# Patient Record
Sex: Male | Born: 1950 | Hispanic: No | Smoking: Former smoker
Health system: Southern US, Community
[De-identification: ages and names within clinical notes are randomized; demographics above are authoritative.]

## PROBLEM LIST (undated history)

## (undated) DIAGNOSIS — I10 Essential (primary) hypertension: Secondary | ICD-10-CM

## (undated) DIAGNOSIS — R569 Unspecified convulsions: Secondary | ICD-10-CM

## (undated) DIAGNOSIS — K219 Gastro-esophageal reflux disease without esophagitis: Secondary | ICD-10-CM

## (undated) DIAGNOSIS — K635 Polyp of colon: Secondary | ICD-10-CM

## (undated) DIAGNOSIS — K5730 Diverticulosis of large intestine without perforation or abscess without bleeding: Secondary | ICD-10-CM

## (undated) HISTORY — DX: Diverticulosis of large intestine without perforation or abscess without bleeding: K57.30

## (undated) HISTORY — DX: Polyp of colon: K63.5

---

## 2007-08-07 ENCOUNTER — Emergency Department (HOSPITAL_COMMUNITY): Admission: EM | Admit: 2007-08-07 | Discharge: 2007-08-08 | Payer: Self-pay | Admitting: Emergency Medicine

## 2009-04-29 ENCOUNTER — Emergency Department (HOSPITAL_COMMUNITY): Admission: EM | Admit: 2009-04-29 | Discharge: 2009-04-30 | Payer: Self-pay | Admitting: Emergency Medicine

## 2009-05-15 ENCOUNTER — Emergency Department (HOSPITAL_COMMUNITY): Admission: EM | Admit: 2009-05-15 | Discharge: 2009-05-15 | Payer: Self-pay | Admitting: Emergency Medicine

## 2010-09-04 ENCOUNTER — Emergency Department (HOSPITAL_COMMUNITY): Payer: Self-pay

## 2010-09-04 ENCOUNTER — Emergency Department (HOSPITAL_COMMUNITY)
Admission: EM | Admit: 2010-09-04 | Discharge: 2010-09-04 | Disposition: A | Discharge status: Home / Self Care | Payer: Self-pay | Attending: Emergency Medicine | Admitting: Emergency Medicine

## 2010-09-04 DIAGNOSIS — R569 Unspecified convulsions: Secondary | ICD-10-CM | POA: Insufficient documentation

## 2010-09-04 DIAGNOSIS — F29 Unspecified psychosis not due to a substance or known physiological condition: Secondary | ICD-10-CM | POA: Insufficient documentation

## 2010-09-04 DIAGNOSIS — I498 Other specified cardiac arrhythmias: Secondary | ICD-10-CM | POA: Insufficient documentation

## 2010-09-04 LAB — COMPREHENSIVE METABOLIC PANEL
AST: 31 U/L (ref 0–37)
Albumin: 3.8 g/dL (ref 3.5–5.2)
Creatinine, Ser: 0.71 mg/dL (ref 0.50–1.35)
GFR calc non Af Amer: >60 mL/min (ref >60)
Glucose, Bld: 120 mg/dL — ABNORMAL HIGH (ref 70–99)
Potassium: 3.9 mEq/L (ref 3.5–5.1)
Sodium: 140 mEq/L (ref 135–145)
Total Bilirubin: 0.3 mg/dL (ref 0.3–1.2)
Total Protein: 7.5 g/dL (ref 6.0–8.3)

## 2010-09-04 LAB — DIFFERENTIAL
Basophils Absolute: 0 10*3/uL (ref 0.0–0.1)
Basophils Relative: 0 % (ref 0–1)
Lymphocytes Relative: 26 % (ref 12–46)
Monocytes Relative: 7 % (ref 3–12)
Neutro Abs: 5.4 10*3/uL (ref 1.7–7.7)
Neutrophils Relative %: 59 % (ref 43–77)

## 2010-09-04 LAB — CBC
Hemoglobin: 12.6 g/dL — ABNORMAL LOW (ref 13.0–17.0)
MCH: 31.4 pg (ref 26.0–34.0)
MCHC: 34.6 g/dL (ref 30.0–36.0)
MCV: 90.8 fL (ref 78.0–100.0)
Platelets: 320 10*3/uL (ref 150–400)
RBC: 4.01 MIL/uL — ABNORMAL LOW (ref 4.22–5.81)

## 2010-09-04 LAB — GLUCOSE, CAPILLARY

## 2010-12-12 LAB — DIFFERENTIAL
Basophils Absolute: 0
Basophils Relative: 0
Eosinophils Absolute: 0.3
Lymphocytes Relative: 44
Neutro Abs: 2.9
Neutrophils Relative %: 45

## 2010-12-12 LAB — RAPID URINE DRUG SCREEN, HOSP PERFORMED
Amphetamines: NOT DETECTED
Benzodiazepines: NOT DETECTED
Cocaine: NOT DETECTED
Opiates: NOT DETECTED
Tetrahydrocannabinol: NOT DETECTED

## 2010-12-12 LAB — CBC
HCT: 39.2
MCHC: 34
MCV: 92.9
RBC: 4.22
WBC: 6.6

## 2010-12-12 LAB — POCT I-STAT, CHEM 8
BUN: 9
Creatinine, Ser: 1
Hemoglobin: 14.3
Potassium: 3.9
Sodium: 145

## 2011-05-02 ENCOUNTER — Encounter (HOSPITAL_COMMUNITY): Payer: Self-pay | Admitting: Emergency Medicine

## 2011-05-02 ENCOUNTER — Emergency Department (HOSPITAL_COMMUNITY): Payer: Self-pay

## 2011-05-02 ENCOUNTER — Other Ambulatory Visit: Payer: Self-pay

## 2011-05-02 ENCOUNTER — Emergency Department (HOSPITAL_COMMUNITY)
Admission: EM | Admit: 2011-05-02 | Discharge: 2011-05-03 | Disposition: A | Discharge status: Home / Self Care | Payer: Self-pay | Attending: Emergency Medicine | Admitting: Emergency Medicine

## 2011-05-02 DIAGNOSIS — R42 Dizziness and giddiness: Secondary | ICD-10-CM | POA: Insufficient documentation

## 2011-05-02 DIAGNOSIS — K625 Hemorrhage of anus and rectum: Secondary | ICD-10-CM | POA: Insufficient documentation

## 2011-05-02 DIAGNOSIS — R1011 Right upper quadrant pain: Secondary | ICD-10-CM | POA: Insufficient documentation

## 2011-05-02 DIAGNOSIS — D649 Anemia, unspecified: Secondary | ICD-10-CM | POA: Insufficient documentation

## 2011-05-02 LAB — DIFFERENTIAL
Basophils Absolute: 0 10*3/uL (ref 0.0–0.1)
Eosinophils Relative: 3 % (ref 0–5)
Lymphocytes Relative: 29 % (ref 12–46)
Lymphs Abs: 2.1 10*3/uL (ref 0.7–4.0)
Monocytes Relative: 4 % (ref 3–12)
Neutro Abs: 4.5 10*3/uL (ref 1.7–7.7)

## 2011-05-02 LAB — CBC
MCHC: 34.5 g/dL (ref 30.0–36.0)
MCV: 89.8 fL (ref 78.0–100.0)
Platelets: 247 10*3/uL (ref 150–400)
RBC: 1.87 MIL/uL — ABNORMAL LOW (ref 4.22–5.81)
WBC: 7.2 10*3/uL (ref 4.0–10.5)

## 2011-05-02 LAB — COMPREHENSIVE METABOLIC PANEL
ALT: 8 U/L (ref 0–53)
AST: 11 U/L (ref 0–37)
Albumin: 3.2 g/dL — ABNORMAL LOW (ref 3.5–5.2)
Alkaline Phosphatase: 56 U/L (ref 39–117)
BUN: 18 mg/dL (ref 6–23)
Chloride: 105 mEq/L (ref 96–112)
Potassium: 3.6 mEq/L (ref 3.5–5.1)
Total Bilirubin: 0.2 mg/dL — ABNORMAL LOW (ref 0.3–1.2)

## 2011-05-02 LAB — PROTIME-INR: INR: 1.05 (ref 0.00–1.49)

## 2011-05-02 MED ORDER — SODIUM CHLORIDE 0.9 % IV SOLN
Freq: Once | INTRAVENOUS | Status: AC
  Administered 2011-05-02: 20 mL/h via INTRAVENOUS

## 2011-05-02 MED ORDER — IOHEXOL 300 MG/ML  SOLN
100.0000 mL | Freq: Once | INTRAMUSCULAR | Status: AC | PRN
  Administered 2011-05-02: 100 mL via INTRAVENOUS

## 2011-05-02 NOTE — ED Notes (Signed)
IV team paged.  

## 2011-05-02 NOTE — ED Notes (Signed)
Pt here for rectal bleeding and abd pain starting yesterday; pt poor historian; pt sts some dizziness and appears pale

## 2011-05-02 NOTE — ED Provider Notes (Signed)
History     CSN: [id number]  Arrival date & time 05/02/11  1759   First MD Initiated Contact with Patient 05/02/11 1936      Chief Complaint  Patient presents with  . Rectal Bleeding  . Dizziness    (Consider location/radiation/quality/duration/timing/severity/associated sxs/prior treatment) HPI Comments: Patient states he developed left lower abdominal pain and bloody bowel movements for the past 2, days.  He's been feeling dizzy.  I talked with him extensively about transfusion.  He adamantly states, "I don't want any foreign blood in me"  Patient is a 61 y.o. male presenting with hematochezia. The history is provided by the patient.  Rectal Bleeding  The current episode started 2 days ago. The problem occurs frequently. The problem has been unchanged. The pain is moderate. The stool is described as bloody. Associated symptoms include abdominal pain. Pertinent negatives include no fever, no diarrhea, no nausea, no rectal pain, no vomiting, no chest pain and no headaches.    History reviewed. No pertinent past medical history.  History reviewed. No pertinent past surgical history.  History reviewed. No pertinent family history.  History  Substance Use Topics  . Smoking status: Not on file  . Smokeless tobacco: Not on file  . Alcohol Use: Not on file      Review of Systems  Constitutional: Negative for fever.  Respiratory: Negative for shortness of breath.   Cardiovascular: Negative for chest pain.  Gastrointestinal: Positive for abdominal pain, blood in stool and hematochezia. Negative for nausea, vomiting, diarrhea, abdominal distention and rectal pain.  Genitourinary: Negative for dysuria.  Skin: Positive for pallor.  Neurological: Positive for dizziness. Negative for headaches.    Allergies  Review of patient's allergies indicates no known allergies.  Home Medications  No current outpatient prescriptions on file.  BP 143/75  Pulse 94  Temp(Src) 98 F (36.7  C) (Oral)  Resp 16  SpO2 100%  Physical Exam  Constitutional: He is oriented to person, place, and time. He appears well-developed and well-nourished.  HENT:  Head: Normocephalic.  Eyes: Pupils are equal, round, and reactive to light.       Pale conjuntiva  Neck: Normal range of motion.  Cardiovascular: Normal rate.   Pulmonary/Chest: Effort normal.  Abdominal: Soft. Bowel sounds are normal. He exhibits no distension and no pulsatile liver. There is no hepatosplenomegaly. There is tenderness in the left lower quadrant. There is no rigidity, no rebound and no CVA tenderness.  Musculoskeletal: Normal range of motion.  Neurological: He is alert and oriented to person, place, and time.  Skin: Skin is warm. There is pallor.       Pale palms    ED Course  Procedures (including critical care time)  Labs Reviewed  CBC - Abnormal; Notable for the following:    RBC 1.87 (*)    Hemoglobin 5.8 (*)    HCT 16.8 (*)    All other components within normal limits  COMPREHENSIVE METABOLIC PANEL - Abnormal; Notable for the following:    Glucose, Bld 180 (*)    Total Protein 5.8 (*)    Albumin 3.2 (*)    Total Bilirubin 0.2 (*)    All other components within normal limits  DIFFERENTIAL  PROTIME-INR  TYPE AND SCREEN  OCCULT BLOOD, POC DEVICE  ABO/RH   Ct Abdomen Pelvis W Contrast  05/02/2011  *RADIOLOGY REPORT*  Clinical Data: Rectal bleeding.  Abdominal pain.  Anemia.  CT ABDOMEN AND PELVIS WITH CONTRAST  Technique:  Multidetector CT imaging of  the abdomen and pelvis was performed following the standard protocol during bolus administration of intravenous contrast.  Contrast: OMNIPAQUE IOHEXOL 300 MG/ML IV SOLN  Comparison: None.  Findings: The lung bases are clear without focal nodule, mass, or airspace disease.  Heart size is normal.  No significant pleural or pericardial effusion is present.  The liver and spleen are within normal limits.  The stomach, duodenum, and pancreas are normal.   The common bile duct and gallbladder are within normal limits. The adrenal glands are normal bilaterally.  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 Bilateral simple cysts are present in the kidneys.  The ureters are within normal limits throughout their course to the urinary bladder.  The wall of the urinary bladder appears diffusely thickened.  No discrete mass lesion is evident.  The rectum is unremarkable.  No significant adenopathy is present. The sigmoid colon is within normal limits.  The remainder of the colon is within normal limits as well.  The appendix is visualized and normal.  The small bowel is unremarkable.  No significant adenopathy or free fluid is present.  Minimal atherosclerotic calcifications are noted.  The bone windows are within normal limits.  IMPRESSION:  1.  No significant rectal mass or adenopathy is present. 2.  Diffuse thickening of the bladder wall.  This could be seen with in the setting of cystitis or urinary tract infection. 3.  Bilateral renal cysts. 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  Original Report Authenticated By: Jamesetta Orleans. MATTERN, M.D.   Discussed at length.  Patient's need for blood transfusion.  He adamantly refuses again, stating he does not want any foreign or blood in his body.  Explained that he could die if he leaves and doesn't get treatment.  He refuses to be admitted stating that he will come back if he feels bad  1. Rectal bleeding   2. Anemia        MDM   Rectal bleeding       Arman Filter, NP 05/02/11 2341  Arman Filter, NP 05/02/11 2343  Arman Filter, NP 05/02/11 [phone]

## 2011-05-02 NOTE — ED Notes (Signed)
RN spoke to pt about receiving a blood infusion because of critically low hemoglobin and hematocrit. Pt still refusing blood and additional treatment, states he wants to leave AMA. RN warned patient of the possible medical consequences including death, pt states he understands and still wants to leave.

## 2011-05-02 NOTE — Discharge Instructions (Signed)
Anemia, Nonspecific Your exam and blood tests show you are anemic. This means your blood (hemoglobin) level is low. Normal hemoglobin values are 12 to 15 g/dL for females and 14 to 17 g/dL for males. Make a note of your hemoglobin level today. The hematocrit percent is also used to measure anemia. A normal hematocrit is 38% to 46% in females and 42% to 49% in males. Make a note of your hematocrit level today. CAUSES  Anemia can be due to many different causes.  Excessive bleeding from periods (in women).   Intestinal bleeding.   Poor nutrition.   Kidney, thyroid, liver, and bone marrow diseases.  SYMPTOMS  Anemia can come on suddenly (acute). It can also come on slowly. Symptoms can include:  Minor weakness.   Dizziness.   Palpitations.   Shortness of breath.  Symptoms may be absent until half your hemoglobin is missing if it comes on slowly. Anemia due to acute blood loss from an injury or internal bleeding may require blood transfusion if the loss is severe. Hospital care is needed if you are anemic and there is significant continual blood loss. TREATMENT   Stool tests for blood (Hemoccult) and additional lab tests are often needed. This determines the best treatment.   Further checking on your condition and your response to treatment is very important. It often takes many weeks to correct anemia.  Depending on the cause, treatment can include:  Supplements of iron.   Vitamins B12 and folic acid.   Hormone medicines.If your anemia is due to bleeding, finding the cause of the blood loss is very important. This will help avoid further problems.  SEEK IMMEDIATE MEDICAL CARE IF:   You develop fainting, extreme weakness, shortness of breath, or chest pain.   You develop heavy vaginal bleeding.   You develop bloody or black, tarry stools or vomit up blood.   You develop a high fever, rash, repeated vomiting, or dehydration.  Document Released: 04/11/2004 Document Revised:  11/14/2010 Document Reviewed: 01/17/2009 [facility] Patient Information 2012 [place], [place].Bloody Stools Bloody stools means there is blood in your poop (stool). It is a sign that there is a problem somewhere in the digestive system. It is important for your doctor to find the cause of your bleeding, so the problem can be treated.  HOME CARE  Only take medicine as told by your doctor.   Eat foods with fiber (prunes, bran cereals).   Drink enough fluids to keep your pee (urine) clear or pale yellow.   Sit in warm water (sitz bath) for 10 to 15 minutes as told by your doctor.   Know how to take your medicines (enemas, suppositories) if advised by your doctor.   Watch for signs that you are getting better or getting worse.  GET HELP RIGHT AWAY IF:   You are not getting better.   You start to get better but then get worse again.   You have new problems.   You have severe bleeding from the place where poop comes out (rectum) that does not stop.   You throw up (vomit) blood.   You feel weak or pass out (faint).   You have a fever.  MAKE SURE YOU:   Understand these instructions.   Will watch your condition.   Will get help right away if you are not doing well or get worse.  Document Released: 02/20/2009 Document Revised: 11/14/2010 Document Reviewed: 07/20/2010 [facility] Patient Information 2012 [place], [place].  please return at any time if  he becomes dizzy, weak and fainting spells, change her mind about receiving a blood transfusion

## 2011-05-03 ENCOUNTER — Other Ambulatory Visit: Payer: Self-pay

## 2011-05-03 ENCOUNTER — Encounter (HOSPITAL_COMMUNITY): Payer: Self-pay

## 2011-05-03 ENCOUNTER — Inpatient Hospital Stay (HOSPITAL_COMMUNITY)
Admission: EM | Admit: 2011-05-03 | Discharge: 2011-05-07 | DRG: 811 | Disposition: A | Discharge status: Home / Self Care | Payer: Self-pay | Attending: Internal Medicine | Admitting: Internal Medicine

## 2011-05-03 ENCOUNTER — Emergency Department (HOSPITAL_COMMUNITY): Payer: Self-pay

## 2011-05-03 DIAGNOSIS — R079 Chest pain, unspecified: Secondary | ICD-10-CM | POA: Diagnosis present

## 2011-05-03 DIAGNOSIS — R0789 Other chest pain: Secondary | ICD-10-CM | POA: Diagnosis present

## 2011-05-03 DIAGNOSIS — K5731 Diverticulosis of large intestine without perforation or abscess with bleeding: Secondary | ICD-10-CM | POA: Diagnosis present

## 2011-05-03 DIAGNOSIS — R569 Unspecified convulsions: Secondary | ICD-10-CM | POA: Diagnosis present

## 2011-05-03 DIAGNOSIS — K644 Residual hemorrhoidal skin tags: Secondary | ICD-10-CM | POA: Diagnosis present

## 2011-05-03 DIAGNOSIS — K922 Gastrointestinal hemorrhage, unspecified: Secondary | ICD-10-CM | POA: Diagnosis present

## 2011-05-03 DIAGNOSIS — D72829 Elevated white blood cell count, unspecified: Secondary | ICD-10-CM | POA: Diagnosis present

## 2011-05-03 DIAGNOSIS — D126 Benign neoplasm of colon, unspecified: Secondary | ICD-10-CM | POA: Diagnosis present

## 2011-05-03 DIAGNOSIS — D62 Acute posthemorrhagic anemia: Principal | ICD-10-CM | POA: Diagnosis present

## 2011-05-03 DIAGNOSIS — R000 Tachycardia, unspecified: Secondary | ICD-10-CM | POA: Diagnosis present

## 2011-05-03 DIAGNOSIS — I10 Essential (primary) hypertension: Secondary | ICD-10-CM | POA: Diagnosis present

## 2011-05-03 DIAGNOSIS — K648 Other hemorrhoids: Secondary | ICD-10-CM | POA: Diagnosis present

## 2011-05-03 HISTORY — DX: Unspecified convulsions: R56.9

## 2011-05-03 LAB — TYPE AND SCREEN
ABO/RH(D): A POS
Antibody Screen: NEGATIVE
Unit division: 0

## 2011-05-03 LAB — CBC
HCT: 16.3 % — ABNORMAL LOW (ref 39.0–52.0)
Hemoglobin: 5.7 g/dL — CL (ref 13.0–17.0)
MCH: 31.7 pg (ref 26.0–34.0)
MCHC: 35.3 g/dL (ref 30.0–36.0)
MCV: 90.6 fL (ref 78.0–100.0)
Platelets: 298 10*3/uL (ref 150–400)
RBC: 1.8 MIL/uL — ABNORMAL LOW (ref 4.22–5.81)
RDW: 13.4 % (ref 11.5–15.5)
WBC: 16.4 10*3/uL — ABNORMAL HIGH (ref 4.0–10.5)

## 2011-05-03 LAB — BASIC METABOLIC PANEL
BUN: 15 mg/dL (ref 6–23)
Creatinine, Ser: 0.9 mg/dL (ref 0.50–1.35)
GFR calc non Af Amer: 89 mL/min — ABNORMAL LOW (ref >90)
Glucose, Bld: 146 mg/dL — ABNORMAL HIGH (ref 70–99)
Potassium: 3.5 mEq/L (ref 3.5–5.1)

## 2011-05-03 LAB — COMPREHENSIVE METABOLIC PANEL
ALT: 10 U/L (ref 0–53)
Albumin: 3.4 g/dL — ABNORMAL LOW (ref 3.5–5.2)
Alkaline Phosphatase: 58 U/L (ref 39–117)
Chloride: 98 mEq/L (ref 96–112)
Glucose, Bld: 137 mg/dL — ABNORMAL HIGH (ref 70–99)
Potassium: 3.4 mEq/L — ABNORMAL LOW (ref 3.5–5.1)
Sodium: 134 mEq/L — ABNORMAL LOW (ref 135–145)
Total Bilirubin: 0.1 mg/dL — ABNORMAL LOW (ref 0.3–1.2)
Total Protein: 6.2 g/dL (ref 6.0–8.3)

## 2011-05-03 LAB — DIFFERENTIAL
Basophils Absolute: 0 10*3/uL (ref 0.0–0.1)
Basophils Relative: 0 % (ref 0–1)
Eosinophils Absolute: 0 10*3/uL (ref 0.0–0.7)
Eosinophils Relative: 0 % (ref 0–5)
Lymphs Abs: 1.8 10*3/uL (ref 0.7–4.0)
Monocytes Absolute: 0.8 10*3/uL (ref 0.1–1.0)
Monocytes Absolute: 0.9 10*3/uL (ref 0.1–1.0)
Monocytes Relative: 5 % (ref 3–12)
Neutro Abs: 14.2 10*3/uL — ABNORMAL HIGH (ref 1.7–7.7)
Neutrophils Relative %: 82 % — ABNORMAL HIGH (ref 43–77)
Neutrophils Relative %: 87 % — ABNORMAL HIGH (ref 43–77)

## 2011-05-03 LAB — PROTIME-INR
INR: 1.06 (ref 0.00–1.49)
Prothrombin Time: 14 seconds (ref 11.6–15.2)

## 2011-05-03 LAB — MRSA PCR SCREENING: MRSA by PCR: NEGATIVE

## 2011-05-03 LAB — APTT: aPTT: 26 seconds (ref 24–37)

## 2011-05-03 LAB — CARDIAC PANEL(CRET KIN+CKTOT+MB+TROPI): Troponin I: <0.3 ng/mL (ref <0.30)

## 2011-05-03 MED ORDER — PANTOPRAZOLE SODIUM 40 MG IV SOLR
40.0000 mg | Freq: Two times a day (BID) | INTRAVENOUS | Status: DC
  Administered 2011-05-03 – 2011-05-06 (×6): 40 mg via INTRAVENOUS
  Filled 2011-05-03 (×9): qty 40

## 2011-05-03 MED ORDER — LORAZEPAM 2 MG/ML IJ SOLN: 1.0000 mg | Freq: Four times a day (QID) | INTRAMUSCULAR | Status: DC | PRN

## 2011-05-03 MED ORDER — SODIUM CHLORIDE 0.9 % IV SOLN
INTRAVENOUS | Status: DC
  Administered 2011-05-03 (×2): via INTRAVENOUS
  Administered 2011-05-05: 1000 mL via INTRAVENOUS
  Administered 2011-05-06: 50 mL/h via INTRAVENOUS
  Administered 2011-05-06 (×2): 100 mL/h via INTRAVENOUS
  Administered 2011-05-07: 1000 mL via INTRAVENOUS

## 2011-05-03 MED ORDER — ACETAMINOPHEN 325 MG PO TABS
650.0000 mg | ORAL_TABLET | Freq: Four times a day (QID) | ORAL | Status: DC | PRN
  Administered 2011-05-03: 650 mg via ORAL
  Filled 2011-05-03: qty 2

## 2011-05-03 MED ORDER — SODIUM CHLORIDE 0.9 % IJ SOLN
3.0000 mL | Freq: Two times a day (BID) | INTRAMUSCULAR | Status: DC
  Administered 2011-05-03: 3 mL via INTRAVENOUS

## 2011-05-03 MED ORDER — CIPROFLOXACIN IN D5W 400 MG/200ML IV SOLN
400.0000 mg | Freq: Two times a day (BID) | INTRAVENOUS | Status: DC
  Administered 2011-05-03 – 2011-05-05 (×4): 400 mg via INTRAVENOUS
  Filled 2011-05-03 (×6): qty 200

## 2011-05-03 NOTE — ED Notes (Signed)
Unable to start IV. Pt can not follow commands at this time and jerks arm.

## 2011-05-03 NOTE — ED Notes (Signed)
Called [phone] to give report. Nurse unavailable. Awaiting return call.

## 2011-05-03 NOTE — ED Provider Notes (Signed)
Medical screening examination/treatment/procedure(s) were conducted as a shared visit with non-physician practitioner(s) and myself.  I personally evaluated the patient during the encounter  Pt seen and evaluted, pt presenting with abdominal pain and rectal bleeding- states this has been occurring x 3 days.  Pt found to be significantly anemic- long discussion with patient about need for blood transfusion and potentially life threatening nature of a GI bleed- pt initially refused CT scan, but then agreed- but on reassessment continued to refuse admission and PRBCs- he was signed out AMA and was encouraged to return to the ED if he changes his mind about treatment  Ethelda Chick, MD 05/03/11 [phone]

## 2011-05-03 NOTE — ED Notes (Signed)
Patient presents with initial request needing a transfusion. Per old chart it seemed that patient needed a blood transfusion but had left ama from the hospital. Nursing had called him multiple times in triage but was unable to locate him in the department. Pt was called for in triage and out in the general waiting area. No response. The patient had seated himself in the area for patients that had already been triaged. Nursing had called out for his name in this area but he did not respond to that call either. Nursing was triaging another patient and came out of the triage room to see that he was having what appeared to be a seizure. He was unresponsive and it seemed that he may have bitten his tongue. Pt had fallen onto the floor. He was picked up and moved onto a stretcher and then taken back to the trauma room. Nursing unable to fully triage him because he was unable to be located and then had a seizure and was unable to communicate with staff.

## 2011-05-03 NOTE — Progress Notes (Signed)
Called to ER 8 for iv start;  Staff has attempted x 3, per RN;  Very poor peripheral access!  Attempted x 1, also without success; suggest central access for this pt; RN and MD both aware;

## 2011-05-03 NOTE — Progress Notes (Signed)
Pt currently has no IV access. IV nurse notified and said that two IV nurses attempted to gain access in ED with no success. NP Lynch notified. Plan to place pt on PICC list. Will continue to monitor pt.

## 2011-05-03 NOTE — ED Notes (Signed)
MD attempted x 2 to place IV. Unsuccessful.

## 2011-05-03 NOTE — ED Notes (Signed)
Pt attempting to get up out of bed. Pt had disconnected BP cuff and heart monitor leads. Counselled pt on getting up unassisted. Explained importance of asking for staff assistance for help getting out of bed and also about not removing bp cuff and heart monitor.

## 2011-05-03 NOTE — ED Notes (Signed)
INT attempted x 2. Unsuccessful. Pt uncooperative with care at this time. IV team called.

## 2011-05-03 NOTE — ED Notes (Signed)
IV team at bedside. Unable to obtain IV accesss. Md aware.

## 2011-05-03 NOTE — ED Provider Notes (Signed)
History     CSN: [id number]  Arrival date & time 05/03/11  1516   First MD Initiated Contact with Patient 05/03/11 1609      Chief Complaint  Patient presents with  . Altered Mental Status    (Consider location/radiation/quality/duration/timing/severity/associated sxs/prior treatment) HPI Comments: 61yo male here with anemia and bloody stools. Was seen yesterday, found to have Hb of 5.8 and left AMA. Returned today requesting transfusion. Noted to have grand mal seizure in triage. Did have seizure in June 2012 at which time head CT was neg.   Patient is a 61 y.o. male presenting with seizures.  Seizures  This is a recurrent problem. The current episode started less than 1 hour ago. The problem has been gradually improving. There was 1 seizure. Duration: unknown, pt found in triage to be having seizure. Characteristics include rhythmic jerking and loss of consciousness. The episode was witnessed. The seizures did not continue in the ED. The seizure(s) had no focality. There has been no fever. Associated symptoms comments: Has had abdominal pain with GI bleeding for past two days according to records. Left AMA yesterday with Hb of 5.8. .    History reviewed. No pertinent past medical history.  History reviewed. No pertinent past surgical history.  History reviewed. No pertinent family history.  History  Substance Use Topics  . Smoking status: Not on file  . Smokeless tobacco: Not on file  . Alcohol Use: Not on file      Review of Systems  Unable to perform ROS: Mental status change  Neurological: Positive for seizures and loss of consciousness.    Allergies  Review of patient's allergies indicates no known allergies.  Home Medications  No current outpatient prescriptions on file.  BP 137/57  Pulse 111  Resp 28  SpO2 97%  Physical Exam  Nursing note and vitals reviewed. Constitutional: He appears well-developed and well-nourished. No distress.       Lying in bed  with eyes closed   HENT:  Head: Normocephalic and atraumatic.  Right Ear: External ear normal.  Left Ear: External ear normal.  Mouth/Throat: Oropharynx is clear and moist.       Small amount of dried blood at right edge of lip  Eyes: Pupils are equal, round, and reactive to light.  Neck: Neck supple.  Cardiovascular: Regular rhythm and intact distal pulses.  Exam reveals no gallop and no friction rub.   Murmur (II/VI SEM heard best at right sternal border) heard.      tachycardic   Pulmonary/Chest: Effort normal and breath sounds normal. No respiratory distress. He has no wheezes. He has no rales.  Abdominal: Soft. There is no tenderness. There is no rebound and no guarding.  Musculoskeletal: Normal range of motion. He exhibits no edema and no tenderness.  Lymphadenopathy:    He has no cervical adenopathy.  Neurological: He is alert. GCS eye subscore is 3. GCS verbal subscore is 1. GCS motor subscore is 4.       Opens eyes to voice. Does not follow commands.   Skin: Skin is warm and dry. No rash noted. No erythema.  Psychiatric: He has a normal mood and affect. His behavior is normal.    ED Course  Procedures (including critical care time) US guided IV: MD called to bedside due to inability to establish IV access. 20g IV placed in right basilic vein via US guidance. IV line drew back and flushed well. Patient tolerated procedure well.   Results for orders  placed during the hospital encounter of 05/03/11  CBC      Component Value Range   WBC 14.8 (*) 4.0 - 10.5 (K/uL)   RBC 1.80 (*) 4.22 - 5.81 (MIL/uL)   Hemoglobin 5.7 (*) 13.0 - 17.0 (g/dL)   HCT 16.1 (*) 09.6 - 52.0 (%)   MCV 90.6  78.0 - 100.0 (fL)   MCH 31.7  26.0 - 34.0 (pg)   MCHC 35.0  30.0 - 36.0 (g/dL)   RDW 04.5  40.9 - 81.1 (%)   Platelets 279  150 - 400 (K/uL)  DIFFERENTIAL      Component Value Range   Neutrophils Relative 82 (*) 43 - 77 (%)   Neutro Abs 12.2 (*) 1.7 - 7.7 (K/uL)   Lymphocytes Relative 12  12  - 46 (%)   Lymphs Abs 1.8  0.7 - 4.0 (K/uL)   Monocytes Relative 5  3 - 12 (%)   Monocytes Absolute 0.8  0.1 - 1.0 (K/uL)   Eosinophils Relative 0  0 - 5 (%)   Eosinophils Absolute 0.0  0.0 - 0.7 (K/uL)   Basophils Relative 0  0 - 1 (%)   Basophils Absolute 0.0  0.0 - 0.1 (K/uL)  BASIC METABOLIC PANEL      Component Value Range   Sodium 137  135 - 145 (mEq/L)   Potassium 3.5  3.5 - 5.1 (mEq/L)   Chloride 100  96 - 112 (mEq/L)   CO2 19  19 - 32 (mEq/L)   Glucose, Bld 146 (*) 70 - 99 (mg/dL)   BUN 15  6 - 23 (mg/dL)   Creatinine, Ser 9.14  0.50 - 1.35 (mg/dL)   Calcium 8.6  8.4 - 78.2 (mg/dL)   GFR calc non Af Amer 89 (*) >90 (mL/min)   GFR calc Af Amer >90  >90 (mL/min)  PREPARE RBC (CROSSMATCH)      Component Value Range   Order Confirmation ORDER PROCESSED BY BLOOD BANK    GLUCOSE, CAPILLARY      Component Value Range   Glucose-Capillary 139 (*) 70 - 99 (mg/dL)  TYPE AND SCREEN      Component Value Range   ABO/RH(D) A POS     Antibody Screen NEG     Sample Expiration 05/06/2011     Unit Number [id number]     Blood Component Type RED CELLS,LR     Unit division 00     Status of Unit ISSUED     Transfusion Status OK TO TRANSFUSE     Crossmatch Result Compatible  Ct Abdomen Pelvis W Contrast  05/02/2011  *RADIOLOGY REPORT*  Clinical Data: Rectal bleeding.  Abdominal pain.  Anemia.  CT ABDOMEN AND PELVIS WITH CONTRAST  Technique:  Multidetector CT imaging of the abdomen and pelvis was performed following the standard protocol during bolus administration of intravenous contrast.  Contrast: OMNIPAQUE IOHEXOL 300 MG/ML IV  SOLN  Comparison: None.  Findings: The lung bases are clear without focal nodule, mass, or airspace disease.  Heart size is normal.  No significant pleural or pericardial effusion is present.  The liver and spleen are within normal limits.  The stomach, duodenum, and pancreas are normal.  The common bile duct and gallbladder are within normal limits. The adrenal glands are normal bilaterally.  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 Bilateral simple cysts are present in the kidneys.  The ureters are within normal limits throughout their course to the urinary bladder.  The wall of the urinary bladder appears diffusely thickened.  No discrete mass lesion is evident.  The rectum is unremarkable.  No significant adenopathy is present. The sigmoid colon is within normal limits.  The remainder of the colon is within normal limits as well.  The appendix is visualized and normal.  The small bowel is unremarkable.  No significant adenopathy or free fluid is present.  Minimal atherosclerotic calcifications are noted.  The bone windows are within normal limits.  IMPRESSION:  1.  No significant rectal mass or adenopathy is present. 2.  Diffuse thickening of the bladder wall.  This could be seen with in the setting of cystitis or urinary tract infection. 3.  Bilateral renal cysts. 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  Original Report Authenticated By: Jamesetta Orleans. MATTERN, M.D.    Date: 05/04/2011  Rate: 108  Rhythm: normal sinus rhythm  QRS Axis: normal  Intervals: normal  ST/T Wave abnormalities: normal  Conduction Disutrbances:none  Narrative Interpretation:       1. Chest pain   2. Anemia due to acute blood loss    MDM  18:35 PM 61 year old male, it was seen here yesterday with GI bleed and anemia with a hemoglobin of 5.8 initial presenting needing transfusion. Patient did have a grand mal seizure while in the waiting room. He is currently post ictal period he had a CT scan done  yesterday which shows a possible cystitis that was otherwise unremarkable. At this time he refused blood transfusion and left AMA. He apparently presented with abdominal pain for 2 days' duration with bloody stools and weakness. Is tachycardic to 120 the blood pressure is stable. He opened his eyes to voice but does not follow any commands. We'll recheck CBC BMP and arrange for blood transfusion.  US guided IV placed by MD. Pt still anemic at 5.7. He has remained HDS and has recovered from his seizure. No AAOx3 and interactive. Noted to have seizure in June 2012 at which time he had CT head which was negative. Neuro exam is now non focal. Transfusion ordered and medicine consulted. GI also contacted who will follow patient. Pt admitted for further management.        Sheran Luz, MD 05/04/11 0981  Sheran Luz, MD 05/04/11 [phone]

## 2011-05-03 NOTE — ED Notes (Signed)
Pt brought back to room 17 after witness grand mal seizure in waiting room. Pt is postictal at this time. Pt placed on monitor. Airway intact.

## 2011-05-03 NOTE — H&P (Addendum)
PCP:  No primary provider on file.   DOA:  05/03/2011  3:51 PM  Chief Complaint:  Anemia, chest pain  HPI: Patient is a pleasant 61 year old male with no significant past medical history, was just recently in emergency room (05/02/2011) and was found with a hemoglobin of 5.8 however before transfusion was started patient's left hospital AGAINST MEDICAL ADVICE. He now comes back with complaints of chest pain, left-sided and right-sided, 8/10 in intensity, at rest, not radiating. Patient reports that this pain started on the morning of admission to emergency room. Patient also reports that chest pain is aggravated by breathing and there are no alleviating factors. Patient reports no shortness of breath, no fevers or chills at home. As per patient he had bright red blood per rectum on Monday (04/29/2011) and Wednesday (05/01/11) but hasn't had any bleeding episode since then. He also reports that he felt dizzy at the time of bleed but at present he has no such complaints. There are no complaints of abdominal pain, nausea, vomiting. As per patient he has never had a colonoscopy in the past While patient was in triage he has had one episode of tonic-clonic seizure, unknown duration as per ED physician. Patient had no tongue biting or urinary incontinence. He was post ictal at that time ED physician saw him but at present patient is alert, awake and oriented.  Assessment and plan  Principal Problem:   *Acute blood loss anemia - Likely secondary to lower GI bleed - Hemoglobin on admission 5.7 - Patient will receive 2 units of PRBC in emergency room - Followup posttransfusion CBC - GI was consulted by ED physician - Provide IV fluids, normal saline at 100 cc an hour  Active Problems:   Acute lower GI bleeding - Unclear etiology, patient had a CAT scan abdomen and pelvis on May 02 2011 but has not revealed any significant findings that would point to the source of GI bleed - Start Protonix 40  mg IV twice a day - Followup on GI recommendations, possible colonoscopy - Provide IV fluids, normal saline at 100 cc an hour   Seizure - As per patient this is the very first episode of seizure activity - Obtain EEG and urine drug screen - We will hold off on antiepileptic medications for now however if EEG shows any significant epileptic activity then consider urology consult - Start Ativan IV when necessary for seizures - Seizure precaution   Tachycardia - Likely secondary to anemia - Admit to telemetry and   Leukocytosis - Patient is afebrile with white blood cells of 14.8 - At this time the source of leukocytosis is unknown - Followup urinalysis and blood cultures - Hold off on antibiotics as no obvious source of infection at present   Chest pain - Atypical, perhaps musculoskeletal - Rule out ACS or even PE - obtain d dimer - Cycle cardiac enzymes, lipid panel, TSH, hemoglobin A1c - Obtain 2-D echo  Disposition - Admit to telemetry  Education - Patient is aware of plan of care and treatment - A shunt has agreed to receive 2 units of PRBCs  Addendum * Noted that white blood cells are trending up so we will start ciprofloxacin for possible urinary tract infection and we will obtain chest x-ray to rule out the possibility of pneumonia    Allergies: No Known Allergies  Prior to Admission medications   Not on File    History reviewed. No pertinent past medical history.  History reviewed. No pertinent past  surgical history.  Social History:  does not have a smoking history on file. He does not have any smokeless tobacco history on file. His alcohol and drug histories not on file.  History reviewed. No pertinent family history.  Review of Systems:  Constitutional: Denies fever, chills, diaphoresis, appetite change and fatigue.  HEENT: Denies photophobia, eye pain, redness, hearing loss, ear pain, congestion, sore throat, rhinorrhea, sneezing, mouth sores,  trouble swallowing, neck pain, neck stiffness and tinnitus.   Respiratory: Denies SOB, DOE, cough, (+)chest tightness,  and no wheezing.   Cardiovascular: (+) chest pain, (-) palpitations and no leg swelling.  Gastrointestinal: Denies nausea, vomiting, abdominal pain, diarrhea, constipation, (+) blood in stool.  Genitourinary: Denies dysuria, urgency, frequency, hematuria, flank pain and difficulty urinating.  Musculoskeletal: Denies myalgias, back pain, joint swelling, arthralgias and gait problem.  Skin: Denies pallor, rash and wound.  Neurological: Denies dizziness, seizures, syncope, weakness, light-headedness, numbness and headaches.  Hematological: Denies adenopathy. Easy bruising, personal or family bleeding history  Psychiatric/Behavioral: Denies suicidal ideation, mood changes, confusion, nervousness, sleep disturbance and agitation   Physical Exam:  Filed Vitals:   05/03/11 1601 05/03/11 1800 05/03/11 1815 05/03/11 1915  BP: 137/57  161/79 140/77  Pulse:   93 105  Temp:    98 F (36.7 C)  TempSrc:    Oral  Resp:  16  18  SpO2:   100%     Constitutional: Vital signs reviewed.  Patient is a well-developed and well-nourished in no acute distress and cooperative with exam. Alert and oriented x3.  Head: Normocephalic and atraumatic Ear: TM normal bilaterally Mouth: no erythema or exudates, MMM Eyes: PERRL, EOMI, conjunctivae normal, No scleral icterus.  Neck: Supple, Trachea midline normal ROM, No JVD, mass, thyromegaly, or carotid bruit present.  Cardiovascular: RRR, S1 normal, S2 normal, no MRG, pulses symmetric and intact bilaterally Pulmonary/Chest: CTAB, no wheezes, rales, or rhonchi Abdominal: Soft. Non-tender, non-distended, bowel sounds are normal, no masses, organomegaly, or guarding present.  GU: no CVA tenderness Musculoskeletal: No joint deformities, erythema, or stiffness, ROM full and no nontender Ext: no edema and no cyanosis, pulses palpable bilaterally (DP  and PT) Hematology: no cervical, inginal, or axillary adenopathy.  Neurological: A&O x3, Strenght is normal and symmetric bilaterally, cranial nerve II-XII are grossly intact, no focal motor deficit, sensory intact to light touch bilaterally.  Skin: Warm, dry and intact. No rash, cyanosis, or clubbing.  Psychiatric: Normal mood and affect. speech and behavior is normal. Judgment and thought content normal. Cognition and memory are normal.   Labs on Admission:  Results for orders placed during the hospital encounter of 05/03/11 (from the past 48 hour(s))  PREPARE RBC (CROSSMATCH)     Status: Normal   Collection Time   05/03/11  4:00 PM      Component Value Range Comment   Order Confirmation ORDER PROCESSED BY BLOOD BANK     CBC     Status: Abnormal   Collection Time   05/03/11  4:23 PM      Component Value Range Comment   WBC 14.8 (*) 4.0 - 10.5 (K/uL)    RBC 1.80 (*) 4.22 - 5.81 (MIL/uL)    Hemoglobin 5.7 (*) 13.0 - 17.0 (g/dL)    HCT 16.1 (*) 09.6 - 52.0 (%)    MCV 90.6  78.0 - 100.0 (fL)    MCH 31.7  26.0 - 34.0 (pg)    MCHC 35.0  30.0 - 36.0 (g/dL)    RDW 04.5  40.9 -  15.5 (%)    Platelets 279  150 - 400 (K/uL)   DIFFERENTIAL     Status: Abnormal   Collection Time   05/03/11  4:23 PM      Component Value Range Comment   Neutrophils Relative 82 (*) 43 - 77 (%)    Neutro Abs 12.2 (*) 1.7 - 7.7 (K/uL)    Lymphocytes Relative 12  12 - 46 (%)    Lymphs Abs 1.8  0.7 - 4.0 (K/uL)    Monocytes Relative 5  3 - 12 (%)    Monocytes Absolute 0.8  0.1 - 1.0 (K/uL)    Eosinophils Relative 0  0 - 5 (%)    Eosinophils Absolute 0.0  0.0 - 0.7 (K/uL)    Basophils Relative 0  0 - 1 (%)    Basophils Absolute 0.0  0.0 - 0.1 (K/uL)   BASIC METABOLIC PANEL     Status: Abnormal   Collection Time   05/03/11  4:23 PM      Component Value Range Comment   Sodium 137  135 - 145 (mEq/L)    Potassium 3.5  3.5 - 5.1 (mEq/L)    Chloride 100  96 - 112 (mEq/L)    CO2 19  19 - 32 (mEq/L)    Glucose, Bld  146 (*) 70 - 99 (mg/dL)    BUN 15  6 - 23 (mg/dL)    Creatinine, Ser 8.29  0.50 - 1.35 (mg/dL)    Calcium 8.6  8.4 - 10.5 (mg/dL)    GFR calc non Af Amer 89 (*) >90 (mL/min)    GFR calc Af Amer >90  >90 (mL/min)   GLUCOSE, CAPILLARY     Status: Abnormal   Collection Time   05/03/11  4:29 PM      Component Value Range Comment   Glucose-Capillary 139 (*) 70 - 99 (mg/dL)   TYPE AND SCREEN     Status: Normal (Preliminary result)   Collection Time   05/03/11  5:16 PM      Component Value Range Comment   ABO/RH(D) A POS      Antibody Screen NEG      Sample Expiration 05/06/2011      Unit Number [id number]      Blood Component Type RED CELLS,LR      Unit division 00      Status of Unit ISSUED      Transfusion Status OK TO TRANSFUSE      Crossmatch Result Compatible       Radiological Exams on Admission: No results found.    Time Spent on Admission: Greater than 30 minutes  Ambriana Selway 05/03/2011, 7:24 PM

## 2011-05-04 ENCOUNTER — Encounter (HOSPITAL_COMMUNITY): Payer: Self-pay | Admitting: *Deleted

## 2011-05-04 DIAGNOSIS — D62 Acute posthemorrhagic anemia: Principal | ICD-10-CM

## 2011-05-04 DIAGNOSIS — K922 Gastrointestinal hemorrhage, unspecified: Secondary | ICD-10-CM

## 2011-05-04 LAB — CBC
HCT: 15.4 % — ABNORMAL LOW (ref 39.0–52.0)
HCT: 20.9 % — ABNORMAL LOW (ref 39.0–52.0)
MCH: 30.2 pg (ref 26.0–34.0)
MCHC: 34.9 g/dL (ref 30.0–36.0)
MCHC: 34.3 g/dL (ref 30.0–36.0)
Platelets: 288 10*3/uL (ref 150–400)
Platelets: 270 10*3/uL (ref 150–400)
Platelets: 292 10*3/uL (ref 150–400)
RBC: 2.45 MIL/uL — ABNORMAL LOW (ref 4.22–5.81)
RDW: 13.4 % (ref 11.5–15.5)
RDW: 14.3 % (ref 11.5–15.5)
WBC: 11.1 10*3/uL — ABNORMAL HIGH (ref 4.0–10.5)

## 2011-05-04 LAB — CARDIAC PANEL(CRET KIN+CKTOT+MB+TROPI)
CK, MB: 4 ng/mL (ref 0.3–4.0)
CK, MB: 3.7 ng/mL (ref 0.3–4.0)
Relative Index: 1 (ref 0.0–2.5)
Troponin I: <0.3 ng/mL (ref <0.30)
Troponin I: <0.3 ng/mL (ref <0.30)

## 2011-05-04 LAB — LIPID PANEL
Cholesterol: 150 mg/dL (ref 0–200)
LDL Cholesterol: 83 mg/dL (ref 0–99)
VLDL: 24 mg/dL (ref 0–40)

## 2011-05-04 LAB — URINALYSIS, ROUTINE W REFLEX MICROSCOPIC
Leukocytes, UA: NEGATIVE
Nitrite: NEGATIVE
Specific Gravity, Urine: 1.007 (ref 1.005–1.030)
Urobilinogen, UA: 0.2 mg/dL (ref 0.0–1.0)

## 2011-05-04 LAB — HEMOGLOBIN A1C: Mean Plasma Glucose: 134 mg/dL — ABNORMAL HIGH (ref <117)

## 2011-05-04 LAB — BASIC METABOLIC PANEL
CO2: 26 mEq/L (ref 19–32)
Calcium: 8.5 mg/dL (ref 8.4–10.5)
Creatinine, Ser: 0.83 mg/dL (ref 0.50–1.35)

## 2011-05-04 MED ORDER — SODIUM CHLORIDE 0.9 % IJ SOLN
INTRAMUSCULAR | Status: AC
  Administered 2011-05-04: 13:00:00
  Filled 2011-05-04: qty 10

## 2011-05-04 MED ORDER — LORAZEPAM 2 MG/ML IJ SOLN: 1.0000 mg | Freq: Four times a day (QID) | INTRAMUSCULAR | Status: DC | PRN

## 2011-05-04 MED ORDER — POTASSIUM CHLORIDE 10 MEQ/100ML IV SOLN
10.0000 meq | INTRAVENOUS | Status: AC
  Administered 2011-05-04 (×3): 10 meq via INTRAVENOUS
  Filled 2011-05-04: qty 100

## 2011-05-04 MED ORDER — SODIUM CHLORIDE 0.9 % IJ SOLN
INTRAMUSCULAR | Status: AC
  Administered 2011-05-04: 14:00:00
  Filled 2011-05-04: qty 20

## 2011-05-04 NOTE — Consult Note (Signed)
Referring Provider: [facility] Physician:  No primary provider on file. Primary Gastroenterologist: none  Reason for Consultation:   Gi bleed  HPI: Thomas Kline is a 61 y.o. male admitted yesterday, Unassigned with complaints of chest pain in the setting of recent GI bleeding. Patient had come to the emergency room on 214 complaining of dizziness and chest pain after passing bright red blood at home on 04/29/2011 into 13 2013. He is a poor historian but says that he passed a lot of bright red blood per rectum on the 11th did not bleed on the 12th and then had several episodes of bleeding again on the 13th. He did not apparently have any associated abdominal pain or cramping no nausea or vomiting his only complaint is gas. He denies any aspirin or NSAID use. He has history of EtOH use but says he has not been drinking at all since Christmas. He has never had any previous history of GI bleeding or any GI workup.  Patient is originally from [place] has been in the states for about 5 years. Apparently does have prior history of a seizure disorder but has not been on any regular medications.  CT scan of the abdomen and pelvis on 12 05/02/2011  was negative.  Hemoglobin on 2/14 was 5.8, MCV is normal at 89, BUN also normal. This morning after 2 units of blood his hemoglobin is still 5.4. He denies any further chest pain or shortness of breath and denies any abdominal pain today. He has not any evidence of bleeding overnight.   History reviewed. No pertinent past medical history.  History reviewed. No pertinent past surgical history.  Prior to Admission medications   Not on File    Current Facility-Administered Medications  Medication Dose Route Frequency Provider Last Rate Last Dose  . 0.9 %  sodium chloride infusion   Intravenous Continuous Manson Passey, MD 100 mL/hr at 05/03/11 2132    . acetaminophen (TYLENOL) tablet 650 mg  650 mg Oral Q6H PRN Mary A. Lynch, NP   650 mg at  05/03/11 2100  . ciprofloxacin (CIPRO) IVPB 400 mg  400 mg Intravenous Q12H Manson Passey, MD   400 mg at 05/03/11 2132  . LORazepam (ATIVAN) injection 1 mg  1 mg Intravenous Q6H PRN Manson Passey, MD      . pantoprazole (PROTONIX) injection 40 mg  40 mg Intravenous Q12H Manson Passey, MD   40 mg at 05/03/11 2133  . sodium chloride 0.9 % injection 3 mL  3 mL Intravenous Q12H Manson Passey, MD   3 mL at 05/03/11 2139    Allergies as of 05/03/2011  . (No Known Allergies)    History reviewed. No pertinent family history.  History   Social History  . Marital Status: Single    Spouse Name: N/A    Number of Children: N/A  . Years of Education: N/A   Occupational History  . Not on file.   Social History Main Topics  . Smoking status: Never Smoker   . Smokeless tobacco: Not on file  . Alcohol Use: Not on file  . Drug Use: Not on file  . Sexually Active: Not on file   Other Topics Concern  . Not on file   Social History Narrative  . No narrative on file    Review of Systems: Pertinent positive and negative review of systems were noted in the above HPI section.  All other review of systems was otherwise negative.Marland Kitchen  Physical Exam: Vital  signs in last 24 hours: Temp:  [97.3 F (36.3 C)-98.3 F (36.8 C)] 97.6 F (36.4 C) (02/16 0815) Pulse Rate:  [79-111] 87  (02/16 0715) Resp:  [12-28] 21  (02/16 0715) BP: (126-173)/(52-79) 131/63 mmHg (02/16 0815) SpO2:  [97 %-100 %] 100 % (02/16 0715) Weight:  [194 lb 3.6 oz (88.1 kg)-196 lb 6.9 oz (89.1 kg)] 194 lb 3.6 oz (88.1 kg) (02/16 0400) Last BM Date: 05/02/11 General:   Alert,  Well-developed, well-nourished, pleasant and cooperative in NAD Head:  Normocephalic and atraumatic. Eyes:  Sclera clear, no icterus.   Conjunctiva pale. Ears:  Normal auditory acuity. Nose:  No deformity, discharge,  or lesions. Mouth:  No deformity or lesions, dentition poor   Neck:  Supple; no masses or thyromegaly. Lungs:  Clear throughout to  auscultation.   No wheezes, crackles, or rhonchi. Heart:  Regular rate and rhythm; no murmurs, clicks, rubs,  or gallops. Abdomen:  Soft,nontender, BS active,nonpalp mass or hsm.   Rectal:  Deferred  Msk:  Symmetrical without gross deformities. . Pulses:  Normal pulses noted. Extremities:  Without clubbing or edema. Neurologic:  Alert and  oriented x4;  grossly normal neurologically. Skin:  Intact without significant lesions or rashes.. Psych:  Alert and cooperative. Normal mood and affect.  Intake/Output from previous day: 02/15 0701 - 02/16 0700 In: 253 [I.V.:253] Out: 1025 [Urine:1025] Intake/Output this shift:    Lab Results:  Basename 05/04/11 0300 05/03/11 1907 05/03/11 1623  WBC 11.1* 16.4* 14.8*  HGB 5.4* 6.0* 5.7*  HCT 15.4* 17.0* 16.3*  PLT 288 298 279   BMET  Basename 05/04/11 0239 05/03/11 1907 05/03/11 1623  NA 138 134* 137  K 3.1* 3.4* 3.5  CL 104 98 100  CO2 26 20 19   GLUCOSE 103* 137* 146*  BUN 9 14 15   CREATININE 0.83 0.91 0.90  CALCIUM 8.5 8.8 8.6   LFT  Basename 05/03/11 1907  PROT 6.2  ALBUMIN 3.4*  AST 13  ALT 10  ALKPHOS 58  BILITOT 0.1*  BILIDIR --  IBILI --   PT/INR  Basename 05/03/11 1907 05/02/11 1851  LABPROT 14.0 13.9  INR 1.06 1.05   Hepatitis Panel    Studies/Results: X-ray Chest Pa And Lateral   05/03/2011  *RADIOLOGY REPORT*  Clinical Data: Altered consciousness and chest pain.  CHEST - 2 VIEW  Comparison: 09/04/2010  Findings: Lateral view degraded by patient arm position.  Midline trachea.  Borderline cardiomegaly.     Mediastinal contours otherwise within normal limits.  No pleural effusion or pneumothorax.  Clear lungs.  IMPRESSION: Borderline cardiomegaly, without acute disease.  Original Report Authenticated By: Consuello Bossier, M.D.   Ct Abdomen Pelvis W Contrast  05/02/2011  *RADIOLOGY REPORT*  Clinical Data: Rectal bleeding.  Abdominal pain.  Anemia.  CT ABDOMEN AND PELVIS WITH CONTRAST  Technique:  Multidetector  CT imaging of the abdomen and pelvis was performed following the standard protocol during bolus administration of intravenous contrast.  Contrast: OMNIPAQUE IOHEXOL 300 MG/ML IV SOLN  Comparison: None.  Findings: The lung bases are clear without focal nodule, mass, or airspace disease.  Heart size is normal.  No significant pleural or pericardial effusion is present.  The liver and spleen are within normal limits.  The stomach, duodenum, and pancreas are normal.  The common bile duct and gallbladder are within normal limits. The adrenal glands are normal bilaterally.  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 Bilateral simple cysts are present in  the kidneys.  The ureters are within normal limits throughout their course to the urinary bladder.  The wall of the urinary bladder appears diffusely thickened.  No discrete mass lesion is evident.  The rectum is unremarkable.  No significant adenopathy is present. The sigmoid colon is within normal limits.  The remainder of the colon is within normal limits as well.  The appendix is visualized and normal.  The small bowel is unremarkable.  No significant adenopathy or free fluid is present.  Minimal atherosclerotic calcifications are noted.  The bone windows are within normal limits.  IMPRESSION:  1.  No significant rectal mass or adenopathy is present. 2.  Diffuse thickening of the bladder wall.  This could be seen with in the setting of cystitis or urinary tract infection. 3.  Bilateral renal cysts. 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  Original Report Authenticated By: Jamesetta Orleans. MATTERN, M.D.    IMPRESSION:  #32 61 year old male with acute major GI bleed presumed lower with initial bleed on 04/29/2011 and recurrent bleed on 05/01/2011 no active bleeding since. Will need to rule out diverticular bleeding versus other occult colonic or distal small bowel lesion. #2 severe normocytic anemia secondary to acute blood loss #3 history of  seizure disorder with seizure on 05/03/2011 #4 chest pain suspect demand ischemia. Currently pain-free  PLAN: #1 transfuse patient to keep hemoglobin at least in the 7 range #2 full liquid diet today #3 would anticipate bowel prep tomorrow and colonoscopy on Monday, 05/06/2011 per Dr. Audley Hose   Amy [facility]  05/04/2011, 9:02 AM

## 2011-05-04 NOTE — Progress Notes (Signed)
PT Cancellation Note  Treatment cancelled today due to medical issues with patient which prohibited therapy. Pt's Hgb is currently 6.3 and he is receiving blood. RN requested to wait to complete evaluation. Will attempt evaluation tomorrow pending medical stability.  Thanks, 05/04/2011 Milana Kidney DPT PAGER: [phone] OFFICE: [phone]    Milana Kidney 05/04/2011, 10:05 AM

## 2011-05-04 NOTE — Progress Notes (Signed)
Subjective: Abdominal cramping. Otherwise no complaints.  Objective: Vital signs in last 24 hours: Temp:  [97.3 F (36.3 C)-98.3 F (36.8 C)] 98 F (36.7 C) (02/16 1200) Pulse Rate:  [73-111] 81  (02/16 1200) Resp:  [12-28] 13  (02/16 1200) BP: (126-173)/(52-95) 134/61 mmHg (02/16 1200) SpO2:  [97 %-100 %] 99 % (02/16 1200) Weight:  [88.1 kg (194 lb 3.6 oz)-89.1 kg (196 lb 6.9 oz)] 88.1 kg (194 lb 3.6 oz) (02/16 0400) Weight change:  Last BM Date: 05/02/11  Intake/Output from previous day: 02/15 0701 - 02/16 0700 In: 253 [I.V.:253] Out: 1025 [Urine:1025] Total I/O In: 600 [I.V.:250; Blood:350] Out: 475 [Urine:475]   Physical Exam: General: Alert, awake, oriented x3, in no acute distress. HEENT: No bruits, no goiter. Heart: Regular rate and rhythm, without murmurs, rubs, gallops. Lungs: Clear to auscultation bilaterally. Abdomen: Soft, nontender, nondistended, positive bowel sounds. Extremities: No clubbing cyanosis or edema with positive pedal pulses. Neuro: Grossly intact, nonfocal.   Lab Results: Basic Metabolic Panel:  Basename 05/04/11 0239 05/03/11 1907  NA 138 134*  K 3.1* 3.4*  CL 104 98  CO2 26 20  GLUCOSE 103* 137*  BUN 9 14  CREATININE 0.83 0.91  CALCIUM 8.5 8.8  MG -- 2.4  PHOS -- 3.7   Liver Function Tests:  Basename 05/03/11 1907 05/02/11 1851  AST 13 11  ALT 10 8  ALKPHOS 58 56  BILITOT 0.1* 0.2*  PROT 6.2 5.8*  ALBUMIN 3.4* 3.2*   No results found for this basename: LIPASE:2,AMYLASE:2 in the last 72 hours No results found for this basename: AMMONIA:2 in the last 72 hours CBC:  Basename 05/04/11 0300 05/03/11 1907 05/03/11 1623  WBC 11.1* 16.4* --  NEUTROABS -- 14.2* 12.2*  HGB 5.4* 6.0* --  HCT 15.4* 17.0* --  MCV 90.1 89.5 --  PLT 288 298 --   Cardiac Enzymes:  Basename 05/04/11 0238 05/03/11 1907  CKTOTAL 391* 232  CKMB 4.0 3.6  CKMBINDEX -- --  TROPONINI <0.30 <0.30    CBG:  Basename 05/03/11 1629  GLUCAP 139*    Hemoglobin A1C:  Basename 05/03/11 1907  HGBA1C 6.3*   Fasting Lipid Panel:  Basename 05/03/11 1907  CHOL 150  HDL 43  LDLCALC 83  TRIG 118  CHOLHDL 3.5  LDLDIRECT --   Thyroid Function Tests:  Basename 05/03/11 1907  TSH 1.180  T4TOTAL --  FREET4 --  T3FREE --  THYROIDAB --   Coagulation:  Basename 05/03/11 1907 05/02/11 1851  LABPROT 14.0 13.9  INR 1.06 1.05   Urine Drug Screen: Drugs of Abuse     Component Value Date/Time   LABOPIA NONE DETECTED 08/07/2007 2145   COCAINSCRNUR NONE DETECTED 08/07/2007 2145   LABBENZ NONE DETECTED 08/07/2007 2145   AMPHETMU NONE DETECTED 08/07/2007 2145   THCU NONE DETECTED 08/07/2007 2145   LABBARB  Value: NONE DETECTED        DRUG SCREEN FOR MEDICAL PURPOSES ONLY.  IF CONFIRMATION IS NEEDED FOR ANY PURPOSE, NOTIFY LAB WITHIN 5 DAYS. 08/07/2007 2145     Recent Results (from the past 240 hour(s))  MRSA PCR SCREENING     Status: Normal   Collection Time   05/03/11  8:36 PM      Component Value Range Status Comment   MRSA by PCR NEGATIVE  NEGATIVE  Final     Studies/Results: X-ray Chest Pa And Lateral   05/03/2011  *RADIOLOGY REPORT*  Clinical Data: Altered consciousness and chest pain.  CHEST - 2 VIEW  Comparison: 09/04/2010  Findings: Lateral view degraded by patient arm position.  Midline trachea.  Borderline cardiomegaly.     Mediastinal contours otherwise within normal limits.  No pleural effusion or pneumothorax.  Clear lungs.  IMPRESSION: Borderline cardiomegaly, without acute disease.  Original Report Authenticated By: Consuello Bossier, M.D.   Ct Abdomen Pelvis W Contrast  05/02/2011  *RADIOLOGY REPORT*  Clinical Data: Rectal bleeding.  Abdominal pain.  Anemia.  CT ABDOMEN AND PELVIS WITH CONTRAST  Technique:  Multidetector CT imaging of the abdomen and pelvis was performed following the standard protocol during bolus administration of intravenous contrast.  Contrast: OMNIPAQUE IOHEXOL 300 MG/ML IV SOLN  Comparison:  None.  Findings: The lung bases are clear without focal nodule, mass, or airspace disease.  Heart size is normal.  No significant pleural or pericardial effusion is present.  The liver and spleen are within normal limits.  The stomach, duodenum, and pancreas are normal.  The common bile duct and gallbladder are within normal limits. The adrenal glands are normal bilaterally.  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 Bilateral simple cysts are present in the kidneys.  The ureters are within normal limits throughout their course to the urinary bladder.  The wall of the urinary bladder appears diffusely thickened.  No discrete mass lesion is evident.  The rectum is unremarkable.  No significant adenopathy is present. The sigmoid colon is within normal limits.  The remainder of the colon is within normal limits as well.  The appendix is visualized and normal.  The small bowel is unremarkable.  No significant adenopathy or free fluid is present.  Minimal atherosclerotic calcifications are noted.  The bone windows are within normal limits.  IMPRESSION:  1.  No significant rectal mass or adenopathy is present. 2.  Diffuse thickening of the bladder wall.  This could be seen with in the setting of cystitis or urinary tract infection. 3.  Bilateral renal cysts. 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  Original Report Authenticated By: Jamesetta Orleans. MATTERN, M.D.    Medications: Scheduled Meds:   . ciprofloxacin  400 mg Intravenous Q12H  . pantoprazole (PROTONIX) IV  40 mg Intravenous Q12H  . sodium chloride  3 mL Intravenous Q12H   Continuous Infusions:   . sodium chloride 100 mL/hr at 05/03/11 2132   PRN Meds:.acetaminophen, LORazepam  Assessment/Plan:  Principal Problem:  *Acute blood loss anemia Active Problems:  Tachycardia  Acute lower GI bleeding  Leukocytosis  Seizure  Chest pain  Acute lower GI bleeding  - Unclear etiology, GI plans to scope when stable. - Continue  Protonix 40 mg IV twice a day  - Provide IV fluids, normal saline at 100 cc an hour   Seizure  - As per patient this is the very first episode of seizure activity - Patient describes fall but no TC activity -at least that he can remember but ED notes say witnessed seizure-he needs MRI brain (prior to d/c) will get him through acute GIB for now-    for new onset seizure in adult, I suspect from his low Hb, demand ischemia in brain but would still be unusual. -Also will check HIV ab -Seizure precaution   Tachycardia  - Likely secondary to anemia  - Continue step-down status  Leukocytosis  - Patient is afebrile with white blood cells of 14.8  - At this time the source of leukocytosis is unknown  - UA, Bc neg - Hold off  on antibiotics as no obvious source of infection at present   Chest pain  -CE negative, probably epigastric pain/may be related to UGIB or PUD  Disposition  Maintain step-down status until Hb stable.    LOS: 1 day   Thomas Kline [facility] 05/04/2011, 12:19 PM

## 2011-05-04 NOTE — Consult Note (Signed)
I have taken a history, examined the patient and reviewed the chart. I agree with the extender's note, impression and recommendations. Covering for Dr. Hung.  Cerrone Debold T Melford Tullier MD FACG 

## 2011-05-05 LAB — HIV ANTIBODY (ROUTINE TESTING W REFLEX): HIV: NONREACTIVE

## 2011-05-05 LAB — CBC
HCT: 28.9 % — ABNORMAL LOW (ref 39.0–52.0)
Hemoglobin: 9.8 g/dL — ABNORMAL LOW (ref 13.0–17.0)
MCH: 30.4 pg (ref 26.0–34.0)
Platelets: 284 10*3/uL (ref 150–400)
Platelets: 299 10*3/uL (ref 150–400)
RBC: 2.39 MIL/uL — ABNORMAL LOW (ref 4.22–5.81)
RBC: 2.47 MIL/uL — ABNORMAL LOW (ref 4.22–5.81)
RDW: 14.5 % (ref 11.5–15.5)
RDW: 15.5 % (ref 11.5–15.5)
WBC: 6 10*3/uL (ref 4.0–10.5)
WBC: 7.1 10*3/uL (ref 4.0–10.5)
WBC: 8.4 10*3/uL (ref 4.0–10.5)

## 2011-05-05 LAB — BASIC METABOLIC PANEL
Calcium: 8.5 mg/dL (ref 8.4–10.5)
GFR calc Af Amer: >90 mL/min (ref >90)
GFR calc non Af Amer: >90 mL/min (ref >90)
Sodium: 142 mEq/L (ref 135–145)

## 2011-05-05 MED ORDER — PEG-KCL-NACL-NASULF-NA ASC-C 100 G PO SOLR
1.0000 | Freq: Once | ORAL | Status: AC
  Administered 2011-05-05: 100 g via ORAL
  Filled 2011-05-05: qty 1

## 2011-05-05 MED ORDER — HYDRALAZINE HCL 20 MG/ML IJ SOLN
10.0000 mg | INTRAMUSCULAR | Status: DC | PRN
  Filled 2011-05-05: qty 0.5

## 2011-05-05 MED ORDER — SODIUM CHLORIDE 0.9 % IJ SOLN
INTRAMUSCULAR | Status: AC
  Filled 2011-05-05: qty 10

## 2011-05-05 NOTE — Progress Notes (Signed)
Physical Therapy Evaluation Patient Details Name: Thomas Kline MRN: [id number] DOB: 07-30-1950 Today's Date: 05/05/2011  Problem List:  Patient Active Problem List  Diagnoses  . Tachycardia  . Acute blood loss anemia  . Acute lower GI bleeding  . Leukocytosis  . Seizure  . Chest pain    Past Medical History: History reviewed. No pertinent past medical history. Past Surgical History: History reviewed. No pertinent past surgical history.  PT Assessment/Plan/Recommendation PT Assessment Clinical Impression Statement: Pt presents with a medical diagnosis of acute blood loss anemia. Pt is at his baseline functional level and does not need any skilled PT. Please reorder if necessary. PT Recommendation/Assessment: Patent does not need any further PT services No Skilled PT: All education completed;Patient at baseline level of functioning PT Recommendation Follow Up Recommendations: No PT follow up Equipment Recommended: None recommended by PT PT Goals     PT Evaluation Precautions/Restrictions    Prior Functioning  Home Living Lives With: Alone Type of Home: Apartment Home Layout: Other (Comment) (2nd floor) Home Access: Elevator Bathroom Shower/Tub: Tub/shower unit;Curtain Bathroom Toilet: Standard Bathroom Accessibility: Yes How Accessible: Accessible via walker Home Adaptive Equipment: None Prior Function Level of Independence: Independent with basic ADLs;Independent with homemaking with ambulation;Independent with gait;Independent with transfers Able to Take Stairs?: Yes Driving: No Vocation: Part time employment Vocation Requirements: custodian work, 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 Arousal/Alertness: Awake/alert Overall Cognitive Status: Appears within functional limits for tasks assessed Orientation Level: Oriented X4 Sensation/Coordination Sensation Light Touch: Appears Intact Extremity Assessment RLE Assessment RLE Assessment: Within Functional  Limits LLE Assessment LLE Assessment: Within Functional Limits Mobility (including Balance) Bed Mobility Bed Mobility: Yes Supine to Sit: 6: Modified independent (Device/Increase time) Sit to Supine: 6: Modified independent (Device/Increase time) Scooting to [facility]: 6: Modified independent (Device/Increase time) Transfers Transfers: Yes Sit to Stand: 6: Modified independent (Device/Increase time) Stand to Sit: 6: Modified independent (Device/Increase time) Ambulation/Gait Ambulation/Gait: Yes Ambulation/Gait Assistance: 5: Supervision Ambulation/Gait Assistance Details (indicate cue type and reason): Supervision for safety secondary to impulsivity Ambulation Distance (Feet): 150 Feet Assistive device: None Gait Pattern: Within Functional Limits Gait velocity: Normal gait speed  Balance Balance Assessed: Yes Static Standing Balance Static Standing - Balance Support: No upper extremity supported Static Standing - Level of Assistance: 6: Modified independent (Device/Increase time) Static Standing - Comment/# of Minutes: 3 during functional activity at toilet Exercise    End of Session PT - End of Session Equipment Utilized During Treatment: Gait belt Activity Tolerance: Patient tolerated treatment well Patient left: in bed;with call bell in reach Nurse Communication: Mobility status for transfers;Mobility status for ambulation General Behavior During Session: [facility] for tasks performed Cognition: [facility] for tasks performed  Milana Kidney 05/05/2011, 2:08 PM  05/05/2011 Milana Kidney DPT PAGER: [phone] OFFICE: [phone]

## 2011-05-05 NOTE — Progress Notes (Signed)
Patient ID: Thomas Kline, male   DOB: 01-01-1951, 61 y.o.   MRN: [id number]  [facility] Progress Note  Subjective: No bowel movement, chest pain or seizures.   Objective:  Vital signs in last 24 hours: Temp:  [97.4 F (36.3 C)-98.2 F (36.8 C)] 97.4 F (36.3 C) (02/17 0748) Pulse Rate:  [70-103] 70  (02/17 0748) Resp:  [13-18] 18  (02/17 0400) BP: (131-163)/(60-95) 146/69 mmHg (02/17 0748) SpO2:  [98 %-100 %] 100 % (02/17 0748) Weight:  [192 lb 3.9 oz (87.2 kg)] 192 lb 3.9 oz (87.2 kg) (02/17 0500) Last BM Date: 05/02/11 General:   Alert,  Well-developed,  white male in NAD Heart:  Regular rate and rhythm; no murmurs Abdomen:  Soft, nontender and nondistended. Normal bowel sounds, without guarding, and without rebound.   Extremities:  Without edema. Neurologic:  Alert and  oriented x4;  grossly normal neurologically. Psych:  Alert and cooperative. Normal mood and affect.  Intake/Output from previous day: 02/16 0701 - 02/17 0700 In: 2300 [I.V.:1750; Blood:350; IV Piggyback:200] Out: 2850 [Urine:2850] Intake/Output this shift:   Lab Results:  Basename 05/05/11 0622 05/04/11 2350 05/04/11 1915  WBC 6.0 7.1 7.4  HGB 7.5* 7.3* 7.4*  HCT 21.9* 21.0* 21.6*  PLT 299 284 292   BMET  Basename 05/05/11 0622 05/04/11 0239 05/03/11 1907  NA 142 138 134*  K 3.4* 3.1* 3.4*  CL 107 104 98  CO2 27 26 20   GLUCOSE 105* 103* 137*  BUN 5* 9 14  CREATININE 0.82 0.83 0.91  CALCIUM 8.5 8.5 8.8   LFT  Basename 05/03/11 1907  PROT 6.2  ALBUMIN 3.4*  AST 13  ALT 10  ALKPHOS 58  BILITOT 0.1*  BILIDIR --  IBILI --   PT/INR  Basename 05/03/11 1907 05/02/11 1851  LABPROT 14.0 13.9  INR 1.06 1.05   Studies/Results: X-ray Chest Pa And Lateral   05/03/2011  *RADIOLOGY REPORT*  Clinical Data: Altered consciousness and chest pain.  CHEST - 2 VIEW  Comparison: 09/04/2010  Findings: Lateral view degraded by patient arm position.  Midline trachea.  Borderline  cardiomegaly.     Mediastinal contours otherwise within normal limits.  No pleural effusion or pneumothorax.  Clear lungs.  IMPRESSION: Borderline cardiomegaly, without acute disease.  Original Report Authenticated By: Consuello Bossier, M.D.   Assessment / Plan:  1. GI Bleeding. Presumed lower GI source. Proceed with colonoscopy tomorrow with Dr. Elnoria Howard. 2. Anemia-post hemorrhagic. Hb stable at 7.5 3. Seizure disorder 4. Chest pain  Dr. Elnoria Howard to assume care tomorrow.  Principal Problem:  *Acute blood loss anemia Active Problems:  Tachycardia  Acute lower GI bleeding  Leukocytosis  Seizure  Chest pain    LOS: 2 days   Judie Petit T. Russella Dar MD [facility] 05/05/2011, 9:33 AM

## 2011-05-05 NOTE — Progress Notes (Signed)
Subjective: 60 y/o male who presented with maroon/red blood per rectum on 2/14 but decided to leave AMA. He returned on the folllowing day for left sided chest pain and was noted to have a tonic clonic seizure in the ER. He has no complaints today. He states he hasn't had a BM for the past 48 hrs. When discussing his seizure, he tells me he had one this past summer and was brought to the ER and discharged from the ER in about 4-5 hrs. He tells me it occurred due to working outdoors in the heat.   Objective: Vital signs in last 24 hours: Temp:  [97.4 F (36.3 C)-98.1 F (36.7 C)] 97.8 F (36.6 C) (02/17 1555) Pulse Rate:  [70-97] 87  (02/17 1455) Resp:  [13-20] 17  (02/17 1455) BP: (139-172)/(69-93) 157/78 mmHg (02/17 1555) SpO2:  [98 %-100 %] 99 % (02/17 1200) Weight:  [87.2 kg (192 lb 3.9 oz)] 87.2 kg (192 lb 3.9 oz) (02/17 0500) Weight change: -1.9 kg (-4 lb 3 oz) Last BM Date: 05/02/11  Intake/Output from previous day: 02/16 0701 - 02/17 0700 In: 2300 [I.V.:1750; Blood:350; IV Piggyback:200] Out: 2850 [Urine:2850] Total I/O In: 400 [Blood:400] Out: 650 [Urine:650]   Physical Exam: General: Alert, awake, oriented x3, in no acute distress. HEENT: No bruits, no goiter. Heart: Regular rate and rhythm, without murmurs, rubs, gallops. Lungs: Clear to auscultation bilaterally. Abdomen: Soft, nontender, nondistended, positive bowel sounds. Extremities: No clubbing cyanosis or edema with positive pedal pulses. Neuro: Grossly intact, nonfocal.   Lab Results: Basic Metabolic Panel:  Basename 05/05/11 0622 05/04/11 0239 05/03/11 1907  NA 142 138 --  K 3.4* 3.1* --  CL 107 104 --  CO2 27 26 --  GLUCOSE 105* 103* --  BUN 5* 9 --  CREATININE 0.82 0.83 --  CALCIUM 8.5 8.5 --  MG -- -- 2.4  PHOS -- -- 3.7   Liver Function Tests:  Basename 05/03/11 1907 05/02/11 1851  AST 13 11  ALT 10 8  ALKPHOS 58 56  BILITOT 0.1* 0.2*  PROT 6.2 5.8*  ALBUMIN 3.4* 3.2*   No results  found for this basename: LIPASE:2,AMYLASE:2 in the last 72 hours No results found for this basename: AMMONIA:2 in the last 72 hours CBC:  Basename 05/05/11 0622 05/04/11 2350 05/03/11 1907 05/03/11 1623  WBC 6.0 7.1 -- --  NEUTROABS -- -- 14.2* 12.2*  HGB 7.5* 7.3* -- --  HCT 21.9* 21.0* -- --  MCV 88.7 87.9 -- --  PLT 299 284 -- --   Cardiac Enzymes:  Basename 05/04/11 1530 05/04/11 0238 05/03/11 1907  CKTOTAL 514* 391* 232  CKMB 3.7 4.0 3.6  CKMBINDEX -- -- --  TROPONINI <0.30 <0.30 <0.30    CBG:  Basename 05/03/11 1629  GLUCAP 139*   Hemoglobin A1C:  Basename 05/03/11 1907  HGBA1C 6.3*   Fasting Lipid Panel:  Basename 05/03/11 1907  CHOL 150  HDL 43  LDLCALC 83  TRIG 118  CHOLHDL 3.5  LDLDIRECT --   Thyroid Function Tests:  Basename 05/03/11 1907  TSH 1.180  T4TOTAL --  FREET4 --  T3FREE --  THYROIDAB --   Coagulation:  Basename 05/03/11 1907 05/02/11 1851  LABPROT 14.0 13.9  INR 1.06 1.05   Urine Drug Screen: Drugs of Abuse     Component Value Date/Time   LABOPIA NONE DETECTED 08/07/2007 2145   COCAINSCRNUR NONE DETECTED 08/07/2007 2145   LABBENZ NONE DETECTED 08/07/2007 2145   AMPHETMU NONE DETECTED 08/07/2007 2145     THCU NONE DETECTED 08/07/2007 2145   LABBARB  Value: NONE DETECTED        DRUG SCREEN FOR MEDICAL PURPOSES ONLY.  IF CONFIRMATION IS NEEDED FOR ANY PURPOSE, NOTIFY LAB WITHIN 5 DAYS. 08/07/2007 2145     Recent Results (from the past 240 hour(s))  MRSA PCR SCREENING     Status: Normal   Collection Time   05/03/11  8:36 PM      Component Value Range Status Comment   MRSA by PCR NEGATIVE  NEGATIVE  Final     Studies/Results: X-ray Chest Pa And Lateral   05/03/2011  *RADIOLOGY REPORT*  Clinical Data: Altered consciousness and chest pain.  CHEST - 2 VIEW  Comparison: 09/04/2010  Findings: Lateral view degraded by patient arm position.  Midline trachea.  Borderline cardiomegaly.     Mediastinal contours otherwise within normal limits.   No pleural effusion or pneumothorax.  Clear lungs.  IMPRESSION: Borderline cardiomegaly, without acute disease.  Original Report Authenticated By: KYLE D. TALBOT, M.D.    Medications: Scheduled Meds:    . ciprofloxacin  400 mg Intravenous Q12H  . pantoprazole (PROTONIX) IV  40 mg Intravenous Q12H  . peg 3350 powder  1 kit Oral Once  . potassium chloride  10 mEq Intravenous Q1 Hr x 3  . sodium chloride       Continuous Infusions:    . sodium chloride 100 mL/hr at 05/03/11 2132   PRN Meds:.acetaminophen, LORazepam  Assessment/Plan: Acute lower GI bleeding  - Unclear etiology, GI plans to scope tomorrow - Continue Protonix 40 mg IV twice a day  - Provide IV fluids, normal saline at 100 cc an hour   Anemia due to acute blood loss s/p transfusion of 2 units of blood on 2/15 - he will receive 2 units of blood today and we will cont to follow Hgb.   Seizure  -he needs MRI brain (when stable from GI point) and an EEG prior to d/c -it may have resulted from his low Hb and demand ischemia in brain but would still be unusual. -Also will check HIV ab -Seizure precaution   Tachycardia  - Likely secondary to anemia  - Continue step-down status  Leukocytosis  - Patient is afebrile with white blood cells of 14.8  - At this time the source of leukocytosis is unknown  - UA, Bc neg - Hold off on antibiotics as no obvious source of infection at present   Chest pain  -CE negative, cardiac vs GI related- epigastric pain/may be related to UGIB or PUD  HTN - may be related to aggressive fluids and blood transfusions. Will cont to follow. PRN Hydralazine for now.   Disposition  Maintain step-down status until Hb stable.    LOS: 2 days   Helvi Royals [facility] 05/05/2011, 4:31 PM    

## 2011-05-06 ENCOUNTER — Encounter (HOSPITAL_COMMUNITY)
Admission: EM | Disposition: A | Discharge status: Home / Self Care | Payer: Self-pay | Source: Home / Self Care | Attending: Internal Medicine

## 2011-05-06 ENCOUNTER — Other Ambulatory Visit: Payer: Self-pay | Admitting: Gastroenterology

## 2011-05-06 ENCOUNTER — Encounter (HOSPITAL_COMMUNITY): Payer: Self-pay

## 2011-05-06 ENCOUNTER — Inpatient Hospital Stay (HOSPITAL_COMMUNITY): Payer: Self-pay

## 2011-05-06 DIAGNOSIS — K5730 Diverticulosis of large intestine without perforation or abscess without bleeding: Secondary | ICD-10-CM

## 2011-05-06 HISTORY — DX: Diverticulosis of large intestine without perforation or abscess without bleeding: K57.30

## 2011-05-06 HISTORY — PX: COLONOSCOPY: SHX5424

## 2011-05-06 LAB — CBC
HCT: 30.4 % — ABNORMAL LOW (ref 39.0–52.0)
HCT: 30.9 % — ABNORMAL LOW (ref 39.0–52.0)
Hemoglobin: 10.1 g/dL — ABNORMAL LOW (ref 13.0–17.0)
Hemoglobin: 10.4 g/dL — ABNORMAL LOW (ref 13.0–17.0)
MCHC: 33.2 g/dL (ref 30.0–36.0)
MCV: 88.3 fL (ref 78.0–100.0)
MCV: 88.4 fL (ref 78.0–100.0)
Platelets: 318 10*3/uL (ref 150–400)
RBC: 3.35 MIL/uL — ABNORMAL LOW (ref 4.22–5.81)
RDW: 15.7 % — ABNORMAL HIGH (ref 11.5–15.5)
WBC: 7.8 10*3/uL (ref 4.0–10.5)
WBC: 7.9 10*3/uL (ref 4.0–10.5)

## 2011-05-06 LAB — TYPE AND SCREEN
ABO/RH(D): A POS
Unit division: 0
Unit division: 0

## 2011-05-06 SURGERY — COLONOSCOPY
Anesthesia: Moderate Sedation

## 2011-05-06 MED ORDER — POTASSIUM CHLORIDE CRYS ER 20 MEQ PO TBCR
40.0000 meq | EXTENDED_RELEASE_TABLET | Freq: Two times a day (BID) | ORAL | Status: DC
  Administered 2011-05-06 – 2011-05-07 (×2): 40 meq via ORAL
  Filled 2011-05-06 (×3): qty 2

## 2011-05-06 MED ORDER — MIDAZOLAM HCL 10 MG/2ML IJ SOLN
INTRAMUSCULAR | Status: AC
  Filled 2011-05-06: qty 2

## 2011-05-06 MED ORDER — FENTANYL NICU IV SYRINGE 50 MCG/ML
INJECTION | INTRAMUSCULAR | Status: DC | PRN
  Administered 2011-05-06 (×4): 25 ug via INTRAVENOUS

## 2011-05-06 MED ORDER — SODIUM CHLORIDE 0.9 % IJ SOLN
INTRAMUSCULAR | Status: AC
  Administered 2011-05-06: 01:00:00
  Filled 2011-05-06: qty 10

## 2011-05-06 MED ORDER — MORPHINE SULFATE 2 MG/ML IJ SOLN: 1.0000 mg | INTRAMUSCULAR | Status: DC | PRN

## 2011-05-06 MED ORDER — SODIUM CHLORIDE 0.9 % IJ SOLN
INTRAMUSCULAR | Status: AC
  Administered 2011-05-06: 12:00:00
  Filled 2011-05-06: qty 20

## 2011-05-06 MED ORDER — METOPROLOL TARTRATE 1 MG/ML IV SOLN
10.0000 mg | Freq: Once | INTRAVENOUS | Status: AC
  Administered 2011-05-06: 10 mg via INTRAVENOUS

## 2011-05-06 MED ORDER — FENTANYL CITRATE 0.05 MG/ML IJ SOLN
INTRAMUSCULAR | Status: AC
  Filled 2011-05-06: qty 2

## 2011-05-06 MED ORDER — OXYCODONE HCL 5 MG PO TABS: 5.0000 mg | ORAL_TABLET | ORAL | Status: DC | PRN

## 2011-05-06 MED ORDER — METOPROLOL TARTRATE 50 MG PO TABS
50.0000 mg | ORAL_TABLET | Freq: Two times a day (BID) | ORAL | Status: DC
  Administered 2011-05-07: 50 mg via ORAL
  Filled 2011-05-06 (×3): qty 1

## 2011-05-06 MED ORDER — MIDAZOLAM HCL 10 MG/2ML IJ SOLN
INTRAMUSCULAR | Status: DC | PRN
  Administered 2011-05-06: 2.5 mg via INTRAVENOUS
  Administered 2011-05-06: 1 mg via INTRAVENOUS
  Administered 2011-05-06: 2 mg via INTRAVENOUS
  Administered 2011-05-06: 2.5 mg via INTRAVENOUS

## 2011-05-06 NOTE — Progress Notes (Signed)
Pt's HR up to 140s sustained.  Dr. Sharon Seller notified, new orders received,  Roselie Awkward, RN

## 2011-05-06 NOTE — Progress Notes (Signed)
Order received, chart reviewed, noted with PT pt was a one time eval functioning at a Mod I level. Spoke with pt about role of OT and he feels that he will not have any issues with BADLs. Per RN Carollee Herter) pt has been getting up to [facility] only because of bowel prep; otherwise he could easily get up to bathroom. If at anytime pt is no longer Mod I or I, please re-consult OT and PT. Made pt and RN aware of this. Acute OT signing off. Ignacia Palma, [place] [phone] 05/06/2011

## 2011-05-06 NOTE — Interval H&P Note (Signed)
History and Physical Interval Note:  05/06/2011 4:51 PM  Thomas Kline  has presented today for surgery, with the diagnosis of hematochezia  The various methods of treatment have been discussed with the patient and family. After consideration of risks, benefits and other options for treatment, the patient has consented to  Procedure(s) (LRB): COLONOSCOPY (N/A) as a surgical intervention .  The patients' history has been reviewed, patient examined, no change in status, stable for surgery.  I have reviewed the patients' chart and labs.  Questions were answered to the patient's satisfaction.     Aviah Sorci D

## 2011-05-06 NOTE — ED Provider Notes (Signed)
I anemia. Seen yesterday for same and left AMA and taken transfusion. Patient had a seizure upon arrival to ED today. He has a history of seizures. Head CT was done at that time. Patient will be admitted saw and evaluated the patient, reviewed the resident's note and I agree with the findings and plan.  Juliet Rude. Rubin Payor, MD 05/06/11 [phone]

## 2011-05-06 NOTE — H&P (View-Only) (Signed)
Subjective: 61 y/o male who presented with maroon/red blood per rectum on 2/14 but decided to leave AMA. He returned on the folllowing day for left sided chest pain and was noted to have a tonic clonic seizure in the ER. He has no complaints today. He states he hasn't had a BM for the past 48 hrs. When discussing his seizure, he tells me he had one this past summer and was brought to the ER and discharged from the ER in about 4-5 hrs. He tells me it occurred due to working outdoors in the heat.   Objective: Vital signs in last 24 hours: Temp:  [97.4 F (36.3 C)-98.1 F (36.7 C)] 97.8 F (36.6 C) (02/17 1555) Pulse Rate:  [70-97] 87  (02/17 1455) Resp:  [13-20] 17  (02/17 1455) BP: (139-172)/(69-93) 157/78 mmHg (02/17 1555) SpO2:  [98 %-100 %] 99 % (02/17 1200) Weight:  [87.2 kg (192 lb 3.9 oz)] 87.2 kg (192 lb 3.9 oz) (02/17 0500) Weight change: -1.9 kg (-4 lb 3 oz) Last BM Date: 05/02/11  Intake/Output from previous day: 02/16 0701 - 02/17 0700 In: 2300 [I.V.:1750; Blood:350; IV Piggyback:200] Out: 2850 [Urine:2850] Total I/O In: 400 [Blood:400] Out: 650 [Urine:650]   Physical Exam: General: Alert, awake, oriented x3, in no acute distress. HEENT: No bruits, no goiter. Heart: Regular rate and rhythm, without murmurs, rubs, gallops. Lungs: Clear to auscultation bilaterally. Abdomen: Soft, nontender, nondistended, positive bowel sounds. Extremities: No clubbing cyanosis or edema with positive pedal pulses. Neuro: Grossly intact, nonfocal.   Lab Results: Basic Metabolic Panel:  Basename 05/05/11 0622 05/04/11 0239 05/03/11 1907  NA 142 138 --  K 3.4* 3.1* --  CL 107 104 --  CO2 27 26 --  GLUCOSE 105* 103* --  BUN 5* 9 --  CREATININE 0.82 0.83 --  CALCIUM 8.5 8.5 --  MG -- -- 2.4  PHOS -- -- 3.7   Liver Function Tests:  Basename 05/03/11 1907 05/02/11 1851  AST 13 11  ALT 10 8  ALKPHOS 58 56  BILITOT 0.1* 0.2*  PROT 6.2 5.8*  ALBUMIN 3.4* 3.2*   No results  found for this basename: LIPASE:2,AMYLASE:2 in the last 72 hours No results found for this basename: AMMONIA:2 in the last 72 hours CBC:  Basename 05/05/11 0622 05/04/11 2350 05/03/11 1907 05/03/11 1623  WBC 6.0 7.1 -- --  NEUTROABS -- -- 14.2* 12.2*  HGB 7.5* 7.3* -- --  HCT 21.9* 21.0* -- --  MCV 88.7 87.9 -- --  PLT 299 284 -- --   Cardiac Enzymes:  Basename 05/04/11 1530 05/04/11 0238 05/03/11 1907  CKTOTAL 514* 391* 232  CKMB 3.7 4.0 3.6  CKMBINDEX -- -- --  TROPONINI <0.30 <0.30 <0.30    CBG:  Basename 05/03/11 1629  GLUCAP 139*   Hemoglobin A1C:  Basename 05/03/11 1907  HGBA1C 6.3*   Fasting Lipid Panel:  Basename 05/03/11 1907  CHOL 150  HDL 43  LDLCALC 83  TRIG 118  CHOLHDL 3.5  LDLDIRECT --   Thyroid Function Tests:  Basename 05/03/11 1907  TSH 1.180  T4TOTAL --  FREET4 --  T3FREE --  THYROIDAB --   Coagulation:  Basename 05/03/11 1907 05/02/11 1851  LABPROT 14.0 13.9  INR 1.06 1.05   Urine Drug Screen: Drugs of Abuse     Component Value Date/Time   LABOPIA NONE DETECTED 08/07/2007 2145   COCAINSCRNUR NONE DETECTED 08/07/2007 2145   LABBENZ NONE DETECTED 08/07/2007 2145   AMPHETMU NONE DETECTED 08/07/2007 2145  THCU NONE DETECTED 08/07/2007 2145   LABBARB  Value: NONE DETECTED        DRUG SCREEN FOR MEDICAL PURPOSES ONLY.  IF CONFIRMATION IS NEEDED FOR ANY PURPOSE, NOTIFY LAB WITHIN 5 DAYS. 08/07/2007 2145     Recent Results (from the past 240 hour(s))  MRSA PCR SCREENING     Status: Normal   Collection Time   05/03/11  8:36 PM      Component Value Range Status Comment   MRSA by PCR NEGATIVE  NEGATIVE  Final     Studies/Results: X-ray Chest Pa And Lateral   05/03/2011  *RADIOLOGY REPORT*  Clinical Data: Altered consciousness and chest pain.  CHEST - 2 VIEW  Comparison: 09/04/2010  Findings: Lateral view degraded by patient arm position.  Midline trachea.  Borderline cardiomegaly.     Mediastinal contours otherwise within normal limits.   No pleural effusion or pneumothorax.  Clear lungs.  IMPRESSION: Borderline cardiomegaly, without acute disease.  Original Report Authenticated By: Consuello Bossier, M.D.    Medications: Scheduled Meds:    . ciprofloxacin  400 mg Intravenous Q12H  . pantoprazole (PROTONIX) IV  40 mg Intravenous Q12H  . peg 3350 powder  1 kit Oral Once  . potassium chloride  10 mEq Intravenous Q1 Hr x 3  . sodium chloride       Continuous Infusions:    . sodium chloride 100 mL/hr at 05/03/11 2132   PRN Meds:.acetaminophen, LORazepam  Assessment/Plan: Acute lower GI bleeding  - Unclear etiology, GI plans to scope tomorrow - Continue Protonix 40 mg IV twice a day  - Provide IV fluids, normal saline at 100 cc an hour   Anemia due to acute blood loss s/p transfusion of 2 units of blood on 2/15 - he will receive 2 units of blood today and we will cont to follow Hgb.   Seizure  -he needs MRI brain (when stable from GI point) and an EEG prior to d/c -it may have resulted from his low Hb and demand ischemia in brain but would still be unusual. -Also will check HIV ab -Seizure precaution   Tachycardia  - Likely secondary to anemia  - Continue step-down status  Leukocytosis  - Patient is afebrile with white blood cells of 14.8  - At this time the source of leukocytosis is unknown  - UA, Bc neg - Hold off on antibiotics as no obvious source of infection at present   Chest pain  -CE negative, cardiac vs GI related- epigastric pain/may be related to UGIB or PUD  HTN - may be related to aggressive fluids and blood transfusions. Will cont to follow. PRN Hydralazine for now.   Disposition  Maintain step-down status until Hb stable.    LOS: 2 days   [facility] 05/05/2011, 4:31 PM

## 2011-05-06 NOTE — Op Note (Signed)
[name] [address] [place], [place]  [address]  OPERATIVE PROCEDURE REPORT  PATIENT:  Thomas Kline, Thomas Kline  MR#:  [id number] BIRTHDATE:  Jan 09, 1951  GENDER:  male ENDOSCOPIST:  Jeani Hawking, MD ASSISTANT:  Beryle Beams, Technician, Kizzie Bane and Dwain Sarna, RN CGRN PROCEDURE DATE:  05/06/2011 PROCEDURE:  Colonoscopy with snare polypectomy ASA CLASS:  Class III INDICATIONS:  Hematochezia MEDICATIONS:  Fentanyl 100 mcg IV, Versed 8 mg IV  DESCRIPTION OF PROCEDURE:   After the risks benefits and alternatives of the procedure were thoroughly explained, informed consent was obtained.  Digital rectal exam was performed and revealed no abnormalities.   The Pentax Colonoscope [id number] endoscope was introduced through the anus and advanced to the cecum, which was identified by both the appendix and ileocecal valve, without limitations.  The quality of the prep was good.. The instrument was then slowly withdrawn as the colon was fully examined.<<PROCEDUREIMAGES>>  FINDINGS:  Three 3 mm sessile descending colon polyps were removed with a cold snare. A 3 mm pedunculated polyp was removed with a hot snare. Scattered pan-divertiuclosis was identified. The majority of the diverticula were in the ascending colon. Retroflexed views in the rectum revealed internal and external hemorrhoids.    The scope was then withdrawn from the patient and the procedure terminated.  COMPLICATIONS:  None  IMPRESSION:  1) Polyp, multiple. 2) Internal and external hemorrhoids. 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 RECOMMENDATIONS:  1) Await biopsy results. 2) Repeat colonocopy in 3-5 years. 3) Follow HGB and transfuse if needed.  ______________________________ Jeani Hawking, MD  n. Rosalie DoctorJeani Hawking at 05/06/2011 05:42 PM  Marco Collie, [phone]

## 2011-05-06 NOTE — Progress Notes (Signed)
  Echocardiogram 2D Echocardiogram has been performed.  Cathie Beams Deneen 05/06/2011, 10:52 AM

## 2011-05-06 NOTE — Progress Notes (Addendum)
[facility] [place] TEAM 8  Subjective: 61 year old male with no significant past medical history, was just recently in emergency room (05/02/2011) and was found with a hemoglobin of 5.8 however before transfusion was started patient's left hospital AGAINST MEDICAL ADVICE.  As per patient he had bright red blood per rectum on Monday (04/29/2011) and Wednesday (05/01/11) but hasn't had any bleeding episode since then.  While patient was in triage he has had one episode of tonic-clonic seizure/convulsions.    Resting comfortably post-endo.  No complaints.  Reports no further bleeding.  Is asking to go home asap.  Denies f/c, sob, n/v, bleeding, or chest pain.    Objective: Weight change: 0 kg (0 lb)  Intake/Output Summary (Last 24 hours) at 05/06/11 1842 Last data filed at 05/06/11 1600  Gross per 24 hour  Intake   2100 ml  Output   2625 ml  Net   -525 ml   Blood pressure 142/93, pulse 72, temperature 97.2 F (36.2 C), temperature source Oral, resp. rate 16, height 6\' 1"  (1.854 m), weight 87.2 kg (192 lb 3.9 oz), SpO2 100.00%.  Physical Exam: General: No acute respiratory distress Lungs: Clear to auscultation bilaterally without wheezes or crackles Cardiovascular: Regular rate and rhythm without murmur gallop or rub  Abdomen: Nontender, nondistended, soft, bowel sounds positive, no rebound, no ascites, no appreciable mass Extremities: No significant cyanosis, clubbing, or edema bilateral lower extremities  Lab Results:  [facility] 05/05/11 0622 05/04/11 0239 05/03/11 1907  NA 142 138 134*  K 3.4* 3.1* 3.4*  CL 107 104 98  CO2 27 26 20   GLUCOSE 105* 103* 137*  BUN 5* 9 14  CREATININE 0.82 0.83 0.91  CALCIUM 8.5 8.5 8.8  MG -- -- 2.4  PHOS -- -- 3.7    Basename 05/03/11 1907  AST 13  ALT 10  ALKPHOS 58  BILITOT 0.1*  PROT 6.2  ALBUMIN 3.4*    Basename 05/06/11 1155 05/06/11 0605 05/06/11 0015 05/03/11 1907  WBC 7.8 7.7 7.9 --  NEUTROABS -- -- -- 14.2*  HGB  10.4* 10.1* 10.0* --  HCT 30.9* 30.4* 29.6* --  MCV 88.3 87.6 88.4 --  PLT 343 343 318 --    Basename 05/04/11 1530 05/04/11 0238 05/03/11 1907  CKTOTAL 514* 391* 232  CKMB 3.7 4.0 3.6  CKMBINDEX -- -- --  TROPONINI <0.30 <0.30 <0.30    Basename 05/03/11 1907  HGBA1C 6.3*    Micro Results: Recent Results (from the past 240 hour(s))  MRSA PCR SCREENING     Status: Normal   Collection Time   05/03/11  8:36 PM      Component Value Range Status Comment   MRSA by PCR NEGATIVE  NEGATIVE  Final     Studies/Results: All recent x-ray/radiology reports have been reviewed in detail.   Medications: I have reviewed the patient's complete medication list.  Assessment/Plan:  Acute lower GI bleed Colo today reveals multiple polyps, internal and external hemorrhoids, and pan-diverticulosis - will watch for recurrent bleeding   Multiple colon polyps  Sent for path - f/u as per GI reccs  Acute blood loss anemia  S/p transfusion of 2u PRBC - Hgb holding steady for now - recheck in AM  Seizure/convulsion EEG ordered - will check MRI in am - ? Simply convulsions due to sycnope in setting of hypovolemia due to blood loss  Tachycardia Episodic - ? Related to volume - cont to follow on tele - does appear to be sinus  Leukocytosis Resolved  Chest pain Resolved  Elevated HgbA1c Will check fasting CBG in AM - appears the pt has newly diagnosed DM - will need to be counseled and educated - may not yet require tx outside of diet control - follow CBG trend   Lonia Blood, MD [facility] Office  [phone] Pager [phone]  On-Call/Text Page:      [email or web address]      password [facility]

## 2011-05-06 NOTE — Progress Notes (Signed)
EEG bedside completed, w/ video.

## 2011-05-07 ENCOUNTER — Inpatient Hospital Stay (HOSPITAL_COMMUNITY): Payer: Self-pay

## 2011-05-07 ENCOUNTER — Encounter (HOSPITAL_COMMUNITY): Payer: Self-pay | Admitting: Gastroenterology

## 2011-05-07 LAB — GLUCOSE, CAPILLARY
Glucose-Capillary: 113 mg/dL — ABNORMAL HIGH (ref 70–99)
Glucose-Capillary: 138 mg/dL — ABNORMAL HIGH (ref 70–99)

## 2011-05-07 LAB — COMPREHENSIVE METABOLIC PANEL
ALT: 11 U/L (ref 0–53)
BUN: 9 mg/dL (ref 6–23)
Calcium: 9 mg/dL (ref 8.4–10.5)
Creatinine, Ser: 0.91 mg/dL (ref 0.50–1.35)
GFR calc Af Amer: >90 mL/min (ref >90)
Glucose, Bld: 102 mg/dL — ABNORMAL HIGH (ref 70–99)
Sodium: 140 mEq/L (ref 135–145)
Total Protein: 6.2 g/dL (ref 6.0–8.3)

## 2011-05-07 LAB — MAGNESIUM: Magnesium: 2.2 mg/dL (ref 1.5–2.5)

## 2011-05-07 LAB — CBC
Hemoglobin: 9.9 g/dL — ABNORMAL LOW (ref 13.0–17.0)
MCH: 29.6 pg (ref 26.0–34.0)
MCHC: 33.3 g/dL (ref 30.0–36.0)
MCV: 88.9 fL (ref 78.0–100.0)

## 2011-05-07 MED ORDER — SODIUM CHLORIDE 0.9 % IJ SOLN
INTRAMUSCULAR | Status: AC
  Administered 2011-05-07: 10 mL
  Filled 2011-05-07: qty 20

## 2011-05-07 MED ORDER — FERROUS SULFATE 325 (65 FE) MG PO TABS: 325.0000 mg | ORAL_TABLET | Freq: Two times a day (BID) | ORAL | Status: DC

## 2011-05-07 MED ORDER — SODIUM CHLORIDE 0.9 % IJ SOLN
INTRAMUSCULAR | Status: AC
  Administered 2011-05-07: 05:00:00
  Filled 2011-05-07: qty 20

## 2011-05-07 MED ORDER — GADOBENATE DIMEGLUMINE 529 MG/ML IV SOLN
18.0000 mL | Freq: Once | INTRAVENOUS | Status: AC
  Administered 2011-05-07: 18 mL via INTRAVENOUS

## 2011-05-07 NOTE — Procedures (Signed)
EEG ID:  [id number].  HISTORY:  This is a 61 years old man with history of epilepsy.  MEDICATIONS:  No anticonvulsant medications.  CONDITION OF RECORDING:  This 16-lead EEG was recorded with the patient in an awake and drowsy states.  Background rhythm: background patterns in wakefulness were well organized with a well-sustained posterior dominant rhythm of 8 to 8.5 Hz, symmetrical and reactive to opening and closing.  Drowsiness was associated with mild attenuation in voltage and slowing frequencies.  Abnormal potentials: no epileptiform activity or focal slowing was noted.  ACTIVATION PROCEDURES:  Hyperventilation was not performed.  Photic stimulation did not activate tracing.  EKG:  Single-channel of EKG monitoring was unremarkable.  IMPRESSION:  This was a normal awake and drowsy EEG.  A normal EEG does not rule out the clinical diagnosis of epilepsy.  If clinically warranted, a repeat extended EEG or ambulatory recording may be obtained for prolonged recording times and sleep capture, which may increase the diagnostic yield.  Clinical correlation is suggested.          ______________________________ Carmell Austria, MD    QI:ONGE D:  05/06/2011 14:31:59  T:  05/06/2011 21:51:09  Job #:  [id number]

## 2011-05-07 NOTE — Discharge Summary (Signed)
DISCHARGE SUMMARY  Thomas Kline  Thomas#: [id number]  DOB:[birth year]  Date of Admission: 05/03/2011 Date of Discharge: 05/07/2011  Attending Physician:Shadara Lopez  Patient's PCP:No primary provider on file.  Consults: GI- Dr Jeani Hawking  Presenting Complaints: Rectal bleed, Chest pain  Discharge Diagnoses: Acute blood loss anemia  Tachycardia  Acute lower GI bleeding  Leukocytosis  Seizure  Chest pain    Discharge Medications: Medication List  As of 05/07/2011  5:27 PM   TAKE these medications         ferrous sulfate 325 (65 FE) MG tablet   Take 1 tablet (325 mg total) by mouth 2 (two) times daily.             Procedures: X-ray Chest Pa And Lateral   05/03/2011  *RADIOLOGY REPORT*  Clinical Data: Altered consciousness and chest pain.  CHEST - 2 VIEW  Comparison: 09/04/2010  Findings: Lateral view degraded by patient arm position.  Midline trachea.  Borderline cardiomegaly.     Mediastinal contours otherwise within normal limits.  No pleural effusion or pneumothorax.  Clear lungs.  IMPRESSION: Borderline cardiomegaly, without acute disease.  Original Report Authenticated By: Consuello Bossier, M.D.   Thomas Kline GN Contrast  05/07/2011  *RADIOLOGY REPORT*  Clinical Data: Seizure.  MRI HEAD WITHOUT AND WITH CONTRAST  Technique:  Multiplanar, multiecho pulse sequences of the brain and surrounding structures were obtained according to standard protocol without and with intravenous contrast  Contrast: 18mL MULTIHANCE GADOBENATE DIMEGLUMINE 529 MG/ML IV SOLN  Comparison: 09/04/2010 head CT.  No comparison Thomas.  Findings: No acute infarct.  No intracranial hemorrhage.  No evidence of mesial temporal sclerosis.  Mild atrophy without hydrocephalus.  Mild to slightly moderate small vessel disease type changes.  Minimal enhancement of the left internal auditory canal may represent a vessel.  Tiny acoustic neuroma not entirely excluded (series 12 image 14) although felt to be less likely  consideration. Otherwise no evidence of intracranial mass or abnormal enhancement otherwise detected.  Prominent opacification of the paranasal sinuses and nasal vault with polypoid appearance.  Abnormal diffusion sequence.  This may represent result of inspissated material within the paranasal sinus opacification.  No obvious intracranially or orbital extension. Direct visualization of nasal vault may be considered for further evaluation.  Major intracranial vascular structures are patent.  Heterogeneous bone marrow upper cervical spine may be related to underlying anemia.  Correlation with CBC recommended.  IMPRESSION: Prominent paranasal sinus and nasal vault opacification as noted above.  Probable vessel within the inferior aspect of the left internal auditory canal rather than small acoustic neuroma.  Small vessel disease type changes.  No acute infarct.  No seizure focus identified.  Please see above.  Original Report Authenticated By: Fuller Canada, M.D.   Ct Abdomen Pelvis W Contrast  05/02/2011  *RADIOLOGY REPORT*  Clinical Data: Rectal bleeding.  Abdominal pain.  Anemia.  CT ABDOMEN AND PELVIS WITH CONTRAST  Technique:  Multidetector CT imaging of the abdomen and pelvis was performed following the standard protocol during bolus administration of intravenous contrast.  Contrast: OMNIPAQUE IOHEXOL 300 MG/ML IV SOLN  Comparison: None.  Findings: The lung bases are clear without focal nodule, mass, or airspace disease.  Heart size is normal.  No significant pleural or pericardial effusion is present.  The liver and spleen are within normal limits.  The stomach, duodenum, and pancreas are normal.  The common bile duct and gallbladder are within normal limits. The adrenal glands are normal bilaterally.  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 Bilateral simple cysts are present in the kidneys.  The ureters are within normal limits throughout their course to the urinary bladder.  The wall  of the urinary bladder appears diffusely thickened.  No discrete mass lesion is evident.  The rectum is unremarkable.  No significant adenopathy is present. The sigmoid colon is within normal limits.  The remainder of the colon is within normal limits as well.  The appendix is visualized and normal.  The small bowel is unremarkable.  No significant adenopathy or free fluid is present.  Minimal atherosclerotic calcifications are noted.  The bone windows are within normal limits.  IMPRESSION:  1.  No significant rectal mass or adenopathy is present. 2.  Diffuse thickening of the bladder wall.  This could be seen with in the setting of cystitis or urinary tract infection. 3.  Bilateral renal cysts. 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  Original Report Authenticated By: Jamesetta Orleans. MATTERN, M.D.    Echocardiogram: Study Conclusions  - Left ventricle: The cavity size was normal. There was mild concentric hypertrophy. Systolic function was normal. The estimated ejection fraction was in the range of 60% to 65%. Left ventricular diastolic function parameters were normal. - Mitral valve: Mild regurgitation.  Colonoscopy: 05/06/11 IMPRESSION: 1) Polyp, multiple.  2) Internal and external hemorrhoids.  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   Hospital Course: Thomas Kline presented to the ER initially due to a rectal bleed but left AMA. He returned on the following day with chest pain radiating to his left arm. He was further noted to have a Tonic/ Clonic seizure while in the ER.  Principal Problem:  *Acute blood loss anemia/ Acute lower GI bleeding He admitted to having painless bloody stools on and off for about 3-4 days. On admission her Hgb was 5.8. He required a total of 4 untis of blood. His bleeding resolved on day 2 of admission. A GI consult was requested and the colonoscopy reveal the above mentioned findings. He had biopsies preformed and will need to follow up with Dr Elnoria Howard  as an outpt for the results.  His hemoglobin on discharge is 9.9 and has been stable for the past few days. He is tolerating a regular diet and has no discomfort.   Active Problems:  Tachycardia-  Related to blood loss and now resolved.   Leukocytosis Resolved without specific treatment. No infectious etiology found.    Seizure Further work up for this included an MRI and an EEG both of which are negative for an acute etiology. There is no official EEG report but I have been told by Dr Lyman Speller that it was normal. The patient is advised that if he has further seizures he will need treatment. At this point, his seizure may have been in relation to cerebral hypoxia from blood loss.    Chest pain Also likely related to hypoxia from anemia. Troponins were negative and his pain resolved when his Hematocrit improved with blood transfusions.     Day of Discharge Physical Exam: BP 117/67  Pulse 72  Temp(Src) 98.2 F (36.8 C) (Oral)  Resp 18  Ht 6\' 1"  (1.854 m)  Wt 86.818 kg (191 lb 6.4 oz)  BMI 25.25 kg/m2  SpO2 97% General: Alert, awake, oriented x3, in no acute distress.  HEENT: No bruits, no goiter.  Heart: Regular rate and rhythm, without murmurs, rubs, gallops.  Lungs: Clear to auscultation bilaterally.  Abdomen:  Soft, nontender, nondistended, positive bowel sounds.  Extremities: No clubbing cyanosis or edema with positive pedal pulses.  Neuro: Grossly intact, nonfocal.   Results for orders placed during the hospital encounter of 05/03/11 (from the past 24 hour(s))  CBC     Status: Abnormal   Collection Time   05/07/11  5:05 AM      Component Value Range   WBC 7.8  4.0 - 10.5 (K/uL)   RBC 3.34 (*) 4.22 - 5.81 (MIL/uL)   Hemoglobin 9.9 (*) 13.0 - 17.0 (g/dL)   HCT 91.4 (*) 78.2 - 52.0 (%)   MCV 88.9  78.0 - 100.0 (fL)   MCH 29.6  26.0 - 34.0 (pg)   MCHC 33.3  30.0 - 36.0 (g/dL)   RDW 95.6 (*) 21.3 - 15.5 (%)   Platelets 364  150 - 400 (K/uL)  COMPREHENSIVE METABOLIC PANEL      Status: Abnormal   Collection Time   05/07/11  5:05 AM      Component Value Range   Sodium 140  135 - 145 (mEq/L)   Potassium 3.8  3.5 - 5.1 (mEq/L)   Chloride 107  96 - 112 (mEq/L)   CO2 26  19 - 32 (mEq/L)   Glucose, Bld 102 (*) 70 - 99 (mg/dL)   BUN 9  6 - 23 (mg/dL)   Creatinine, Ser 0.86  0.50 - 1.35 (mg/dL)   Calcium 9.0  8.4 - 57.8 (mg/dL)   Total Protein 6.2  6.0 - 8.3 (g/dL)   Albumin 3.0 (*) 3.5 - 5.2 (g/dL)   AST 15  0 - 37 (U/L)   ALT 11  0 - 53 (U/L)   Alkaline Phosphatase 67  39 - 117 (U/L)   Total Bilirubin 0.4  0.3 - 1.2 (mg/dL)   GFR calc non Af Amer 89 (*) >90 (mL/min)   GFR calc Af Amer >90  >90 (mL/min)  MAGNESIUM     Status: Normal   Collection Time   05/07/11  5:05 AM      Component Value Range   Magnesium 2.2  1.5 - 2.5 (mg/dL)  GLUCOSE, CAPILLARY     Status: Abnormal   Collection Time   05/07/11  7:40 AM      Component Value Range   Glucose-Capillary 113 (*) 70 - 99 (mg/dL)   Comment 1 Notify RN     Comment 2 Documented in Chart    GLUCOSE, CAPILLARY     Status: Abnormal   Collection Time   05/07/11 12:25 PM      Component Value Range   Glucose-Capillary 116 (*) 70 - 99 (mg/dL)   Comment 1 Notify RN     Comment 2 Documented in Chart      Disposition: stable for d/c  Follow-up Appts: Discharge Orders    Future Orders Please Complete By Expires   Diet - low sodium heart healthy      Increase activity slowly      Discharge instructions      Comments:   Please find a primary care doctor and see him or her in 1-2 months to ensure that your blood count is improving.  Go back to see Dr Elnoria Howard for the biopsy results from your colonoscopy.      Follow-up with Dr. Jeani Hawking in 1-2 weeks.   Time on Discharge: >45 min Signed: Calvert Cantor 05/07/2011, 5:27 PM

## 2011-05-07 NOTE — Progress Notes (Signed)
Utilization Review Completed.Damico Partin T2/19/2013   

## 2011-05-07 NOTE — Progress Notes (Signed)
Discharge instructions given to patient.  Pt verbalizes understanding.  Pt states he will be taking bus home and does have fare for the ride.  Pt walked out to bus stop.  Roselie Awkward, RN

## 2011-12-09 ENCOUNTER — Encounter: Payer: Self-pay | Admitting: Family Medicine

## 2011-12-09 ENCOUNTER — Ambulatory Visit (INDEPENDENT_AMBULATORY_CARE_PROVIDER_SITE_OTHER): Payer: Self-pay | Admitting: Family Medicine

## 2011-12-09 VITALS — BP 135/80 | HR 80 | Temp 98.3°F | Ht 73.0 in | Wt 202.8 lb

## 2011-12-09 DIAGNOSIS — R0789 Other chest pain: Secondary | ICD-10-CM

## 2011-12-09 DIAGNOSIS — R109 Unspecified abdominal pain: Secondary | ICD-10-CM

## 2011-12-09 DIAGNOSIS — R071 Chest pain on breathing: Secondary | ICD-10-CM

## 2011-12-09 DIAGNOSIS — R42 Dizziness and giddiness: Secondary | ICD-10-CM | POA: Insufficient documentation

## 2011-12-09 DIAGNOSIS — J3489 Other specified disorders of nose and nasal sinuses: Secondary | ICD-10-CM

## 2011-12-09 DIAGNOSIS — I10 Essential (primary) hypertension: Secondary | ICD-10-CM

## 2011-12-09 DIAGNOSIS — R0981 Nasal congestion: Secondary | ICD-10-CM

## 2011-12-09 DIAGNOSIS — J339 Nasal polyp, unspecified: Secondary | ICD-10-CM | POA: Insufficient documentation

## 2011-12-09 DIAGNOSIS — D62 Acute posthemorrhagic anemia: Secondary | ICD-10-CM

## 2011-12-09 DIAGNOSIS — D649 Anemia, unspecified: Secondary | ICD-10-CM

## 2011-12-09 LAB — CBC WITH DIFFERENTIAL/PLATELET
Basophils Absolute: 0 10*3/uL (ref 0.0–0.1)
Basophils Relative: 0 % (ref 0–1)
Eosinophils Absolute: 0.4 10*3/uL (ref 0.0–0.7)
Eosinophils Relative: 6 % — ABNORMAL HIGH (ref 0–5)
HCT: 36 % — ABNORMAL LOW (ref 39.0–52.0)
MCHC: 34.7 g/dL (ref 30.0–36.0)
MCV: 86.3 fL (ref 78.0–100.0)
Monocytes Absolute: 0.4 10*3/uL (ref 0.1–1.0)
Platelets: 287 10*3/uL (ref 150–400)
RDW: 13 % (ref 11.5–15.5)

## 2011-12-09 MED ORDER — TRAMADOL HCL 50 MG PO TABS: 50.0000 mg | ORAL_TABLET | Freq: Three times a day (TID) | ORAL | Status: DC | PRN

## 2011-12-09 NOTE — Assessment & Plan Note (Signed)
History of acute blood loss anemia from rectal bleeding in February 2013. Patient not taking ferrous sulfate. With symptoms of lightheadedness and fatigue, will check CBC today to rule out significant anemia. Patient denies any active bleeding at this time.

## 2011-12-09 NOTE — Progress Notes (Signed)
Patient ID: Thomas Kline, male   DOB: 03/03/1951, 61 y.o.   MRN: [id number]  Chief Complaint  Patient presents with  . New patient   HPI Thomas Kline is a 61 y.o. male who presents as a new patient to establish care. His concerns are the following: - left side pain: located under his left arm along the lateral chest wall. Occurs after 3-5 hours of work but is non exertional.  Is not located in the anterior chest wall. Not associated with shortness of breath. He has had this pain for over 1 year and had some chest pain during a hospitalization in February with negative troponins and no acute changes on EKG. He thinks it feels like gas pain and feels like it has gotten slightly worst in frequency in the last few months.  - nasal congestion: associated with difficulty breathing and occasional headaches. Has not taken anything for it. No fevers, no chills.  - dizziness and fatigue: feels lightheaded in the morning when he wakes up. Denies any associated headache. Denies any loss of consciousness of seizure activity. No change in vision. No focal weakness. Worst with standing. Resolves with sitting. No associated chest pain or shortness of breath. Has history of anemia but has recently not been taking iron.   - history of pan diverticulosis: history of rectal bleeding for which he was admitted in February, but denies any recent diarrhea or rectal bleeding. Bowel movements have been soft and non bloody.   Past Medical History  Diagnosis Date  . Seizures   . Pancolonic diverticulosis 05/06/11    diagnosed on colonoscopy from 04/2011 along with multiple polyps and internal and external hemorrhoids.   . Intestinal polyps     colonoscopy from 05/06/11: multiple polyps and internal and external hemorrhoids. along with pan diverticulosis    Past Surgical History  Procedure Date  . Colonoscopy 05/06/2011    Procedure: COLONOSCOPY;  Surgeon: Theda Belfast, MD;  Location[facility]COPY;  Service: Endoscopy;   Laterality: N/A;    History reviewed. No pertinent family history.  Social History History  Substance Use Topics  . Smoking status: Never Smoker   . Smokeless tobacco: Not on file  . Alcohol Use: 0.6 oz/week    1 Cans of beer per week    No Known Allergies  Current Outpatient Prescriptions  Medication Sig Dispense Refill  . ferrous sulfate 325 (65 FE) MG tablet Take 1 tablet (325 mg total) by mouth 2 (two) times daily.  30 tablet  11   Current Facility-Administered Medications  Medication Dose Route Frequency Provider Last Rate Last Dose  . traMADol (ULTRAM) tablet 50 mg  50 mg Oral Q8H PRN Lonia Skinner, MD        Review of Systems Review of Systems Negative except per HPI Blood pressure 135/80, pulse 80, temperature 98.3 F (36.8 C), temperature source Oral, height 6\' 1"  (1.854 m), weight 202 lb 12.8 oz (91.989 kg).  Physical Exam Physical Exam  Constitutional: He is oriented to person, place, and time. He appears well-developed. No distress.  HENT:  Mouth/Throat: Oropharynx is clear and moist. No oropharyngeal exudate.       Significant erythema and congestion in left nasal turbinate obstructing nostril, given size, difficult to differentiate from nasal polyp. Erythema and congestion present in right nasal turbinate although less than left.  Eyes: EOM are normal. Pupils are equal, round, and reactive to light.  Neck: Normal range of motion. Neck supple. No thyromegaly present.  Cardiovascular: Normal  rate, regular rhythm and normal heart sounds.   Pulmonary/Chest: Effort normal and breath sounds normal. No respiratory distress. He has no wheezes. He has no rales.  Abdominal: Soft. Bowel sounds are normal. He exhibits no distension. There is no tenderness. There is no rebound and no guarding.  Musculoskeletal: He exhibits no edema and no tenderness.       No tenderness to palpation along left rib cage area or along chest wall  Neurological: He is alert and oriented to  person, place, and time. No cranial nerve deficit.  Skin: Skin is warm and dry.  Psychiatric: He has a normal mood and affect.    Data Reviewed Discharge summary from 05/06/11 hospitalization.  CMP     Component Value Date/Time   NA 140 05/07/2011 0505   K 3.8 05/07/2011 0505   CL 107 05/07/2011 0505   CO2 26 05/07/2011 0505   GLUCOSE 102* 05/07/2011 0505   BUN 9 05/07/2011 0505   CREATININE 0.91 05/07/2011 0505   CALCIUM 9.0 05/07/2011 0505   PROT 6.2 05/07/2011 0505   ALBUMIN 3.0* 05/07/2011 0505   AST 15 05/07/2011 0505   ALT 11 05/07/2011 0505   ALKPHOS 67 05/07/2011 0505   BILITOT 0.4 05/07/2011 0505   GFRNONAA 89* 05/07/2011 0505   GFRAA >90 05/07/2011 0505   CBC    Component Value Date/Time   WBC 7.8 05/07/2011 0505   RBC 3.34* 05/07/2011 0505   HGB 9.9* 05/07/2011 0505   HCT 29.7* 05/07/2011 0505   PLT 364 05/07/2011 0505   MCV 88.9 05/07/2011 0505   MCH 29.6 05/07/2011 0505   MCHC 33.3 05/07/2011 0505   RDW 15.7* 05/07/2011 0505   LYMPHSABS 1.3 05/03/2011 1907   MONOABS 0.9 05/03/2011 1907   EOSABS 0.0 05/03/2011 1907   BASOSABS 0.0 05/03/2011 1907       Assessment    See problem list    Plan    See problem list       Roopa Graver 12/09/2011, 11:37 AM

## 2011-12-09 NOTE — Assessment & Plan Note (Signed)
Atypical chest pain. Pain doesn't appear cardiac in nature given that it is non exertional, not associated with dyspnea. Likely musculoskeletal or gastrointestinal. Will empirically treat pain with ultram. Also told patient he could try simethicone if he thinks it is gas pains. Symptoms do not sound like GERD either. Follow up in 2-3 weeks.

## 2011-12-09 NOTE — Patient Instructions (Addendum)
For the nasal congestion, you can take generic medicine called: cetirizine or loratadine, once a day.  For the pain under your arm, I am going to prescribe a medicine called tramadol to take every 8 hours as needed for pain. If you think it's gas, you can take a medicine over the counter: simethicone or gas X.  We will get some lab work to check your blood count for anemia. I will call you to let you know of the results.  I'll see you back in 2-3 weeks.

## 2011-12-09 NOTE — Assessment & Plan Note (Addendum)
Initially significantly elevated at 189/84. Repeat bp 150/70. Will not treat for now. Will follow up in 2-3 weeks and treat if continues to be elevated then.

## 2011-12-09 NOTE — Assessment & Plan Note (Signed)
Severe nasal congestion. Recommended over the counter antihistamine. Patient doesn't have insurance and would not be able to afford nasal steroid. Gave info for MAP in case he ends up needing nasal steroid

## 2011-12-09 NOTE — Assessment & Plan Note (Addendum)
Orthostatics were normal. No focal neuro deficits on exam. This could be associated with anemia. Will check CBC today. Follow up in 3 weeks.

## 2011-12-17 ENCOUNTER — Encounter: Payer: Self-pay | Admitting: Family Medicine

## 2012-03-20 ENCOUNTER — Ambulatory Visit (INDEPENDENT_AMBULATORY_CARE_PROVIDER_SITE_OTHER): Payer: Self-pay | Admitting: Family Medicine

## 2012-03-20 ENCOUNTER — Encounter: Payer: Self-pay | Admitting: Family Medicine

## 2012-03-20 VITALS — BP 146/85 | HR 83 | Ht 73.0 in | Wt 201.0 lb

## 2012-03-20 DIAGNOSIS — R198 Other specified symptoms and signs involving the digestive system and abdomen: Secondary | ICD-10-CM

## 2012-03-20 DIAGNOSIS — R0981 Nasal congestion: Secondary | ICD-10-CM

## 2012-03-20 DIAGNOSIS — R7303 Prediabetes: Secondary | ICD-10-CM

## 2012-03-20 DIAGNOSIS — I10 Essential (primary) hypertension: Secondary | ICD-10-CM

## 2012-03-20 DIAGNOSIS — J3489 Other specified disorders of nose and nasal sinuses: Secondary | ICD-10-CM

## 2012-03-20 DIAGNOSIS — R7309 Other abnormal glucose: Secondary | ICD-10-CM

## 2012-03-20 MED ORDER — FLUTICASONE PROPIONATE 50 MCG/ACT NA SUSP: 2.0000 | Freq: Every day | NASAL | Status: DC

## 2012-03-20 MED ORDER — LISINOPRIL 10 MG PO TABS: 10.0000 mg | ORAL_TABLET | Freq: Every day | ORAL | Status: DC

## 2012-03-20 NOTE — Patient Instructions (Addendum)
For the nose congestion, I would like to start you on a spray and I am giving you a prescription for it that you can fill as soon as you see the people from the medication assistance program and the orange card.   For the stomach problem, I am starting a medicine called: ranitidine (zantac) 150mg  to take twice in a day. We will see how you do when you come back.   For the blood pressure, I will start a medicine called lisinopril 10mg  daily. Please come back in 3 weeks for lab work and to check your blood pressure.   Today, we are going to check a finger stick to check for diabetes.

## 2012-03-22 ENCOUNTER — Encounter: Payer: Self-pay | Admitting: Family Medicine

## 2012-03-22 DIAGNOSIS — K219 Gastro-esophageal reflux disease without esophagitis: Secondary | ICD-10-CM | POA: Insufficient documentation

## 2012-03-22 NOTE — Progress Notes (Signed)
Patient ID: Cayce Quezada    DOB: 28-Apr-1950, 62 y.o.   MRN: [id number] --- Subjective:  Kortney is a 62 y.o.male who presents for follow up on nasal congestion and stomach discomfort.  - nasal congestion: x 1 year. Congestion in both nostrils L>R, making it difficult to breathe at times. o cough. Spits up mucus from post nasal drip. No fever. No headache. No sore throat or ear ache. Has tried claritin which did not help.  - stomach discomfort: feeling bloated and "full" on left upper side of stomach after eating. No burning, no pain. No diarrhea, no constipation.   ROS: see HPI Past Medical History: reviewed and updated medications and allergies. Social History: Tobacco: never smoker  Objective: Filed Vitals:   03/20/12 1339  BP: 146/85  Pulse: 83    Physical Examination:   General appearance - alert, well appearing, and in no distress Ears - bilateral TM's and external ear canals normal Nose - unchanged significant left sided erythematous swelling and congestion, obstructing nostril. Erythema and congestion present on right but not as significant as left.   Mouth - mucous membranes moist, pharynx normal without lesions Chest - clear to auscultation, no wheezes, rales or rhonchi, symmetric air entry Heart - normal rate, regular rhythm, normal S1, S2, no murmurs, rubs, clicks or gallops Abdomen - soft, nontender, nondistended Extremities - peripheral pulses normal, no pedal edema, no clubbing or cyanosis

## 2012-03-22 NOTE — Assessment & Plan Note (Signed)
Difficult to distinguish between nasal congestion and nasal polyp. Patient to get in touch with MAP and apply for orange card so that he can obtain flonase. Gave Rx for flonase for patient to fill as soon as he has the means to fill medicine.

## 2012-03-22 NOTE — Assessment & Plan Note (Signed)
Persistently elevated in the 140's systolic. Patient had A1C at 6.3 and repeat at 6.1, putting him in the pre-diabetes range. As such, will treat BP with lisinopril 10mg  daily. Creatinie normal in the past. BMP at follow up in 2-3 weeks.

## 2012-03-22 NOTE — Assessment & Plan Note (Addendum)
Given description of discomfort after eating, will empirically treat for reflux with ranitidine (more affordable). Follow up in 3 weeks when patient comes for BP check.

## 2012-05-04 ENCOUNTER — Other Ambulatory Visit: Payer: Self-pay

## 2012-05-04 VITALS — BP 156/79 | HR 86

## 2012-05-04 DIAGNOSIS — I10 Essential (primary) hypertension: Secondary | ICD-10-CM

## 2012-05-04 LAB — BASIC METABOLIC PANEL
BUN: 13 mg/dL (ref 6–23)
Calcium: 9.5 mg/dL (ref 8.4–10.5)
Creat: 0.9 mg/dL (ref 0.50–1.35)
Glucose, Bld: 98 mg/dL (ref 70–99)

## 2012-05-04 NOTE — Progress Notes (Signed)
Patient came into office today requesting labs.  Per last office note on 03/20/12---patient to have BMET and BP check.  Labs drawn and BP check done.  Follow-up appt scheduled with Dr. Gwenlyn Saran for 05/13/12 at 1:45 pm.  Gaylene Brooks, RN

## 2012-05-04 NOTE — Progress Notes (Signed)
BMP DONE TODAY Thomas Kline 

## 2012-05-13 ENCOUNTER — Ambulatory Visit (INDEPENDENT_AMBULATORY_CARE_PROVIDER_SITE_OTHER): Payer: Self-pay | Admitting: Family Medicine

## 2012-05-13 VITALS — BP 145/77 | HR 82 | Ht 73.0 in | Wt 193.0 lb

## 2012-05-13 DIAGNOSIS — I10 Essential (primary) hypertension: Secondary | ICD-10-CM

## 2012-05-13 DIAGNOSIS — R0981 Nasal congestion: Secondary | ICD-10-CM

## 2012-05-13 DIAGNOSIS — K219 Gastro-esophageal reflux disease without esophagitis: Secondary | ICD-10-CM

## 2012-05-13 NOTE — Progress Notes (Signed)
Patient ID: Thomas Kline    DOB: 01/18/1951, 62 y.o.   MRN: [id number] --- Subjective:  Thomas Kline is a 62 y.o.male who presents for follow up: - BP: had discussed starting lisinopril but patient did not take it because he got scared of side effects it could have on kidney. Denies any chest pain or shortness of breath.   - discomfort on left side: under shoulder, all the time more in morning. Started in 2012. Normal strength. No change in symptoms.  - nasal congestion: still the same. Has not had appointment for orange card and can therefore not afford steroid nasal spray.  - heartburn: better with zantac once a day. No nausea, no vomiting, no melena, no feeling of food being tuck.  - appetite loss: eats only one meal, x more than 1 year.   ROS: see HPI Past Medical History: reviewed and updated medications and allergies. Social History: Tobacco: denies Alcohol: none Objective: Filed Vitals:   05/13/12 1340  BP: 145/77  Pulse: 82    Physical Examination:   General appearance - alert, well appearing, and in no distress Ears - bilateral TM's and external ear canals normal Nose - left nasal polyp, right nasal turbinate with congestion and erythema.  Mouth - mucous membranes moist, pharynx normal without lesions Neck - supple, no significant adenopathy Chest - clear to auscultation, no wheezes, rales or rhonchi, symmetric air entry Heart - normal rate, regular rhythm, normal S1, S2, no murmurs, rubs, clicks or gallops Abdomen - soft, nontender, nondistended, no masses or organomegaly, left flank is non tender, no masses.  Extremities - peripheral pulses normal, no pedal edema, no clubbing or cyanosis  BMET    Component Value Date/Time   NA 141 05/04/2012 1015   K 3.9 05/04/2012 1015   CL 106 05/04/2012 1015   CO2 25 05/04/2012 1015   GLUCOSE 98 05/04/2012 1015   BUN 13 05/04/2012 1015   CREATININE 0.90 05/04/2012 1015   CREATININE 0.91 05/07/2011 0505   CALCIUM 9.5 05/04/2012 1015   GFRNONAA 89* 05/07/2011 0505   GFRAA >90 05/07/2011 0505

## 2012-05-13 NOTE — Patient Instructions (Signed)
For the heartburn, you can take zantac (ranitidine) in the morning and at night.   It is very important that you meet with Britta Mccreedy to get the orange card because otherwise we cannot get the medicine that you need.  Follow up in 3 months or sooner if needed.

## 2012-05-15 NOTE — Assessment & Plan Note (Signed)
From nasal polyp. Will be important to treat with nasal steroid but difficult at this time due to lack of orange card. There is a new over the counter triamcinolone nasal spray but is not listed as treatment for nasal polyp.

## 2012-05-15 NOTE — Assessment & Plan Note (Signed)
Can increase to ranitidine twice daily from daily.

## 2012-05-15 NOTE — Assessment & Plan Note (Addendum)
Patient hesitant to start antihypertensive therapy at this time. SBP<150. Will hold on treatment for now but will have low threshold to start medication. BMP wnl.

## 2012-08-03 ENCOUNTER — Encounter: Payer: Self-pay | Admitting: Family Medicine

## 2012-08-03 ENCOUNTER — Ambulatory Visit (INDEPENDENT_AMBULATORY_CARE_PROVIDER_SITE_OTHER): Payer: PRIVATE HEALTH INSURANCE | Admitting: Family Medicine

## 2012-08-03 VITALS — BP 171/94 | HR 78 | Temp 98.2°F | Ht 73.0 in | Wt 190.0 lb

## 2012-08-03 DIAGNOSIS — J3489 Other specified disorders of nose and nasal sinuses: Secondary | ICD-10-CM

## 2012-08-03 DIAGNOSIS — I10 Essential (primary) hypertension: Secondary | ICD-10-CM

## 2012-08-03 DIAGNOSIS — R0981 Nasal congestion: Secondary | ICD-10-CM

## 2012-08-03 MED ORDER — FLUTICASONE PROPIONATE 50 MCG/ACT NA SUSP: 2.0000 | Freq: Every day | NASAL | Status: DC

## 2012-08-03 MED ORDER — HYDROCHLOROTHIAZIDE 25 MG PO TABS: 25.0000 mg | ORAL_TABLET | Freq: Every day | ORAL | Status: DC

## 2012-08-03 NOTE — Assessment & Plan Note (Signed)
Large L nasal polyp, would likely have some improvement with flonase.  Advised to pick up and start, sent to [facility] as this appears to be the cheapest. Also givne information on the MAP program for future medications.

## 2012-08-03 NOTE — Progress Notes (Signed)
  Subjective:    Patient ID: Thomas Kline, male    DOB: 1950-08-10, 62 y.o.   MRN: [id number]  HPI  1. Nasal congestion: C/o frequent nasal congestion.  Has history of nasal polyp.  Rx for flonase given previously, however he has never gotten filled. Denies cough, fever, chills, nasal bleeding.  2. HTN: Has history of HTN but is not taking lisinopril because he does not want to take too much medication.  No self monitoring of blood pressure at home.  Denies chest pain, headache, vision changes, palpitations.  Review of Systems Per HPI    Objective:   Physical Exam  Constitutional: He appears well-nourished. No distress.  HENT:  Large left nasal polyp, almost obstructing L nare.  There is clear rhinorrhea from the R nare.   Cardiovascular: Normal rate and regular rhythm.   Musculoskeletal: He exhibits no edema.          Assessment & Plan:

## 2012-08-03 NOTE — Assessment & Plan Note (Signed)
Not compliant with lisinopril, worried about kidney function and taking too much medication.  Will change to HCTZ  And have him follow up with Dr. Gwenlyn Saran.

## 2012-08-03 NOTE — Patient Instructions (Signed)
Thank you for coming in today, it was good to see you You should apply to the Medication assistance program at the [facility] I am sending a nasal spray to [facility] for you to help with your congestion You may continue the zantac for your heartburn I am sending in a blood pressure medication for you, follow up with Dr. Gwenlyn Saran in 2 weeks

## 2013-05-03 ENCOUNTER — Encounter: Payer: Self-pay | Admitting: Family Medicine

## 2013-05-03 ENCOUNTER — Ambulatory Visit (INDEPENDENT_AMBULATORY_CARE_PROVIDER_SITE_OTHER): Payer: Self-pay | Admitting: Family Medicine

## 2013-05-03 VITALS — BP 182/91 | HR 86 | Temp 97.7°F | Ht 73.0 in | Wt 208.0 lb

## 2013-05-03 DIAGNOSIS — J339 Nasal polyp, unspecified: Secondary | ICD-10-CM

## 2013-05-03 DIAGNOSIS — I10 Essential (primary) hypertension: Secondary | ICD-10-CM

## 2013-05-03 DIAGNOSIS — K219 Gastro-esophageal reflux disease without esophagitis: Secondary | ICD-10-CM

## 2013-05-03 LAB — POCT GLYCOSYLATED HEMOGLOBIN (HGB A1C): Hemoglobin A1C: 5.8

## 2013-05-03 LAB — COMPREHENSIVE METABOLIC PANEL
ALBUMIN: 4.3 g/dL (ref 3.5–5.2)
ALT: 9 U/L (ref 0–53)
AST: 12 U/L (ref 0–37)
Alkaline Phosphatase: 88 U/L (ref 39–117)
BUN: 8 mg/dL (ref 6–23)
CALCIUM: 9.3 mg/dL (ref 8.4–10.5)
CHLORIDE: 101 meq/L (ref 96–112)
CO2: 31 meq/L (ref 19–32)
CREATININE: 0.87 mg/dL (ref 0.50–1.35)
Glucose, Bld: 99 mg/dL (ref 70–99)
POTASSIUM: 4.3 meq/L (ref 3.5–5.3)
Sodium: 141 mEq/L (ref 135–145)
Total Bilirubin: 0.3 mg/dL (ref 0.2–1.2)
Total Protein: 7.1 g/dL (ref 6.0–8.3)

## 2013-05-03 LAB — LIPID PANEL
CHOL/HDL RATIO: 4.6 ratio
CHOLESTEROL: 197 mg/dL (ref 0–200)
HDL: 43 mg/dL (ref >39)
LDL Cholesterol: 119 mg/dL — ABNORMAL HIGH (ref 0–99)
Triglycerides: 175 mg/dL — ABNORMAL HIGH (ref <150)
VLDL: 35 mg/dL (ref 0–40)

## 2013-05-03 MED ORDER — OMEPRAZOLE 20 MG PO CPDR: 20.0000 mg | DELAYED_RELEASE_CAPSULE | Freq: Every day | ORAL | Status: DC

## 2013-05-03 NOTE — Patient Instructions (Addendum)
For the blood pressure, start back on the hydrochlorothioazide 25mg  once a day.  Follow up with me in 3 weeks.   For the belly discomfort, let's try another medicine. Stop the zantac 150mg  and start prilosec 20mg  once a day.

## 2013-05-03 NOTE — Progress Notes (Signed)
Patient ID: Thomas Kline    DOB: [birth year], 63 y.o.   MRN: [id number] --- Subjective:  Zakariyah is a 63 y.o.male who presents for follow on hypertension as well as complain of abdominal bloating and discomfort.    \# Hypertension: Medications: hctz 25mg  daily which patient doesn't take because he doesn't like medicine and is afraid of the side effects of the medicine.  BP at home: doesn't check Exercise routine: none Chest pain: none Lower extremity swelling: none Shortness of breath: none, able to go up flight of stairs without difficulty Headache: none2 Change in vision: none  # Abdominal discomfort: states that he has been having some burning of his esophagus during and after eating. Also feeling bloated. Denies any feeling that food is stuck. Denies any melenous stools. No red blood per rectum. bm once a day or every other day. Soft bm. No nausea, no vomiting.  He takes ranitidine 150mg  once a day which has helped some lower abdominal discomfort but not this particular pain.   #nasal polyp: continues to be symptomatic and bothersome to breathe through nose. He states he has tried nasal steroid sprays which have not worked.    ROS: see HPI Past Medical History: reviewed and updated medications and allergies. Social History: Tobacco: none  Objective: Filed Vitals:   05/03/13 1122  BP: 182/91  Pulse: 86  Temp: 97.7 F (36.5 C)    Physical Examination:   General appearance - alert, well appearing, and in no distress Nose - large left sided nasal polyp that obstructs left nare, congestion and erythema of right nasal turbinate. Mouth - mucous membranes moist, pharynx normal without lesions Chest - clear to auscultation, no wheezes, rales or rhonchi, symmetric air entry Heart - normal rate, regular rhythm, normal S1, S2, no murmurs Abdomen - soft, nontender, nondistended, no masses or organomegaly, negative Murphy's/  Extremities - trace pedal edema

## 2013-05-04 ENCOUNTER — Encounter: Payer: Self-pay | Admitting: Family Medicine

## 2013-05-04 NOTE — Assessment & Plan Note (Signed)
Trial of flonase unsuccessful. Given size of polyp as well as symptoms of difficulty breathing from it, will refer to ENT through orange card program.

## 2013-05-04 NOTE — Assessment & Plan Note (Signed)
Suspect abdominal discomfort from GERD.  - will stop zantac and start prilosec. Gave coupon for cheaper prilosec.  - no record of EGD. If persistent symptoms, could consider GI referral but since patient has no weight loss, melena, difficulty swallowing, will hold for now.   Reviewed colonoscopy results from February 2013 that showed polyps, diverticulosis and recommended repeat in 3-5 years.

## 2013-05-04 NOTE — Assessment & Plan Note (Addendum)
Uncontrolled due to non compliance. Explained to patient risks of heart attack and stroke in uncontrolled hypertension. Explained that side effects of hctz are less than risks of uncontrolled HTN>  Patient expressed understanding and agreed to starting back hctz 25mg  daily.  Will check lipid panel and CMP today as well as a1c.  No red flags on history or exam warranting urgent reduction of BP in office Follow up in 3 weeks

## 2013-05-07 ENCOUNTER — Telehealth: Payer: Self-pay | Admitting: *Deleted

## 2013-05-07 NOTE — Telephone Encounter (Signed)
Called pt. Re: ENT referral. LMVM to call us back. Please tell pt: Per [employer]: pt does not have insurance and in order to be seen at [facility] he has to pay $200-$300 at his first visit.  He can call and schedule his appt.# [phone].  Thank you. Javier Glazier, Gerrit Heck

## 2013-05-11 NOTE — Telephone Encounter (Signed)
Pt never called back. .Thomas Kline  

## 2013-05-19 ENCOUNTER — Encounter: Payer: Self-pay | Admitting: Family Medicine

## 2013-07-22 ENCOUNTER — Ambulatory Visit: Payer: PRIVATE HEALTH INSURANCE

## 2013-11-15 ENCOUNTER — Encounter: Payer: Self-pay | Admitting: Family Medicine

## 2013-11-15 ENCOUNTER — Ambulatory Visit (INDEPENDENT_AMBULATORY_CARE_PROVIDER_SITE_OTHER): Payer: PRIVATE HEALTH INSURANCE | Admitting: Family Medicine

## 2013-11-15 VITALS — BP 177/80 | HR 81 | Temp 98.3°F | Ht 73.0 in | Wt 202.6 lb

## 2013-11-15 DIAGNOSIS — K5730 Diverticulosis of large intestine without perforation or abscess without bleeding: Secondary | ICD-10-CM

## 2013-11-15 DIAGNOSIS — J339 Nasal polyp, unspecified: Secondary | ICD-10-CM

## 2013-11-15 DIAGNOSIS — R140 Abdominal distension (gaseous): Secondary | ICD-10-CM | POA: Insufficient documentation

## 2013-11-15 DIAGNOSIS — I10 Essential (primary) hypertension: Secondary | ICD-10-CM

## 2013-11-15 DIAGNOSIS — R143 Flatulence: Secondary | ICD-10-CM

## 2013-11-15 DIAGNOSIS — D62 Acute posthemorrhagic anemia: Secondary | ICD-10-CM

## 2013-11-15 DIAGNOSIS — R142 Eructation: Secondary | ICD-10-CM

## 2013-11-15 DIAGNOSIS — K219 Gastro-esophageal reflux disease without esophagitis: Secondary | ICD-10-CM

## 2013-11-15 DIAGNOSIS — R141 Gas pain: Secondary | ICD-10-CM

## 2013-11-15 LAB — CBC
HEMATOCRIT: 37.5 % — AB (ref 39.0–52.0)
HEMOGLOBIN: 12.4 g/dL — AB (ref 13.0–17.0)
MCH: 29.1 pg (ref 26.0–34.0)
MCHC: 33.1 g/dL (ref 30.0–36.0)
MCV: 88 fL (ref 78.0–100.0)
Platelets: 316 10*3/uL (ref 150–400)
RBC: 4.26 MIL/uL (ref 4.22–5.81)
RDW: 14.2 % (ref 11.5–15.5)
WBC: 6.4 10*3/uL (ref 4.0–10.5)

## 2013-11-15 MED ORDER — PANTOPRAZOLE SODIUM 40 MG PO TBEC: 40.0000 mg | DELAYED_RELEASE_TABLET | Freq: Every day | ORAL | Status: DC

## 2013-11-15 MED ORDER — HYDROCHLOROTHIAZIDE 25 MG PO TABS: 25.0000 mg | ORAL_TABLET | Freq: Every day | ORAL | Status: DC

## 2013-11-15 NOTE — Progress Notes (Signed)
Subjective:     Patient ID: Thomas Kline, male   DOB: [birth year], 63 y.o.   MRN: [id number]  HPI  Abdominal Bloating / Discomfort: - Reports prior hx of GERD with burning after eating, especially spicy foods or alcohol. Currently complains of worsening abdominal bloating, states he can "feel the bloating pressure as it goes down through his body and to his back, then it gets better and comes back again", worse after eating and at night - Previously on Zantac, since discontinued by previous PCP and started Omeprazole 20mg  daily without relief. Pt states "nothing makes it better" - Has not seen GI for this problem before - Denies any fevers/chills, abdominal pain, nausea, vomiting, hematemesis, bloody stools or blood per rectum, constipation, diarrhea  Hx Nasal Polyps: - Significant history of Left > Right nasal polyp - Complains of difficulty breathing out of Left nostril, "blocked", using Flonase without significant relief - Per Chart Review: Previously referred to ENT at last visit 05/03/13, however due to [facility] card, [facility] unable to schedule referral, pt was to call and schedule apt on own. No appointment had been scheduled  CHRONIC HTN: Reports no concerns Current Meds - HCTZ 25mg  daily   Reports good compliance, took meds today. Tolerating well, w/o complaints. Denies CP, dyspnea, HA, edema, dizziness / lightheadedness  I have reviewed and updated the following as appropriate: allergies and current medications  Social Hx: - Never smoker - From [place], has been in [place] x 8 years  Review of Systems  See above HPI    Objective:   Physical Exam  BP 177/80  Pulse 81  Temp(Src) 98.3 F (36.8 C) (Oral)  Ht 6\' 1"  (1.854 m)  Wt 202 lb 9.6 oz (91.899 kg)  BMI 26.74 kg/m2  Gen - well-appearing, NAD HEENT - NCAT, PERRL, EOMI, bilateral nares appear obstructed by polyps, Left significantly > Right, with mostly obstructed also clear congestion, oropharynx clear, MMM Neck -  supple, non-tender Heart - RRR, no murmurs heard Lungs - CTAB, no wheezing, crackles, or rhonchi. Normal work of breathing. Abd - soft, NTND, no rebound or guarding, no masses, +active BS MSK - back non-tender to palpation over spinous process or paraspinal muscles Ext - non-tender, no edema, peripheral pulses intact +2 b/l Neuro - awake, alert     Assessment:     See specific A&P problem list for details.      Plan:     See specific A&P problem list for details.

## 2013-11-15 NOTE — Patient Instructions (Signed)
Dear Thomas Kline, Thank you for coming in to clinic today. It was good to see you!  Today we discussed your Abdominal Bloating / Nasal Polyp. 1. For your abdominal bloating, I recommend stop taking Omeprazole, and starting Protonix 40mg  daily. Make sure you avoid trigger foods, also recommend if you can elevate the head of your bed. 2. I will consider a referral to a GI doctor in the future, if this is not improving. 3. For your Nasal Polyp, you will need to call the ENT office to schedule this appointment phone number to schedule appointment [phone]) - this may cost up to $200. Important to have this evaluated.  Some important numbers from today's visit: BP - 170/81 - make sure you take your BP med daily  Remember to schedule your colonoscopy for February 2016.  Please schedule a follow-up appointment with me (Dr. Parks Ranger) within 1 to 3 months for follow-up.  If you have any other questions or concerns, please feel free to call the clinic to contact me. You may also schedule an earlier appointment if necessary.  However, if your symptoms get significantly worse, please go to the Emergency Department to seek immediate medical attention.  [name], DO [place] When you have gastroesophageal reflux disease (GERD), the foods you eat and your eating habits are very important. Choosing the right foods can help ease the discomfort of GERD. WHAT GENERAL GUIDELINES DO I NEED TO FOLLOW?  Choose fruits, vegetables, whole grains, low-fat dairy products, and low-fat meat, fish, and poultry.  Limit fats such as oils, salad dressings, butter, nuts, and avocado.  Keep a food diary to identify foods that cause symptoms.  Avoid foods that cause reflux. These may be different for different people.  Eat frequent small meals instead of three large meals each day.  Eat your meals slowly, in a relaxed  setting.  Limit fried foods.  Cook foods using methods other than frying.  Avoid drinking alcohol.  Avoid drinking large amounts of liquids with your meals.  Avoid bending over or lying down until 2-3 hours after eating. WHAT FOODS ARE NOT RECOMMENDED? The following are some foods and drinks that may worsen your symptoms: Vegetables Tomatoes. Tomato juice. Tomato and spaghetti sauce. Chili peppers. Onion and garlic. Horseradish. Fruits Oranges, grapefruit, and lemon (fruit and juice). Meats High-fat meats, fish, and poultry. This includes hot dogs, ribs, ham, sausage, salami, and bacon. Dairy Whole milk and chocolate milk. Sour cream. Cream. Butter. Ice cream. Cream cheese.  Beverages Coffee and tea, with or without caffeine. Carbonated beverages or energy drinks. Condiments Hot sauce. Barbecue sauce.  Sweets/Desserts Chocolate and cocoa. Donuts. Peppermint and spearmint. Fats and Oils High-fat foods, including [place] fries and potato chips. Other Vinegar. Strong spices, such as black pepper, white pepper, red pepper, cayenne, curry powder, cloves, ginger, and chili powder. The items listed above may not be a complete list of foods and beverages to avoid. Contact your dietitian for more information. Document Released: 03/04/2005 Document Revised: 03/09/2013 Document Reviewed: 01/06/2013 [facility] Patient Information 2015 [place], [place]. This information is not intended to replace advice given to you by your health care provider. Make sure you discuss any questions you have with your health care provider.

## 2013-11-16 DIAGNOSIS — K5730 Diverticulosis of large intestine without perforation or abscess without bleeding: Secondary | ICD-10-CM | POA: Insufficient documentation

## 2013-11-16 NOTE — Assessment & Plan Note (Addendum)
Persistent nasal polyp (L>R), difficulty breathing on that side - Previous referral to ENT unsuccessful due to [facility]: 1. Provided pt with phone number for ENT office [phone], advised to call and schedule apt, may cost $200-300 2. If needed will send referral in future

## 2013-11-16 NOTE — Assessment & Plan Note (Signed)
Possibly related to uncontrolled GERD, also may be due to daily ferrous sulfate GI side-effects - Currently taking Ferrous sulfate 325mg  x1 daily  Plan: 1. Check CBC to see if still anemic, may be able to come off of iron supplement to see if GI side-effects improve 2. Hgb 12.4 (stable x 2 years with 12.5) 3. Will send pt letter with CBC results, and recommend discontinue daily ferrous sulfate for a trial to see if symptoms improve. May recommend MVI with iron as better tolerated option in future 4. Due for colonoscopy in 04/2014, may refer to GI in future for follow-up

## 2013-11-16 NOTE — Assessment & Plan Note (Signed)
Consistent with GERD  Plan: 1. Trial Protonix 40mg  daily, Discontinue Omeprazole 2. Avoid triggers, spicy foods / alcohol, given handout on dietary changes, advised elevate head of bed 3. Consider GI referral, may need EGD in future if persistent, worsening, or red flag symptoms 4. RTC 1-3 months

## 2013-11-16 NOTE — Assessment & Plan Note (Addendum)
Poorly controlled HTN - Question regular compliance with HCTZ No complications   Plan:  1. Continue HCTZ, emphasized importance of taking daily 2. Lifestyle Mods - Exercise, Dec salt intake, inc K+ rich vegs 3. Monitor BP at home or at drug store occasionally. 4. Recommend RTC 1-3 month to re-check BP, anticipate may need multiple drug regimen for HTN

## 2013-11-17 ENCOUNTER — Encounter: Payer: Self-pay | Admitting: Family Medicine

## 2014-01-06 ENCOUNTER — Ambulatory Visit (INDEPENDENT_AMBULATORY_CARE_PROVIDER_SITE_OTHER): Payer: PRIVATE HEALTH INSURANCE | Admitting: Family Medicine

## 2014-01-06 ENCOUNTER — Encounter: Payer: Self-pay | Admitting: Family Medicine

## 2014-01-06 VITALS — BP 144/101 | HR 65 | Temp 97.7°F | Ht 73.0 in | Wt 203.0 lb

## 2014-01-06 DIAGNOSIS — K219 Gastro-esophageal reflux disease without esophagitis: Secondary | ICD-10-CM

## 2014-01-06 DIAGNOSIS — R140 Abdominal distension (gaseous): Secondary | ICD-10-CM

## 2014-01-06 DIAGNOSIS — I10 Essential (primary) hypertension: Secondary | ICD-10-CM

## 2014-01-06 DIAGNOSIS — J339 Nasal polyp, unspecified: Secondary | ICD-10-CM

## 2014-01-06 NOTE — Assessment & Plan Note (Signed)
Improved HTN control. Initially elevated BP (mostly elevated DBP >100), re-checked at 833/38  No complications   Plan:  1. Continue HCTZ 25mg  daily 2. Monitor BP at next visit 3-6 months, consider adding 2nd agent if persistently elevated BP

## 2014-01-06 NOTE — Patient Instructions (Addendum)
Dear Thomas Kline, Thank you for coming in to clinic today.  1. For your abdominal bloating, it sounds like it is improving with the dietary changes. Continue Pantoprazole (Protonix) 40mg  daily (take around same time every morning with first meal of day). - Recommend starting Metamucil (daily fiber supplement) - over the counter, powder mix into water  2. For your Nasal Polyp, you will need to call the ENT office to schedule this appointment phone number to schedule appointment [phone]) (call when ready)  3. Blood pressure is improved today - Keep taking your medicine  Some important numbers from today's visit: BP - 140/77  Recommend to get Flu Shot this year!  Please schedule a follow-up appointment with me (Thomas Kline) within 6 to 12 months for follow-up.  If you have any other questions or concerns, please feel free to call the clinic to contact me. You may also schedule an earlier appointment if necessary.  However, if your symptoms get significantly worse, please go to the Emergency Department to seek immediate medical attention.  Thomas Putnam, DO [place] [facility] When you have gastroesophageal reflux disease (GERD), the foods you eat and your eating habits are very important. Choosing the right foods can help ease the discomfort of GERD. WHAT GENERAL GUIDELINES DO I NEED TO FOLLOW?  Choose fruits, vegetables, whole grains, low-fat dairy products, and low-fat meat, fish, and poultry.  Limit fats such as oils, salad dressings, butter, nuts, and avocado.  Keep a food diary to identify foods that cause symptoms.  Avoid foods that cause reflux. These may be different for different people.  Eat frequent small meals instead of three large meals each day.  Eat your meals slowly, in a relaxed setting.  Limit fried foods.  Cook foods using methods other than frying.  Avoid drinking  alcohol.  Avoid drinking large amounts of liquids with your meals.  Avoid bending over or lying down until 2-3 hours after eating. WHAT FOODS ARE NOT RECOMMENDED? The following are some foods and drinks that may worsen your symptoms: Vegetables Tomatoes. Tomato juice. Tomato and spaghetti sauce. Chili peppers. Onion and garlic. Horseradish. Fruits Oranges, grapefruit, and lemon (fruit and juice). Meats High-fat meats, fish, and poultry. This includes hot dogs, ribs, ham, sausage, salami, and bacon. Dairy Whole milk and chocolate milk. Sour cream. Cream. Butter. Ice cream. Cream cheese.  Beverages Coffee and tea, with or without caffeine. Carbonated beverages or energy drinks. Condiments Hot sauce. Barbecue sauce.  Sweets/Desserts Chocolate and cocoa. Donuts. Peppermint and spearmint. Fats and Oils High-fat foods, including [place] fries and potato chips. Other Vinegar. Strong spices, such as black pepper, white pepper, red pepper, cayenne, curry powder, cloves, ginger, and chili powder. The items listed above may not be a complete list of foods and beverages to avoid. Contact your dietitian for more information. Document Released: 03/04/2005 Document Revised: 03/09/2013 Document Reviewed: 01/06/2013 [facility] Patient Information 2015 [place], [place]. This information is not intended to replace advice given to you by your health care provider. Make sure you discuss any questions you have with your health care provider.

## 2014-01-06 NOTE — Assessment & Plan Note (Signed)
Some improvement following changes to treat GERD with dietary changes, decreased dairy, and switch Omeprazole to Protonix. Also discontinued Ferrous sulfate (likely GI side-effects) - No GI red flag symptoms  Plan: 1. Continue Protonix 40mg  daily (advised to take regularly similar time w/ first meal daily) 2. Recommend daily fiber supplement with Metamucil OTC 3. Continue dietary changes, reduce dairy 4. RTC PRN, consider further work-up or possible GI referral if significant worsening

## 2014-01-06 NOTE — Assessment & Plan Note (Signed)
Improved  Plan: 1. Continue Protonix 40mg  daily (improve regular schedule1st meal of day) 2. Continue dietary changes

## 2014-01-06 NOTE — Progress Notes (Signed)
Patient ID: Thomas Kline, male   DOB: [birth year], 63 y.o.   MRN: [id number] Subjective:    HPI  ABDOMINAL BLOATING: - Presents for follow-up for same complaint as last OV 11/15/13 - Reports some improvement with reduced abdominal bloating and gas, although this is still present. He states that he has worked on dietary changes as discussed, and found that reducing diary and some fruits (oranges) he has felt better. Additionally, has been compliant with Protonix 40mg  daily, however taking this daily but at random times of the day. Also, appropriately stopped taking daily Ferrous Sulfate 325mg  daily per my request < 1 month ago, after reviewing prior labs with stable Hgb, advised him to discontinue this due to concern about GI side-effects causing or exacerbating his symptoms. - Admits to mostly regular BMs, occasionally not every day due to some dietary choices - Denies fevers/chills, chest pain or tightness, SOB, abdominal pain, nausea, vomiting, diarrhea  Hx Nasal Polyps: - Significant history of Left > Right nasal polyp - States that he does not have the money to schedule this appointment with ENT currently, but is aware that he can schedule apt when ready - No new complaints today  CHRONIC HTN: Reports no concerns Current Meds - HCTZ 25mg  daily   Reports good compliance, took meds today. Tolerating well, w/o complaints. Denies HA, edema, dizziness / lightheadedness  HM: - Declines influenza vaccine today  I have reviewed and updated the following as appropriate: allergies and current medications  Social Hx: - Never smoker  Review of Systems  See above HPI    Objective:   Physical Exam  BP 144/101  Pulse 65  Temp(Src) 97.7 F (36.5 C) (Oral)  Ht 6\' 1"  (1.854 m)  Wt 203 lb (92.08 kg)  BMI 26.79 kg/m2  Re-check BP manually: 140/77  Gen - well-appearing, pleasant, NAD HEENT - NCAT, PERRL, EOMI, bilateral nares appear obstructed by polyps, Left significantly > Right, with  mostly obstructed also clear congestion, oropharynx clear, MMM Heart - RRR, no murmurs heard Abd - soft, NTND, no rebound or guarding, no masses, +active BS Ext - non-tender, no edema, peripheral pulses intact +2 b/l Neuro - awake, alert     Assessment:     See specific A&P problem list for details.      Plan:     See specific A&P problem list for details.

## 2014-01-06 NOTE — Assessment & Plan Note (Signed)
Stable persistent nasal polyps (L>R), unchanged  Plan: 1. Patient aware that [employer] is not covering ENT referral. Plan to call and schedule apt when can afford (>$200) 2. Will re-send referral in future if requested

## 2014-02-21 ENCOUNTER — Other Ambulatory Visit: Payer: Self-pay | Admitting: *Deleted

## 2014-02-21 MED ORDER — FLUTICASONE PROPIONATE 50 MCG/ACT NA SUSP: 2.0000 | Freq: Every day | NASAL | Status: DC

## 2014-03-08 ENCOUNTER — Ambulatory Visit: Payer: Self-pay | Admitting: Family Medicine

## 2014-03-21 ENCOUNTER — Ambulatory Visit (INDEPENDENT_AMBULATORY_CARE_PROVIDER_SITE_OTHER): Payer: Medicare Other | Admitting: Family Medicine

## 2014-03-21 ENCOUNTER — Encounter: Payer: Self-pay | Admitting: Family Medicine

## 2014-03-21 VITALS — BP 136/63 | HR 81 | Temp 98.0°F | Ht 73.0 in | Wt 202.8 lb

## 2014-03-21 DIAGNOSIS — K5730 Diverticulosis of large intestine without perforation or abscess without bleeding: Secondary | ICD-10-CM

## 2014-03-21 DIAGNOSIS — J339 Nasal polyp, unspecified: Secondary | ICD-10-CM

## 2014-03-21 NOTE — Progress Notes (Signed)
Patient ID: Thomas Kline, male   DOB: January 13, 1951, 64 y.o.   MRN: [id number] Subjective:    Patient presents for a same day appointment.  HPI  Chronic Nasal Polyps: - Patient with known hx chronic b/l nasal polyps since about 2012, Left significantly worse than Right, previously seen at [facility] for same complaint, and at that time due to [employer] ins would be required to pay out of pocket for ENT. - Today requests repeat ENT referral, recently had 54 yr birthday and has Medicare insurance, would like referral to pursue potential treatment options at ENT - Describes symptoms with significant mucus production worse in AM, and mostly blocked L>R, polyp since 2012  History of Lower GI Bleed / DIVERTICULOSIS: - Reports history of prior hospitalization in 04/2011 for lower GI bleed, had to have colonoscopy and received multiple units of blood transfusion - Today he states that he is due for repeat colonoscopy in February 2016 (3 years since last test), requesting information on how to schedule  I have reviewed and updated the following as appropriate: allergies and current medications  Social Hx: - Never smoker  Review of Systems  See above HPI    Objective:   Physical Exam  BP 136/63 mmHg  Pulse 81  Temp(Src) 98 F (36.7 C) (Oral)  Ht 6\' 1"  (1.854 m)  Wt 202 lb 12.8 oz (91.989 kg)  BMI 26.76 kg/m2  Gen - well-appearing, NAD HEENT - NCAT, bilateral maxillary sinuses non-tender, bilateral nares appear obstructed by polyps, Left significantly > Right, with mostly obstructed also clear congestion, oropharynx clear, MMM     Assessment:     See specific A&P problem list for details.      Plan:     See specific A&P problem list for details.

## 2014-03-21 NOTE — Assessment & Plan Note (Signed)
Stable, no symptoms or concerns today  Plan: 1. Referral to GI (Dr. Carol Ada, [facility]) for repeat colonoscopy (due 3-5 years)

## 2014-03-21 NOTE — Patient Instructions (Signed)
Dear Thomas Kline, Thank you for coming in to clinic today.  1. For your Nasal Polyp - ordered referral to ENT specialist (you will be notified with this appointment)  2. For your Colonoscopy follow-up - you were seen in February 2013 - recommended to return February 2016 - Dr. Carol Ada [facility]: Carol Ada D MD Internist Address: [address], [place], [place] [address] [phone]  Please schedule a follow-up appointment with me (Dr. Parks Ranger) within 6 to 12 months for follow-up.  If you have any other questions or concerns, please feel free to call the clinic to contact me. You may also schedule an earlier appointment if necessary.  However, if your symptoms get significantly worse, please go to the Emergency Department to seek immediate medical attention.  [name], [place]

## 2014-03-21 NOTE — Assessment & Plan Note (Signed)
Stable significant nasal polyps (L>R), with significant nasal obstruction (no significant worsening on interval exam)  Plan: 1. Referral to ENT for management - (now has medicare ins, covered as previously unable to go due to [employer])

## 2014-03-24 ENCOUNTER — Encounter: Payer: Self-pay | Admitting: *Deleted

## 2014-04-14 ENCOUNTER — Other Ambulatory Visit: Payer: Self-pay | Admitting: Family Medicine

## 2014-04-14 DIAGNOSIS — K219 Gastro-esophageal reflux disease without esophagitis: Secondary | ICD-10-CM

## 2014-08-01 ENCOUNTER — Other Ambulatory Visit: Payer: Self-pay | Admitting: Otolaryngology

## 2014-08-01 DIAGNOSIS — R519 Headache, unspecified: Secondary | ICD-10-CM

## 2014-08-01 DIAGNOSIS — R0981 Nasal congestion: Secondary | ICD-10-CM

## 2014-08-01 DIAGNOSIS — R51 Headache: Principal | ICD-10-CM

## 2014-08-12 ENCOUNTER — Other Ambulatory Visit: Payer: Self-pay | Admitting: Family Medicine

## 2014-08-12 ENCOUNTER — Other Ambulatory Visit: Payer: Medicare Other

## 2014-08-12 DIAGNOSIS — K219 Gastro-esophageal reflux disease without esophagitis: Secondary | ICD-10-CM

## 2014-09-09 ENCOUNTER — Encounter (HOSPITAL_COMMUNITY): Payer: Self-pay | Admitting: *Deleted

## 2014-09-09 ENCOUNTER — Emergency Department (HOSPITAL_COMMUNITY)
Admission: EM | Admit: 2014-09-09 | Discharge: 2014-09-10 | Disposition: A | Discharge status: Home / Self Care | Payer: Self-pay | Attending: Emergency Medicine | Admitting: Emergency Medicine

## 2014-09-09 DIAGNOSIS — F10120 Alcohol abuse with intoxication, uncomplicated: Secondary | ICD-10-CM | POA: Insufficient documentation

## 2014-09-09 DIAGNOSIS — Y92481 Parking lot as the place of occurrence of the external cause: Secondary | ICD-10-CM | POA: Insufficient documentation

## 2014-09-09 DIAGNOSIS — S0990XA Unspecified injury of head, initial encounter: Secondary | ICD-10-CM | POA: Insufficient documentation

## 2014-09-09 DIAGNOSIS — F10920 Alcohol use, unspecified with intoxication, uncomplicated: Secondary | ICD-10-CM

## 2014-09-09 DIAGNOSIS — Y998 Other external cause status: Secondary | ICD-10-CM | POA: Insufficient documentation

## 2014-09-09 DIAGNOSIS — Z7951 Long term (current) use of inhaled steroids: Secondary | ICD-10-CM | POA: Insufficient documentation

## 2014-09-09 DIAGNOSIS — Y9389 Activity, other specified: Secondary | ICD-10-CM | POA: Insufficient documentation

## 2014-09-09 DIAGNOSIS — Z8719 Personal history of other diseases of the digestive system: Secondary | ICD-10-CM | POA: Insufficient documentation

## 2014-09-09 DIAGNOSIS — Z79899 Other long term (current) drug therapy: Secondary | ICD-10-CM | POA: Insufficient documentation

## 2014-09-09 DIAGNOSIS — W1839XA Other fall on same level, initial encounter: Secondary | ICD-10-CM | POA: Insufficient documentation

## 2014-09-09 NOTE — ED Provider Notes (Signed)
CSN: [id number]     Arrival date & time 09/09/14  2313 History  This chart was scribed for Jola Schmidt, MD by Sima Matas, ED Scribe. This patient was seen in room D33C/D33C and the patient's care was started at 11:42 PM.    Chief Complaint  Patient presents with  . Alcohol Intoxication   Level V caveat: Uncooperative/intoxicated   HPI  HPI Comments: Thomas Kline is a 64 y.o. male who presents to the Emergency Department brought to the emergency department after found in his parking lot combative.  He had evidence of minor head trauma with swelling to the left parietal scalp.  He also admits to alcohol.  Patient was noted to be emanating around the emergency department yelling at staff.  Past Medical History  Diagnosis Date  . Seizures   . Pancolonic diverticulosis 05/06/11    diagnosed on colonoscopy from 04/2011 along with multiple polyps and internal and external hemorrhoids.   . Intestinal polyps     colonoscopy from 05/06/11: multiple polyps and internal and external hemorrhoids. along with pan diverticulosis   Past Surgical History  Procedure Laterality Date  . Colonoscopy  05/06/2011    Procedure: COLONOSCOPY;  Surgeon: Beryle Beams, MD;  Location: [facility] ENDOSCOPY;  Service: Endoscopy;  Laterality: N/A;   History reviewed. No pertinent family history. History  Substance Use Topics  . Smoking status: Never Smoker   . Smokeless tobacco: Not on file  . Alcohol Use: 0.6 oz/week    1 Cans of beer per week   Review of Systems  Unable to perform ROS   A complete 10 system review of systems was obtained and all systems are negative except as noted in the HPI and PMH.    Allergies  Review of patient's allergies indicates no known allergies.  Home Medications   Prior to Admission medications   Medication Sig Start Date End Date Taking? Authorizing Provider  fluticasone (FLONASE) 50 MCG/ACT nasal spray Place 2 sprays into both nostrils daily. 02/21/14   Mariel Aloe, MD   hydrochlorothiazide (HYDRODIURIL) 25 MG tablet Take 1 tablet (25 mg total) by mouth daily. 11/15/13   Olin Hauser, DO  pantoprazole (PROTONIX) 40 MG tablet TAKE 1 TABLET BY MOUTH EVERY DAY 08/16/14   Olin Hauser, DO   Triage Vitals: BP 157/84 mmHg  Pulse 102  Temp(Src) 98 F (36.7 C) (Oral)  Resp 20  SpO2 97%  Physical Exam  Constitutional: He appears well-developed and well-nourished.  HENT:  Head: Normocephalic and atraumatic.  Eyes: EOM are normal.  Neck: Normal range of motion.  Cardiovascular: Normal rate, regular rhythm, normal heart sounds and intact distal pulses.   Pulmonary/Chest: Effort normal and breath sounds normal. No respiratory distress.  Abdominal: Soft. He exhibits no distension. There is no tenderness.  Musculoskeletal: Normal range of motion.  Moves all 4 extremities equally.  Ambulatory.  Ataxic gait  Neurological: He is alert.  Oriented to person and place but not time  Skin: Skin is warm and dry.  Psychiatric: He has a normal mood and affect. Judgment normal.  Nursing note and vitals reviewed.   ED Course  Procedures (including critical care time)  DIAGNOSTIC STUDIES: Oxygen Saturation is 97% on RA, normal by my interpretation.    COORDINATION OF CARE: 11:41 PM- Discussed plans to Pt advised of plan for treatment and pt agrees.  Labs Review Labs Reviewed  CBC WITH DIFFERENTIAL/PLATELET - Abnormal; Notable for the following:    HCT 38.9 (*)  All other components within normal limits  BASIC METABOLIC PANEL - Abnormal; Notable for the following:    Potassium 3.2 (*)    CO2 20 (*)    Glucose, Bld 148 (*)    Calcium 8.8 (*)    All other components within normal limits  ETHANOL - Abnormal; Notable for the following:    Alcohol, Ethyl (B) 286 (*)    All other components within normal limits    Imaging Review Ct Head Wo Contrast  09/10/2014   CLINICAL DATA:  Status post fall, with left scalp hematoma. Initial encounter.   EXAM: CT HEAD WITHOUT CONTRAST  TECHNIQUE: Contiguous axial images were obtained from the base of the skull through the vertex without intravenous contrast.  COMPARISON:  CT of the head performed 09/04/2010, and MRI of the brain performed 05/07/2011  FINDINGS: There is no evidence of acute infarction, mass lesion, or intra- or extra-axial hemorrhage on CT.  Prominence of the ventricles and sulci suggests mild cortical volume loss. Mild periventricular and subcortical white matter change likely reflects small vessel ischemic microangiopathy.  The brainstem and fourth ventricle are within normal limits. The third and lateral ventricles, and basal ganglia are unremarkable in appearance. The cerebral hemispheres demonstrate grossly normal gray-white differentiation. No mass effect or midline shift is seen.  There is no evidence of fracture; visualized osseous structures are unremarkable in appearance. The visualized portions of the orbits are within normal limits. There is chronic near-complete opacification of the nasal passages, ethmoid air cells and frontal sinuses, with fluid layering at the maxillary sinuses bilaterally. The opacification is somewhat polypoid in nature. As this is stable from prior studies, it likely reflects a large amount of inspissated mucus. The mastoid air cells are well-aerated. Mild soft tissue swelling is noted overlying the high left parietal calvarium.  IMPRESSION: 1. No evidence of traumatic intracranial injury or fracture. 2. Mild soft tissue swelling overlying the high left parietal calvarium. 3. Mild cortical volume loss and scattered small vessel ischemic microangiopathy. 4. Chronic near complete opacification of the nasal passages, ethmoid air cells and frontal sinuses, with fluid layering at the maxillary sinuses bilaterally. The opacification is somewhat polypoid in nature. As this is stable from 2012, it likely reflects a large amount of inspissated mucus.   Electronically Signed    By: Garald Balding M.D.   On: 09/10/2014 02:26  I personally reviewed the imaging tests through PACS system I reviewed available ER/hospitalization records through the EMR    EKG Interpretation None     MDM   Final diagnoses:  None    7:15 AM Patient is ambulatory in the emergency department.  Clinically sober at this time.  Gait is normalized.  Discharge home in good condition.  CT head negative  I personally performed the services described in this documentation, which was scribed in my presence. The recorded information has been reviewed and is accurate.     Jola Schmidt, MD 09/10/14 [phone]

## 2014-09-09 NOTE — ED Notes (Signed)
Pt in via EMS, pt was found in the parking lot of his apartment complex by [employer], pt was combative with [employer] and they were unsure if he fell, pt has hematoma to left side of head, not tender to palpation, unknown if this is new, pt arrived in c-collar that he ripped off on arrival, yelling, moving all extremities, at times threatening staff, speech slurred, does not follow commands, pt admits to drinking one beer.

## 2014-09-10 ENCOUNTER — Emergency Department (HOSPITAL_COMMUNITY): Payer: Self-pay

## 2014-09-10 LAB — CBC WITH DIFFERENTIAL/PLATELET
BASOS ABS: 0 10*3/uL (ref 0.0–0.1)
Basophils Relative: 0 % (ref 0–1)
EOS ABS: 0 10*3/uL (ref 0.0–0.7)
Eosinophils Relative: 0 % (ref 0–5)
HCT: 38.9 % — ABNORMAL LOW (ref 39.0–52.0)
Hemoglobin: 13 g/dL (ref 13.0–17.0)
Lymphocytes Relative: 24 % (ref 12–46)
Lymphs Abs: 2 10*3/uL (ref 0.7–4.0)
MCH: 29.4 pg (ref 26.0–34.0)
MCHC: 33.4 g/dL (ref 30.0–36.0)
MCV: 88 fL (ref 78.0–100.0)
MONO ABS: 0.4 10*3/uL (ref 0.1–1.0)
Monocytes Relative: 4 % (ref 3–12)
NEUTROS PCT: 72 % (ref 43–77)
Neutro Abs: 6.1 10*3/uL (ref 1.7–7.7)
Platelets: 354 10*3/uL (ref 150–400)
RBC: 4.42 MIL/uL (ref 4.22–5.81)
RDW: 13.4 % (ref 11.5–15.5)
WBC: 8.5 10*3/uL (ref 4.0–10.5)

## 2014-09-10 LAB — BASIC METABOLIC PANEL
Anion gap: 14 (ref 5–15)
BUN: 12 mg/dL (ref 6–20)
CALCIUM: 8.8 mg/dL — AB (ref 8.9–10.3)
CHLORIDE: 108 mmol/L (ref 101–111)
CO2: 20 mmol/L — ABNORMAL LOW (ref 22–32)
CREATININE: 0.91 mg/dL (ref 0.61–1.24)
GFR calc Af Amer: >60 mL/min (ref >60)
GFR calc non Af Amer: >60 mL/min (ref >60)
GLUCOSE: 148 mg/dL — AB (ref 65–99)
Potassium: 3.2 mmol/L — ABNORMAL LOW (ref 3.5–5.1)
Sodium: 142 mmol/L (ref 135–145)

## 2014-09-10 LAB — ETHANOL: Alcohol, Ethyl (B): 286 mg/dL — ABNORMAL HIGH (ref <5)

## 2014-09-10 MED ORDER — ZIPRASIDONE MESYLATE 20 MG IM SOLR
20.0000 mg | Freq: Once | INTRAMUSCULAR | Status: AC
  Administered 2014-09-10: 20 mg via INTRAMUSCULAR

## 2014-09-10 NOTE — Discharge Instructions (Signed)
Alcohol Intoxication  Alcohol intoxication occurs when the amount of alcohol that a person has consumed impairs his or her ability to mentally and physically function. Alcohol directly impairs the normal chemical activity of the brain. Drinking large amounts of alcohol can lead to changes in mental function and behavior, and it can cause many physical effects that can be harmful.   Alcohol intoxication can range in severity from mild to very severe. Various factors can affect the level of intoxication that occurs, such as the person's age, gender, weight, frequency of alcohol consumption, and the presence of other medical conditions (such as diabetes, seizures, or heart conditions). Dangerous levels of alcohol intoxication may occur when people drink large amounts of alcohol in a short period (binge drinking). Alcohol can also be especially dangerous when combined with certain prescription medicines or "recreational" drugs.  SIGNS AND SYMPTOMS  Some common signs and symptoms of mild alcohol intoxication include:  · Loss of coordination.  · Changes in mood and behavior.  · Impaired judgment.  · Slurred speech.  As alcohol intoxication progresses to more severe levels, other signs and symptoms will appear. These may include:  · Vomiting.  · Confusion and impaired memory.  · Slowed breathing.  · Seizures.  · Loss of consciousness.  DIAGNOSIS   Your health care provider will take a medical history and perform a physical exam. You will be asked about the amount and type of alcohol you have consumed. Blood tests will be done to measure the concentration of alcohol in your blood. In many places, your blood alcohol level must be lower than 80 mg/dL (0.08%) to legally drive. However, many dangerous effects of alcohol can occur at much lower levels.   TREATMENT   People with alcohol intoxication often do not require treatment. Most of the effects of alcohol intoxication are temporary, and they go away as the alcohol naturally  leaves the body. Your health care provider will monitor your condition until you are stable enough to go home. Fluids are sometimes given through an IV access tube to help prevent dehydration.   HOME CARE INSTRUCTIONS  · Do not drive after drinking alcohol.  · Stay hydrated. Drink enough water and fluids to keep your urine clear or pale yellow. Avoid caffeine.    · Only take over-the-counter or prescription medicines as directed by your health care provider.    SEEK MEDICAL CARE IF:   · You have persistent vomiting.    · You do not feel better after a few days.  · You have frequent alcohol intoxication. Your health care provider can help determine if you should see a substance use treatment counselor.  SEEK IMMEDIATE MEDICAL CARE IF:   · You become shaky or tremble when you try to stop drinking.    · You shake uncontrollably (seizure).    · You throw up (vomit) blood. This may be bright red or may look like black coffee grounds.    · You have blood in your stool. This may be bright red or may appear as a black, tarry, bad smelling stool.    · You become lightheaded or faint.    MAKE SURE YOU:   · Understand these instructions.  · Will watch your condition.  · Will get help right away if you are not doing well or get worse.  Document Released: 12/12/2004 Document Revised: 11/04/2012 Document Reviewed: 08/07/2012  ExitCare® Patient Information ©2015 [employer]. This information is not intended to replace advice given to you by your health care provider. Make sure   you discuss any questions you have with your health care provider.

## 2014-09-10 NOTE — ED Notes (Signed)
Sitter at bedside due to patient safety risk

## 2014-09-10 NOTE — ED Notes (Signed)
Pt sleeping at this time with blankets pulled over head

## 2014-10-07 ENCOUNTER — Ambulatory Visit (INDEPENDENT_AMBULATORY_CARE_PROVIDER_SITE_OTHER): Payer: Medicare Other | Admitting: Family Medicine

## 2014-10-07 ENCOUNTER — Encounter: Payer: Self-pay | Admitting: Family Medicine

## 2014-10-07 VITALS — BP 156/74 | HR 98 | Temp 98.4°F | Ht 70.0 in | Wt 200.0 lb

## 2014-10-07 DIAGNOSIS — J339 Nasal polyp, unspecified: Secondary | ICD-10-CM

## 2014-10-07 NOTE — Patient Instructions (Signed)
Dear Thomas Kline, Thank you for coming in to clinic today. It was good to see you!  1. Your nasal polyps are unchanged. 2. We will talk to ENT office to see what next step is 3. Please review your financial / insurance concerns with Pamala Hurry today to see if we can help you get Medicare Part B coverage  Please schedule a follow-up appointment with Dr. Parks Ranger within 3 months as needed  If you have any other questions or concerns, please feel free to call the clinic to contact me. You may also schedule an earlier appointment if necessary.  However, if your symptoms get significantly worse, please go to the Emergency Department to seek immediate medical attention.  [name], [place]

## 2014-10-07 NOTE — Assessment & Plan Note (Signed)
Stable significant nasal polyps (L>R), with significant nasal obstruction but no interval worsening compared to 03/21/14. - Referred to ENT - Dr. Lucia Gaskins, has had contact and given Prednisone dosepak taper 08/2014, however unclear if improved, and unable to follow due to no Medicare Part B ins  Plan: 1. Reviewed Medicare Paperwork, discussed with Eusebio Friendly, she contacted Medicare, confirmed pt does not have Part B. She needs more info about his income, will continue to investigate, and patient transferred to her office to complete her visit with him. 2. Will contact Dr. Pollie Friar office to inquire about prednisone and current plan, if future surgery is option if he can get ins coverage 3. Follow-up PRN

## 2014-10-07 NOTE — Progress Notes (Signed)
   Subjective:    Patient ID: Thomas Kline, male    DOB: [birth year], 64 y.o.   MRN: [id number]  HPI  Chronic Nasal Polyps: - Patient with known hx chronic b/l nasal polyps since about 2012, Left significantly worse than Right, Last seen by me 03/21/14 for this complaint, referral to ENT placed, he understood he would have to pay out of pocket at that time, had orange card, medicare part A only - Today he follows up bringing multiple Medicare papers, and is confused about not having Part B. States he attempted to follow-up (unclear if he actually saw Dr. Lucia Gaskins, ENT) about his nasal polyps, but was told they could not follow him because he only has Part A (hospital only coverage), he was given a "pill" does not know which one (however this is most likely prednisone, dose pak taper per chart review, med rec). He is unsure if future plans include surgery. - Needs help understanding Medicare forms and if he has part B or not. No other complaints or symptoms today - Reports nasal polyps are stable without change, no worsening, still breathing same - Denies any HA, epistaxis, purulent discharge, sinus pain, fevers/chills  Additionally, note patient recently visited ED for acute alcohol intoxication. No further questions and did not want to discuss this today.  I have reviewed and updated the following as appropriate: allergies and current medications  Social Hx: - Never smoker - Drinks alcohol, denies daily consumption  Review of Systems  See above HPI    Objective:   Physical Exam  BP 156/74 mmHg  Pulse 98  Temp(Src) 98.4 F (36.9 C) (Oral)  Ht 5\' 10"  (1.778 m)  Wt 200 lb (90.719 kg)  BMI 28.70 kg/m2  Gen - well-appearing, pleasant, NAD HEENT - NCAT sinuses non-tender, PERRL, EOMI, bilateral nares appear obstructed by polyps, Left significantly > Right, with mostly obstructed also clear congestion, oropharynx clear, MMM      Assessment & Plan:   See specific A&P problem list for  details.

## 2014-12-20 ENCOUNTER — Other Ambulatory Visit: Payer: Self-pay | Admitting: Family Medicine

## 2014-12-20 DIAGNOSIS — K219 Gastro-esophageal reflux disease without esophagitis: Secondary | ICD-10-CM

## 2015-03-15 ENCOUNTER — Encounter (HOSPITAL_COMMUNITY): Payer: Self-pay | Admitting: *Deleted

## 2015-03-15 ENCOUNTER — Emergency Department (HOSPITAL_COMMUNITY)
Admission: EM | Admit: 2015-03-15 | Discharge: 2015-03-15 | Discharge status: Against Medical Advice | Payer: Medicare Other | Attending: Emergency Medicine | Admitting: Emergency Medicine

## 2015-03-15 DIAGNOSIS — R0981 Nasal congestion: Secondary | ICD-10-CM | POA: Insufficient documentation

## 2015-03-15 DIAGNOSIS — R51 Headache: Secondary | ICD-10-CM | POA: Insufficient documentation

## 2015-03-15 NOTE — ED Notes (Signed)
No answer for clean and ready room x3

## 2015-03-15 NOTE — ED Notes (Signed)
Pt from home for eval of nasal congestion intermittently x2 years, states was seen and told had nasal polys that were obstructing nasal space. Airway intact at this time, pt also states headache from nasal congestion. nad noted.

## 2015-03-16 ENCOUNTER — Encounter (HOSPITAL_COMMUNITY): Payer: Self-pay | Admitting: Cardiology

## 2015-03-16 ENCOUNTER — Emergency Department (HOSPITAL_COMMUNITY)
Admission: EM | Admit: 2015-03-16 | Discharge: 2015-03-16 | Discharge status: Against Medical Advice | Payer: Medicare Other | Attending: Emergency Medicine | Admitting: Emergency Medicine

## 2015-03-16 DIAGNOSIS — R51 Headache: Secondary | ICD-10-CM | POA: Insufficient documentation

## 2015-03-16 DIAGNOSIS — J339 Nasal polyp, unspecified: Secondary | ICD-10-CM | POA: Insufficient documentation

## 2015-03-16 NOTE — ED Notes (Signed)
Pt called for room x3 with no answer. 

## 2015-03-16 NOTE — ED Notes (Signed)
Called for room x 2.  

## 2015-03-16 NOTE — ED Notes (Signed)
Pt reports he has a nasal polyp on the left side since 2012. Reports he tried to go to the ENT but that copay was to high. Also reports having a headache.

## 2015-03-27 ENCOUNTER — Emergency Department (HOSPITAL_COMMUNITY)
Admission: EM | Admit: 2015-03-27 | Discharge: 2015-03-27 | Disposition: A | Discharge status: Home / Self Care | Payer: Medicare Other | Attending: Emergency Medicine | Admitting: Emergency Medicine

## 2015-03-27 ENCOUNTER — Encounter (HOSPITAL_COMMUNITY): Payer: Self-pay | Admitting: Nurse Practitioner

## 2015-03-27 DIAGNOSIS — Z79899 Other long term (current) drug therapy: Secondary | ICD-10-CM | POA: Insufficient documentation

## 2015-03-27 DIAGNOSIS — J339 Nasal polyp, unspecified: Secondary | ICD-10-CM

## 2015-03-27 DIAGNOSIS — R51 Headache: Secondary | ICD-10-CM | POA: Insufficient documentation

## 2015-03-27 DIAGNOSIS — Z8719 Personal history of other diseases of the digestive system: Secondary | ICD-10-CM | POA: Insufficient documentation

## 2015-03-27 DIAGNOSIS — Z7951 Long term (current) use of inhaled steroids: Secondary | ICD-10-CM | POA: Insufficient documentation

## 2015-03-27 MED ORDER — PREDNISONE 20 MG PO TABS: 40.0000 mg | ORAL_TABLET | Freq: Every day | ORAL | Status: DC

## 2015-03-27 NOTE — ED Provider Notes (Signed)
CSN: [id number]     Arrival date & time 03/27/15  1347 History  By signing my name below, I, Eustaquio Maize, attest that this documentation has been prepared under the direction and in the presence of Harlene Ramus, PA-C. Electronically Signed: Eustaquio Maize, ED Scribe. 03/27/2015. 3:30 PM.   Chief Complaint  Patient presents with  . Nasal Polyps   The history is provided by the patient. No language interpreter was used.     HPI Comments: Thomas Kline is a 65 y.o. male who presents to the Emergency Department complaining of nasal polyps to bilateral naris x 2 years, gradually worsening. Pt has been seen in the ED for this in the past and was told to follow up with ENT. He mentions following up with ENT this past summer in July (approximately 6 months ago). ENT advised pt to have surgery to remove polyps but pt has not scheduled surgery for it because his medicaid will not cover it. He returns to the ED today for worsening difficulty breathing through his nose from the nasal polyps. He also complains of nasal congestion, rhinorrhea, intermittent epistaxis with blood clots, and a mild headache which he states is typical from the nasal polyps. Denies fever, sinus pressure, shortness of breath, chest pain, lightheadedness, dizziness, syncope, neck stiffness or any other associated symptoms.   Past Medical History  Diagnosis Date  . Seizures ([place])   . Pancolonic diverticulosis 05/06/11    diagnosed on colonoscopy from 04/2011 along with multiple polyps and internal and external hemorrhoids.   . Intestinal polyps     colonoscopy from 05/06/11: multiple polyps and internal and external hemorrhoids. along with pan diverticulosis   Past Surgical History  Procedure Laterality Date  . Colonoscopy  05/06/2011    Procedure: COLONOSCOPY;  Surgeon: Beryle Beams, MD;  Location: [facility] ENDOSCOPY;  Service: Endoscopy;  Laterality: N/A;   History reviewed. No pertinent family history. Social History  Substance  Use Topics  . Smoking status: Never Smoker   . Smokeless tobacco: None  . Alcohol Use: 0.6 oz/week    1 Cans of beer per week    Review of Systems  Constitutional: Negative for fever.  HENT: Positive for congestion, nosebleeds and rhinorrhea. Negative for sinus pressure.        + Difficulty breathing through nose  Respiratory: Negative for shortness of breath.   Neurological: Positive for headaches.  All other systems reviewed and are negative.   Allergies  Review of patient's allergies indicates no known allergies.  Home Medications   Prior to Admission medications   Medication Sig Start Date End Date Taking? Authorizing Provider  fluticasone (FLONASE) 50 MCG/ACT nasal spray Place 2 sprays into both nostrils daily. 02/21/14   Mariel Aloe, MD  hydrochlorothiazide (HYDRODIURIL) 25 MG tablet Take 1 tablet (25 mg total) by mouth daily. 11/15/13   Olin Hauser, DO  pantoprazole (PROTONIX) 40 MG tablet TAKE 1 TABLET BY MOUTH EVERY DAY 12/21/14   Olin Hauser, DO  predniSONE (DELTASONE) 20 MG tablet Take 2 tablets (40 mg total) by mouth daily. Take 40 mg by mouth daily for 3 days, then 20mg  by mouth daily for 3 days, then 10mg  daily for 3 days 03/27/15   Nona Dell, PA-C   Triage Vitals:  BP 185/102 mmHg  Pulse 87  Temp(Src) 98.1 F (36.7 C) (Oral)  Resp 16  Ht 6\' 1"  (1.854 m)  Wt 201 lb 4 oz (91.286 kg)  BMI 26.56 kg/m2  SpO2 97%  Physical Exam  Constitutional: He is oriented to person, place, and time. He appears well-developed and well-nourished. No distress.  HENT:  Head: Normocephalic and atraumatic.  Nose: Rhinorrhea present. No epistaxis. Right sinus exhibits no maxillary sinus tenderness and no frontal sinus tenderness. Left sinus exhibits no maxillary sinus tenderness and no frontal sinus tenderness.  Mouth/Throat: Uvula is midline, oropharynx is clear and moist and mucous membranes are normal. No oropharyngeal exudate.  Bilateral  nares appear obstructed by polyps, left significantly worse than right with clear congestion present  Eyes: Conjunctivae and EOM are normal. Pupils are equal, round, and reactive to light.  Neck: Normal range of motion. Neck supple. No tracheal deviation present.  Cardiovascular: Normal rate, regular rhythm, normal heart sounds and intact distal pulses.   Pulmonary/Chest: Effort normal and breath sounds normal. No stridor. No respiratory distress. He has no wheezes. He has no rales. He exhibits no tenderness.  Abdominal: Soft. Bowel sounds are normal. He exhibits no distension. There is no tenderness.  Musculoskeletal: Normal range of motion.  Lymphadenopathy:    He has no cervical adenopathy.  Neurological: He is alert and oriented to person, place, and time. No cranial nerve deficit.  Skin: Skin is warm and dry.  Psychiatric: He has a normal mood and affect. His behavior is normal.  Nursing note and vitals reviewed.   ED Course  Procedures (including critical care time)  DIAGNOSTIC STUDIES: Oxygen Saturation is 97% on RA, normal by my interpretation.    COORDINATION OF CARE: 3:05 PM-Discussed treatment plan which includes consultation to case management with pt at bedside and pt agreed to plan.   Labs Review Labs Reviewed - No data to display  Imaging Review No results found.  Filed Vitals:   03/27/15 1426 03/27/15 1552  BP: 185/102 180/90  Pulse: 87 80  Temp: 98.1 F (36.7 C) 98.1 F (36.7 C)  Resp: 16 16     MDM   Final diagnoses:  Nasal polyps   Patient presents with complaint of nasal polyps. He endorses having chronic worsening nasal polyps since 2012 and has followed up with ENT but has not had surgery at due to being unable to afford surgery. VSS. Exam revealed bilateral nares obstructed with nasal polyps present, left worse than right, no epistaxis present. I consult to case management regarding patient's follow-up with ENT. Therefore the patient has Medicare  coverage however he needs to find an ENT practice double take his Medicare insurance to help cover for his surgery. Discussed this with patient. Plan to discharge patient home with short prednisone taper.  Evaluation does not show pathology requring ongoing emergent intervention or admission. Pt is hemodynamically stable and mentating appropriately. Discussed findings/results and plan with patient/guardian, who agrees with plan. All questions answered. Return precautions discussed and outpatient follow up given.    I personally performed the services described in this documentation, which was scribed in my presence. The recorded information has been reviewed and is accurate.      Chesley Noon [place], [place] 03/27/15 2247  Carmin Muskrat, MD 03/28/15 [phone]

## 2015-03-27 NOTE — Discharge Instructions (Signed)
Take your medication as prescribed. Follow-up with an ENT office for further management of your chronic nasal polyps. Return to the emergency department if symptoms worsen or new onset of fever, nosebleed, difficulty breathing, chest pain, lightheadedness, dizziness, syncope.

## 2015-03-27 NOTE — ED Notes (Addendum)
He states he has nasal polyps and they are getting larger and blocking him from breathing through his nose now. Nasal swelling noted, polyps are easily visualized larger in L nostril. He has no difficulty breathing through his mouth and is able to speak in full sentences. He was treated by ENT for this in past.

## 2015-06-01 ENCOUNTER — Emergency Department (HOSPITAL_COMMUNITY)
Admission: EM | Admit: 2015-06-01 | Discharge: 2015-06-02 | Disposition: A | Discharge status: Home / Self Care | Payer: Medicare Other | Attending: Emergency Medicine | Admitting: Emergency Medicine

## 2015-06-01 ENCOUNTER — Encounter (HOSPITAL_COMMUNITY): Payer: Self-pay | Admitting: Nurse Practitioner

## 2015-06-01 DIAGNOSIS — R1110 Vomiting, unspecified: Secondary | ICD-10-CM | POA: Insufficient documentation

## 2015-06-01 DIAGNOSIS — J339 Nasal polyp, unspecified: Secondary | ICD-10-CM | POA: Insufficient documentation

## 2015-06-01 LAB — COMPREHENSIVE METABOLIC PANEL
ALBUMIN: 4 g/dL (ref 3.5–5.0)
ALK PHOS: 94 U/L (ref 38–126)
ALT: 20 U/L (ref 17–63)
ANION GAP: 13 (ref 5–15)
AST: 20 U/L (ref 15–41)
BILIRUBIN TOTAL: 0.4 mg/dL (ref 0.3–1.2)
BUN: 10 mg/dL (ref 6–20)
CALCIUM: 9.5 mg/dL (ref 8.9–10.3)
CO2: 24 mmol/L (ref 22–32)
Chloride: 104 mmol/L (ref 101–111)
Creatinine, Ser: 0.96 mg/dL (ref 0.61–1.24)
GFR calc non Af Amer: >60 mL/min (ref >60)
GLUCOSE: 147 mg/dL — AB (ref 65–99)
POTASSIUM: 4.3 mmol/L (ref 3.5–5.1)
Sodium: 141 mmol/L (ref 135–145)
TOTAL PROTEIN: 7.6 g/dL (ref 6.5–8.1)

## 2015-06-01 LAB — CBC
HEMATOCRIT: 39 % (ref 39.0–52.0)
HEMOGLOBIN: 12.7 g/dL — AB (ref 13.0–17.0)
MCH: 29 pg (ref 26.0–34.0)
MCHC: 32.6 g/dL (ref 30.0–36.0)
MCV: 89 fL (ref 78.0–100.0)
Platelets: 275 10*3/uL (ref 150–400)
RBC: 4.38 MIL/uL (ref 4.22–5.81)
RDW: 13.6 % (ref 11.5–15.5)
WBC: 6.7 10*3/uL (ref 4.0–10.5)

## 2015-06-01 NOTE — ED Notes (Signed)
Called x 2 for patient.   No answer.

## 2015-06-01 NOTE — ED Notes (Addendum)
He c/o 1 week history of chills and nausea, constant since onset.  He denies any pain, bowel/bladder changes.  He reports he would aslo like his nasal polyps re-evaluated, he was told to see Dr Benjamine Mola but the pt states dr Benjamine Mola referred him back to ED.

## 2015-06-02 ENCOUNTER — Encounter (HOSPITAL_COMMUNITY): Payer: Self-pay | Admitting: Nurse Practitioner

## 2015-06-02 ENCOUNTER — Emergency Department (HOSPITAL_COMMUNITY)
Admission: EM | Admit: 2015-06-02 | Discharge: 2015-06-02 | Disposition: A | Discharge status: Home / Self Care | Payer: Medicare Other | Source: Home / Self Care | Attending: Emergency Medicine | Admitting: Emergency Medicine

## 2015-06-02 DIAGNOSIS — J339 Nasal polyp, unspecified: Secondary | ICD-10-CM

## 2015-06-02 MED ORDER — PREDNISONE 20 MG PO TABS: 40.0000 mg | ORAL_TABLET | Freq: Every day | ORAL | Status: DC

## 2015-06-02 NOTE — ED Notes (Signed)
   PT states he was here yesterday for a 1 week history of chills and nausea as well as evaluation of nasal polyps and left due to wait time. hes had a history of nasal polyps and was referred to dr Benjamine Mola but the pt states he called teohs office and they told him to come back to ED.

## 2015-06-02 NOTE — Discharge Instructions (Signed)
Continue current medication.   Schedule to see Dr. Franchot Gallo for ealuation

## 2015-06-02 NOTE — ED Provider Notes (Signed)
CSN: [id number]     Arrival date & time 06/02/15  1513 History  By signing my name below, I, Rayna Sexton, attest that this documentation has been prepared under the direction and in the presence of Caryl Ada, [place]. Electronically Signed: Rayna Sexton, ED Scribe. 06/02/2015. 5:18 PM.   Chief Complaint  Patient presents with  . Abdominal Pain  . Nose Problem   The history is provided by the patient. No language interpreter was used.   HPI Comments: Thomas Kline is a 65 y.o. male who presents to the Emergency Department complaining of an exacerbation of his nasal polyps onset in the past few months. He has been seen for the same symptoms and was referred to Dr. Benjamine Mola who he states told him to come back to the ED for reevaluation. He states that his polyps have been removed in the past. He notes taking Atrovent and Flonase in the past without significant relief but does confirm taking rx steroids with mild relief. He denies a PMHx including DM. Pt denies any other associated symptoms at this time.   Past Medical History  Diagnosis Date  . Seizures ([place])   . Pancolonic diverticulosis 05/06/11    diagnosed on colonoscopy from 04/2011 along with multiple polyps and internal and external hemorrhoids.   . Intestinal polyps     colonoscopy from 05/06/11: multiple polyps and internal and external hemorrhoids. along with pan diverticulosis   Past Surgical History  Procedure Laterality Date  . Colonoscopy  05/06/2011    Procedure: COLONOSCOPY;  Surgeon: Beryle Beams, MD;  Location: [facility] ENDOSCOPY;  Service: Endoscopy;  Laterality: N/A;   History reviewed. No pertinent family history. Social History  Substance Use Topics  . Smoking status: Never Smoker   . Smokeless tobacco: None  . Alcohol Use: 0.6 oz/week    1 Cans of beer per week    Review of Systems  A complete 10 system review of systems was obtained and all systems are negative except as noted in the HPI and PMH.   Allergies   Review of patient's allergies indicates no known allergies.  Home Medications   Prior to Admission medications   Medication Sig Start Date End Date Taking? Authorizing Provider  fluticasone (FLONASE) 50 MCG/ACT nasal spray Place 2 sprays into both nostrils daily. 02/21/14   Mariel Aloe, MD  hydrochlorothiazide (HYDRODIURIL) 25 MG tablet Take 1 tablet (25 mg total) by mouth daily. 11/15/13   Olin Hauser, DO  pantoprazole (PROTONIX) 40 MG tablet TAKE 1 TABLET BY MOUTH EVERY DAY 12/21/14   Olin Hauser, DO  predniSONE (DELTASONE) 20 MG tablet Take 2 tablets (40 mg total) by mouth daily. Take 40 mg by mouth daily for 3 days, then 20mg  by mouth daily for 3 days, then 10mg  daily for 3 days 03/27/15   Chesley Noon [place], PA-C   BP 147/85 mmHg  Pulse 90  Temp(Src) 98.3 F (36.8 C) (Oral)  Resp 16  Wt 209 lb (94.802 kg)  SpO2 100%    Physical Exam  Constitutional: He is oriented to person, place, and time. He appears well-developed and well-nourished.  HENT:  Head: Normocephalic and atraumatic.  Large bilateral nasal polyps  Eyes: EOM are normal.  Neck: Normal range of motion.  Cardiovascular: Normal rate.   Pulmonary/Chest: Effort normal. No respiratory distress.  Abdominal: Soft.  Musculoskeletal: Normal range of motion.  Neurological: He is alert and oriented to person, place, and time.  Skin: Skin is warm and dry.  Psychiatric: He has a normal mood and affect.  Nursing note and vitals reviewed.   ED Course  Procedures  DIAGNOSTIC STUDIES: Oxygen Saturation is 100% on RA, normal by my interpretation.    COORDINATION OF CARE: 5:18 PM Discussed next steps with pt. He verbalized understanding and is agreeable with the plan.   Labs Review Labs Reviewed - No data to display  Imaging Review No results found.   EKG Interpretation None      MDM   Final diagnoses:  Nasal polyps   Meds ordered this encounter  Medications  . predniSONE  (DELTASONE) 20 MG tablet    Sig: Take 2 tablets (40 mg total) by mouth daily. Take 40 mg by mouth daily for 3 days, then 20mg  by mouth daily for 3 days, then 10mg  daily for 3 days    Dispense:  12 tablet    Refill:  0    Order Specific Question:  Supervising Provider    Answer:  Ezequiel Essex [[id number]]   An After Visit Summary was printed and given to the patient.  I personally performed the services in this documentation, which was scribed in my presence.  The recorded information has been reviewed and considered.   Ronnald Collum.   Hollace Kinnier Odum, PA-C 06/02/15 1924  Julianne Rice, MD 06/07/15 2253

## 2015-06-02 NOTE — ED Notes (Signed)
The pt reports  That he has had difficulty breathing from his nose for years.  The pt reports that his symptoms are worse each am when he first gets up

## 2015-07-20 ENCOUNTER — Other Ambulatory Visit: Payer: Self-pay | Admitting: Family Medicine

## 2015-07-20 DIAGNOSIS — K219 Gastro-esophageal reflux disease without esophagitis: Secondary | ICD-10-CM

## 2016-01-18 ENCOUNTER — Encounter (HOSPITAL_COMMUNITY): Payer: Self-pay | Admitting: Emergency Medicine

## 2016-01-18 ENCOUNTER — Emergency Department (HOSPITAL_COMMUNITY)
Admission: EM | Admit: 2016-01-18 | Discharge: 2016-01-19 | Disposition: A | Discharge status: Home / Self Care | Payer: Medicare Other | Attending: Emergency Medicine | Admitting: Emergency Medicine

## 2016-01-18 DIAGNOSIS — R339 Retention of urine, unspecified: Secondary | ICD-10-CM | POA: Diagnosis not present

## 2016-01-18 DIAGNOSIS — D649 Anemia, unspecified: Secondary | ICD-10-CM | POA: Diagnosis not present

## 2016-01-18 DIAGNOSIS — I10 Essential (primary) hypertension: Secondary | ICD-10-CM | POA: Diagnosis not present

## 2016-01-18 DIAGNOSIS — K5641 Fecal impaction: Secondary | ICD-10-CM | POA: Diagnosis not present

## 2016-01-18 LAB — URINALYSIS, ROUTINE W REFLEX MICROSCOPIC
Bilirubin Urine: NEGATIVE
Glucose, UA: NEGATIVE mg/dL
HGB URINE DIPSTICK: NEGATIVE
KETONES UR: NEGATIVE mg/dL
LEUKOCYTES UA: NEGATIVE
Nitrite: NEGATIVE
PROTEIN: NEGATIVE mg/dL
Specific Gravity, Urine: 1.007 (ref 1.005–1.030)
pH: 6.5 (ref 5.0–8.0)

## 2016-01-18 LAB — CBC
HEMATOCRIT: 34 % — AB (ref 39.0–52.0)
Hemoglobin: 11 g/dL — ABNORMAL LOW (ref 13.0–17.0)
MCH: 27 pg (ref 26.0–34.0)
MCHC: 32.4 g/dL (ref 30.0–36.0)
MCV: 83.3 fL (ref 78.0–100.0)
PLATELETS: 338 10*3/uL (ref 150–400)
RBC: 4.08 MIL/uL — ABNORMAL LOW (ref 4.22–5.81)
RDW: 13.5 % (ref 11.5–15.5)
WBC: 9.7 10*3/uL (ref 4.0–10.5)

## 2016-01-18 LAB — BASIC METABOLIC PANEL
ANION GAP: 9 (ref 5–15)
BUN: 5 mg/dL — AB (ref 6–20)
CALCIUM: 9.1 mg/dL (ref 8.9–10.3)
CO2: 24 mmol/L (ref 22–32)
Chloride: 104 mmol/L (ref 101–111)
Creatinine, Ser: 0.74 mg/dL (ref 0.61–1.24)
GFR calc Af Amer: >60 mL/min (ref >60)
GLUCOSE: 139 mg/dL — AB (ref 65–99)
POTASSIUM: 3.6 mmol/L (ref 3.5–5.1)
SODIUM: 137 mmol/L (ref 135–145)

## 2016-01-18 MED ORDER — MAGNESIUM CITRATE PO SOLN
1.0000 | Freq: Once | ORAL | Status: AC
  Administered 2016-01-19: 1 via ORAL
  Filled 2016-01-18: qty 296

## 2016-01-18 NOTE — ED Provider Notes (Addendum)
[facility] DEPT Provider Note   CSN: [id number] Arrival date & time: 01/18/16  2045     History   Chief Complaint Chief Complaint  Patient presents with  . Urinary Retention  . Constipation    HPI Thomas Kline is a 65 y.o. male.  He complains of having hard stool which is difficult to pass. This is been going on for 3 weeks. He is not done anything to treat it. Today, he has noted he was unable to urinate since midafternoon. He has not been taking any narcotic analgesics. He denies abdominal pain, nausea, vomiting. He denies fever or chills.    Constipation      Past Medical History:  Diagnosis Date  . Intestinal polyps    colonoscopy from 05/06/11: multiple polyps and internal and external hemorrhoids. along with pan diverticulosis  . Pancolonic diverticulosis 05/06/11   diagnosed on colonoscopy from 04/2011 along with multiple polyps and internal and external hemorrhoids.   . Seizures [facility])     Patient Active Problem List   Diagnosis Date Noted  . Pancolonic diverticulosis 11/16/2013  . Abdominal bloating 11/15/2013  . GERD (gastroesophageal reflux disease) 03/22/2012  . Lightheadedness 12/09/2011  . Multiple nasal polyps 12/09/2011  . Hypertension 12/09/2011  . Acute blood loss anemia 05/03/2011  . Acute lower GI bleeding 05/03/2011  . Seizure ([place]) 05/03/2011    Past Surgical History:  Procedure Laterality Date  . COLONOSCOPY  05/06/2011   Procedure: COLONOSCOPY;  Surgeon: Beryle Beams, MD;  Location: [facility];  Service: Endoscopy;  Laterality: N/A;       Home Medications    Prior to Admission medications   Medication Sig Start Date End Date Taking? Authorizing Provider  fluticasone (FLONASE) 50 MCG/ACT nasal spray Place 2 sprays into both nostrils daily. 02/21/14   Mariel Aloe, MD  hydrochlorothiazide (HYDRODIURIL) 25 MG tablet Take 1 tablet (25 mg total) by mouth daily. 11/15/13   Olin Hauser, DO  pantoprazole (PROTONIX) 40 MG  tablet TAKE 1 TABLET BY MOUTH EVERY DAY 07/21/15   Olin Hauser, DO  predniSONE (DELTASONE) 20 MG tablet Take 2 tablets (40 mg total) by mouth daily. Take 40 mg by mouth daily for 3 days, then 20mg  by mouth daily for 3 days, then 10mg  daily for 3 days 06/02/15   Fransico [place], PA-C    Family History History reviewed. No pertinent family history.  Social History Social History  Substance Use Topics  . Smoking status: Never Smoker  . Smokeless tobacco: Never Used  . Alcohol use 0.6 oz/week    1 Cans of beer per week     Allergies   Review of patient's allergies indicates no known allergies.   Review of Systems Review of Systems  Gastrointestinal: Positive for constipation.  All other systems reviewed and are negative.    Physical Exam Updated Vital Signs BP (!) 198/103 (BP Location: Right Arm)   Pulse 108   Temp 98.2 F (36.8 C) (Oral)   Resp 20   Ht 6\' 1"  (1.854 m)   Wt 198 lb (89.8 kg)   SpO2 95%   BMI 26.12 kg/m   Physical Exam  Nursing note and vitals reviewed.  65 year old male, resting comfortably and in no acute distress. Vital signs are Significant for hypertension and mild tachycardia. Oxygen saturation is 95%, which is normal. Head is normocephalic and atraumatic. PERRLA, EOMI. Oropharynx is clear. Neck is nontender and supple without adenopathy or JVD. Back is nontender and there  is no CVA tenderness. Lungs are clear without rales, wheezes, or rhonchi. Chest is nontender. Heart has regular rate and rhythm without murmur. Abdomen is soft, flat, nontender without masses or hepatosplenomegaly and peristalsis is normoactive. Bladder is distended halfway to the umbilicus. Genitalia: Circumcised penis, testes descended. Rectal: Normal sphincter tone, moderately hard fecal impaction present with light brown stool. Extremities have no cyanosis or edema, full range of motion is present. Skin is warm and dry without rash. Neurologic: Mental status is  normal, cranial nerves are intact, there are no motor or sensory deficits.  ED Treatments / Results  Labs (all labs ordered are listed, but only abnormal results are displayed) Labs Reviewed  BASIC METABOLIC PANEL - Abnormal; Notable for the following:       Result Value   Glucose, Bld 139 (*)    BUN 5 (*)    All other components within normal limits  CBC - Abnormal; Notable for the following:    RBC 4.08 (*)    Hemoglobin 11.0 (*)    HCT 34.0 (*)    All other components within normal limits  URINALYSIS, ROUTINE W REFLEX MICROSCOPIC (NOT AT [facility])    Procedures Procedures (including critical care time) Foley Catheter Insertion Indication: Urinary retention Catheter was placed by RN and drained clear urine.  Medications Ordered in ED Medications  magnesium citrate solution 1 Bottle (not administered)     Initial Impression / Assessment and Plan / ED Course  I have reviewed the triage vital signs and the nursing notes.  Pertinent lab results that were available during my care of the patient were reviewed by me and considered in my medical decision making (see chart for details).  Clinical Course   Constipation with fecal impaction. Will give soapsuds enema. Urinary retention which may be partly secondary to the fecal impaction. Foley catheter was placed and is draining amber urine. Old records are reviewed, and he has no relevant past visits.  He had excellent relief of his impaction with a soapsuds enema. Foley catheter is removed and he is referred back to his primary care clinic.  Final Clinical Impressions(s) / ED Diagnoses   Final diagnoses:  Urinary retention  Fecal impaction in rectum (HCC)  Normochromic normocytic anemia    New Prescriptions New Prescriptions   No medications on file     Delora Fuel, MD [id number]    Delora Fuel, MD [id number]

## 2016-01-18 NOTE — ED Notes (Signed)
Catheter clamped rt 1045mL of output noted.

## 2016-01-18 NOTE — ED Notes (Signed)
Pt very anxious at triage.  Pt pacing in room, states he cannot sit down because it is too uncomfortable.  Pt then stated he felt like he needed to go to the bathroom.  RN escorted him to RR, ambulatory, gait steady and even.  Pt attempting to urinate at this time.

## 2016-01-18 NOTE — ED Notes (Signed)
Pt tolerated enema well 

## 2016-01-18 NOTE — ED Triage Notes (Signed)
Pt presents to ED for assessment of urinary retention starting this afternoon.  Pt sts he feel intense pain when trying to urinate, but "nothing will come out".  Pt also c/o  "very hard" stools and constipation x 3 weeks.

## 2016-01-19 NOTE — Discharge Instructions (Signed)
Stay on a high fiber diet. Take Miralax as needed for constipation.

## 2016-02-28 ENCOUNTER — Ambulatory Visit (INDEPENDENT_AMBULATORY_CARE_PROVIDER_SITE_OTHER): Payer: Self-pay | Admitting: Family Medicine

## 2016-02-28 ENCOUNTER — Encounter: Payer: Self-pay | Admitting: Family Medicine

## 2016-02-28 VITALS — BP 142/70 | HR 80 | Temp 97.4°F | Ht 75.0 in | Wt 218.6 lb

## 2016-02-28 DIAGNOSIS — K219 Gastro-esophageal reflux disease without esophagitis: Secondary | ICD-10-CM

## 2016-02-28 DIAGNOSIS — J339 Nasal polyp, unspecified: Secondary | ICD-10-CM

## 2016-02-28 DIAGNOSIS — R140 Abdominal distension (gaseous): Secondary | ICD-10-CM

## 2016-02-28 MED ORDER — PANTOPRAZOLE SODIUM 20 MG PO TBEC: 20.0000 mg | DELAYED_RELEASE_TABLET | Freq: Two times a day (BID) | ORAL | 0 refills | Status: DC

## 2016-02-28 MED ORDER — FLUTICASONE PROPIONATE 50 MCG/ACT NA SUSP: 2.0000 | Freq: Every day | NASAL | 0 refills | Status: DC

## 2016-02-28 NOTE — Patient Instructions (Addendum)
I have referred you to GI, they will evaluate you for your congestion. Start taking the Protonix twice daily For your nasal polyps, re-start the Flonase 2 sprays in each nostril daily If this is not improving, follow up with ENT specialist.

## 2016-02-28 NOTE — Assessment & Plan Note (Signed)
Re-iterated what was stated in last ENT note. Pt has not been using Flonase. - re-start Flonase - may benefit from f/u with ENT vs oral steroids in the future if intranasal steroids are not helpful.

## 2016-02-28 NOTE — Assessment & Plan Note (Signed)
Given patient's continued symptoms despite reportedly normal stools in the setting of GERD, will refer to GI for further evaluation.  Possibly poorly controled due to diet, esophagitis, stricture/acalasia, H pylori (although denies burning sensation).  He may benefit from EGD vs barium swallow in the future. No red flags on exam, not losing weight.  - will restart Protonix BID for now.

## 2016-02-28 NOTE — Progress Notes (Signed)
Subjective: CC:ED follow up HPI: Patient is a 65 y.o. male with a past medical history of GERD, pancolonic diverticulitis,  presenting to clinic today for ED follow up.  He denies any issues with constipation at this time. He has daily BMs that are soft. Urinating regularly. No blood in stool. No N/V.   He points to his epigastric region and states he feels like his "chest is congested" and then points to his nose and states he isn't sure if the 2 are related. He notes that this has been going on for years and is stable. He points out that he was seeing someone (looks like ENT) for nasal polyps, but was not doing what was prescribed due to costs. He notes he was using a nasal spray at some point but he doesn't recall the last time and if it was helpful (he was supposed to be on Flonase). As far as his "chest" which has also been going on for years, he states he feels "congested" all the time. He notes after he doesn't eat for a while, he doesn't feel congested any more. He feels like he has a "ball like" sensation in his abdomen most prominent at night time.  No cough, actual chest pain, SOB, rhinorrhea, nasal drainage, fevers. He used to take Protonix but states that he was told to stop this.  Of note, the patient is not a wonderful historian. On EMR review, he was last seen in 12/2013 for abdominal bloating. He had been undergoing diet changes previously with mild improvement in addition to Protonix.  Social History: never smoker  Flu Vaccine: no, declines today    ROS: All other systems reviewed and are negative.  Past Medical History Patient Active Problem List   Diagnosis Date Noted  . Pancolonic diverticulosis 11/16/2013  . Abdominal bloating 11/15/2013  . GERD (gastroesophageal reflux disease) 03/22/2012  . Lightheadedness 12/09/2011  . Multiple nasal polyps 12/09/2011  . Hypertension 12/09/2011  . Acute blood loss anemia 05/03/2011  . Acute lower GI bleeding 05/03/2011  .  Seizure ([place]) 05/03/2011    Medications- reviewed and updated  Objective: Office vital signs reviewed. BP (!) 142/70   Pulse 80   Temp 97.4 F (36.3 C) (Oral)   Ht 6\' 3"  (1.905 m)   Wt 218 lb 9.6 oz (99.2 kg)   BMI 27.32 kg/m    Physical Examination:  General: Awake, alert, well- nourished, NAD ENMT:  TMs intact, normal light reflex, no erythema, no bulging. Nasal turbinates almost completed occluded by nasal polyps, L>R. MMM, Oropharynx clear without erythema or tonsillar exudate/hypertrophy Cardio: RRR, no m/r/g noted. No thrill  Pulm: No increased WOB.  CTAB, without wheezes, rhonchi or crackles noted.  GI: soft, NT/ND,+BS x4, no hepatomegaly, no splenomegaly  Assessment/Plan: Abdominal bloating Given patient's continued symptoms despite reportedly normal stools in the setting of GERD, will refer to GI for further evaluation.  Possibly poorly controled due to diet, esophagitis, stricture/acalasia, H pylori (although denies burning sensation).  He may benefit from EGD vs barium swallow in the future. No red flags on exam, not losing weight.  - will restart Protonix BID for now.  Multiple nasal polyps Re-iterated what was stated in last ENT note. Pt has not been using Flonase. - re-start Flonase - may benefit from f/u with ENT vs oral steroids in the future if intranasal steroids are not helpful.    Orders Placed This Encounter  Procedures  . Ambulatory referral to Gastroenterology    Referral  Priority:   Routine    Referral Type:   Consultation    Referral Reason:   Specialty Services Required    Number of Visits Requested:   1    Meds ordered this encounter  Medications  . fluticasone (FLONASE) 50 MCG/ACT nasal spray    Sig: Place 2 sprays into both nostrils daily.    Dispense:  16 g    Refill:  0  . pantoprazole (PROTONIX) 20 MG tablet    Sig: Take 1 tablet (20 mg total) by mouth 2 (two) times daily.    Dispense:  60 tablet    Refill:  [place] PGY-3, [place]

## 2016-06-05 ENCOUNTER — Ambulatory Visit (INDEPENDENT_AMBULATORY_CARE_PROVIDER_SITE_OTHER): Payer: Self-pay | Admitting: Internal Medicine

## 2016-06-05 ENCOUNTER — Encounter: Payer: Self-pay | Admitting: Internal Medicine

## 2016-06-05 DIAGNOSIS — J339 Nasal polyp, unspecified: Secondary | ICD-10-CM

## 2016-06-05 DIAGNOSIS — R140 Abdominal distension (gaseous): Secondary | ICD-10-CM

## 2016-06-05 DIAGNOSIS — I10 Essential (primary) hypertension: Secondary | ICD-10-CM

## 2016-06-05 NOTE — Assessment & Plan Note (Signed)
Continued sensation of abdominal fullness. Patient has not been taking Protonix as prescribed, so poorly controlled GERD could be exacerbating his problem. Mild discomfort to deep palpation on abdominal exam. 6 pound weight loss in past 6 months. Discussed that patient should be taking Protonix and confirmed that he does not need a new prescription. Also stressed importance of seeing GI, and advised patient to call Dr. Ulyses Amor office today to set up a payment plan.  - Begin Protonix 20mg  BID - Provided patient with phone number and instructions to call [facility] to set up payment plan then schedule appt with Dr. Benson Norway  - F/u in three months to ensure patient has been seen and is not continuing to lose weight/worsening symptoms

## 2016-06-05 NOTE — Patient Instructions (Signed)
It was nice meeting you today Mr. Sankey!  Please begin taking the stomach medication (Protonix) every day. You will take one pill two times a day.  Continue to use the Flonase nasal spray as you have been.   It is very important to call the office of Dr. Benson Norway (the stomach doctor) to set up a payment plan to pay your bill as soon as you can. After this, you should be able to schedule an appointment with him to be seen. The phone number for Dr. Ulyses Amor office is [phone].   I would like to see you back in about three months to make sure you have been seen by Dr. Benson Norway and are doing better. We will also get bloodwork at this time.   If you have any questions or concerns, please feel free to call the clinic.   Be well,  Dr. Avon Gully

## 2016-06-05 NOTE — Progress Notes (Signed)
66 y.o. year old male presents for well male/preventative visit.  Of note, patient is poor historian and often difficult to understand throughout encounter.   Acute Concerns:  Nasal congestion Worse first thing in the morning. Has history of nasal polyps for which he has been seen by ENT. Has been using Flonase as prescribed. Says Flonase helps a lot. Denies difficulty breathing.   Epigastric fullness Patient still reporting sensation of epigastric fullness describe to Dr. Lorenso Courier at last visit in 02/2016. Dr. Lorenso Courier referred patient to GI (Dr. Benson Norway at [facility]), however as patient has an overdue balance at this practice, he cannot schedule an appointment there until he has settled his balance or arranged a payment plan. He was originally seen at Dr. Ulyses Amor office in 2013 for a colonoscopy. Confirmed this with Orson Eva during appointment today. Patient reports that the fullness does seem to improve when he has not eaten in a while. He has not been taking Protonix as prescribed by Dr. Lorenso Courier. Has history of GERD. Denies nausea, vomiting, hematochezia, melena.     Diet: Reports very healthy diet. Says he cooks all of his food rather than eating out. Eats a lot of citrus fruits as well as vegetables. Drinks primarily water. Tries to avoid all sodas.   Exercise: Reports walking multiple miles every day. Says he "walks more than anybody else."   Social:  Social History   Social History  . Marital status: Single    Spouse name: N/A  . Number of children: N/A  . Years of education: N/A   Social History Main Topics  . Smoking status: Never Smoker  . Smokeless tobacco: Never Used  . Alcohol use 0.6 oz/week    1 Cans of beer per week  . Drug use: No  . Sexual activity: Not Asked   Other Topics Concern  . None   Social History Narrative   Lives alone. Originally from [place] and has lived in the [place] for 26 years. Works as a Astronomer at [employer]   Past  Surgical History:  Procedure Laterality Date  . COLONOSCOPY  05/06/2011   Procedure: COLONOSCOPY;  Surgeon: Beryle Beams, MD;  Location: [facility];  Service: Endoscopy;  Laterality: N/A;   No Known Allergies Patient Active Problem List   Diagnosis Date Noted  . Pancolonic diverticulosis 11/16/2013  . Abdominal bloating 11/15/2013  . GERD (gastroesophageal reflux disease) 03/22/2012  . Lightheadedness 12/09/2011  . Multiple nasal polyps 12/09/2011  . Hypertension 12/09/2011  . Acute blood loss anemia 05/03/2011  . Acute lower GI bleeding 05/03/2011  . Seizure ([place]) 05/03/2011   Immunization:  There is no immunization history on file for this patient.  Cancer Screening:  Colonoscopy: 05/06/2011   Physical Exam: VITALS: Reviewed GEN: Pleasant male, NAD HEENT: Normocephalic, PERRL, EOMI, no scleral icterus, nasal septum midline, MMM, uvula midline CARDIAC:RRR, S1 and S2 present, no murmurs appreciated RESP: CTAB, normal effort, no wheezes or rhales ABD: Soft, no tenderness, normal bowel sounds; reports generalized discomfort but not pain with deep palpation EXT: No edema, 5/5 strength upper and lower extremities bilaterally SKIN: No rash  ASSESSMENT & PLAN: 66 y.o. male presents for annual well male/preventative exam. Please see problem specific assessment and plan.   Abdominal bloating Continued sensation of abdominal fullness. Patient has not been taking Protonix as prescribed, so poorly controlled GERD could be exacerbating his problem. Mild discomfort to deep palpation on abdominal exam. 6 pound weight loss in past 6 months. Discussed  that patient should be taking Protonix and confirmed that he does not need a new prescription. Also stressed importance of seeing GI, and advised patient to call Dr. Ulyses Amor office today to set up a payment plan.  - Begin Protonix 20mg  BID - Provided patient with phone number and instructions to call [facility] to set up payment  plan then schedule appt with Dr. Benson Norway  - F/u in three months to ensure patient has been seen and is not continuing to lose weight/worsening symptoms  Hypertension Has not been taking HCTZ as previously prescribed. Patient last seen for CPE in 2016 and has not been evaluated for HTN since. Hypertensive to 162/82 on initial BP measurement, however patient left before able to remeasure. Cr in 01/2016 WNL at 0.7. Not due for repeat labs today.  - F/u on BP at next appt in three months. Consider resuming HCTZ or another anti-hypertensive if remains elevated - BMP at f/u  Multiple nasal polyps Reporting nasal congestion that improves with Flonase. Is using Flonase as prescribed.  - Continue using Flonase - Can consider ENT f/u if worsening or Flonase becomes ineffective  Adin Hector, MD, MPH PGY-2 [facility] Pager [phone]

## 2016-06-05 NOTE — Assessment & Plan Note (Signed)
Reporting nasal congestion that improves with Flonase. Is using Flonase as prescribed.  - Continue using Flonase - Can consider ENT f/u if worsening or Flonase becomes ineffective

## 2016-06-05 NOTE — Assessment & Plan Note (Signed)
Has not been taking HCTZ as previously prescribed. Patient last seen for CPE in 2016 and has not been evaluated for HTN since. Hypertensive to 162/82 on initial BP measurement, however patient left before able to remeasure. Cr in 01/2016 WNL at 0.7. Not due for repeat labs today.  - F/u on BP at next appt in three months. Consider resuming HCTZ or another anti-hypertensive if remains elevated - BMP at f/u

## 2016-06-23 ENCOUNTER — Other Ambulatory Visit: Payer: Self-pay | Admitting: Family Medicine

## 2016-06-23 DIAGNOSIS — K219 Gastro-esophageal reflux disease without esophagitis: Secondary | ICD-10-CM

## 2016-07-24 ENCOUNTER — Other Ambulatory Visit: Payer: Self-pay | Admitting: Internal Medicine

## 2016-07-24 DIAGNOSIS — K219 Gastro-esophageal reflux disease without esophagitis: Secondary | ICD-10-CM

## 2016-09-30 ENCOUNTER — Encounter: Payer: Self-pay | Admitting: Internal Medicine

## 2016-09-30 ENCOUNTER — Ambulatory Visit (INDEPENDENT_AMBULATORY_CARE_PROVIDER_SITE_OTHER): Payer: Self-pay | Admitting: Internal Medicine

## 2016-09-30 DIAGNOSIS — R109 Unspecified abdominal pain: Secondary | ICD-10-CM

## 2016-09-30 NOTE — Progress Notes (Signed)
Patient stating that he feels well and does not actually need to be seen today. Asking me to call the GI office that he was seen out to tell them that they need to see him despite the fact that he owes them money. Encouraged patient to call himself to try to set up a payment plan or something similar, and explained that me calling to ask them to see him would not be effective. Will not charge patient for today's visit.   Adin Hector, MD, MPH PGY-3 [facility] Pager [phone]

## 2017-06-24 ENCOUNTER — Encounter: Payer: Self-pay | Admitting: Pediatric Intensive Care

## 2017-07-23 NOTE — Congregational Nurse Program (Signed)
Congregational Nurse Program Note  Date of Encounter: 06/24/2017  Past Medical History: Past Medical History:  Diagnosis Date  . Intestinal polyps    colonoscopy from 05/06/11: multiple polyps and internal and external hemorrhoids. along with pan diverticulosis  . Pancolonic diverticulosis 05/06/11   diagnosed on colonoscopy from 04/2011 along with multiple polyps and internal and external hemorrhoids.   . Seizures ([place])     Encounter Details: CNP Questionnaire - 06/24/17 1500      Questionnaire   Patient Status  Refugee    Race  African    Location Patient Served At  [facility]  Medicare    Uninsured  Not Applicable    Food  No food insecurities    Housing/Utilities  Yes, have permanent housing    Transportation  Yes, need transportation assistance    Interpersonal Safety  Yes, feel physically and emotionally safe where you currently live    Medication  No medication insecurities    Medical Provider  Yes    Referrals  Primary Care Provider/Clinic    ED Visit Averted  Not Applicable    Life-Saving Intervention Made  Not Applicable     New client encounter. Client states that he had been a patient at [facility] and had seen [facility] for E[facility] care. States history of nasal polyps making it difficult for him to breathe. CN reviewed chart. CN advises return to [facility] provider and to follow up with EN. CN will assist client in [place] application.

## 2017-08-12 ENCOUNTER — Encounter: Payer: Self-pay | Admitting: Pediatric Intensive Care

## 2017-08-12 NOTE — Congregational Nurse Program (Signed)
Congregational Nurse Program Note  Date of Encounter: 08/12/2017  Past Medical History: Past Medical History:  Diagnosis Date  . Intestinal polyps    colonoscopy from 05/06/11: multiple polyps and internal and external hemorrhoids. along with pan diverticulosis  . Pancolonic diverticulosis 05/06/11   diagnosed on colonoscopy from 04/2011 along with multiple polyps and internal and external hemorrhoids.   . Seizures ([place])     Encounter Details: CNP Questionnaire - 08/12/17 1630      Questionnaire   Patient Status  Refugee    Race  African    Location Patient Served At  [employer]    Uninsured  Not Applicable    Food  No food insecurities    Housing/Utilities  Yes, have permanent housing    Transportation  Yes, need transportation assistance    Interpersonal Safety  Yes, feel physically and emotionally safe where you currently live    Medication  No medication insecurities    Medical Provider  Yes    Referrals  Primary Care Provider/Clinic;Area Agency    ED Visit Averted  Not Applicable    Life-Saving Intervention Made  Not Applicable     Client requests assistance with SCAT form. States that he worked on the form with one of the case workers. CN obtained form from [place], case worker.Reviewed with client. Client signed information release on Part B. CN will take to SCAT office. Client has not made follow up appointment with Family Medicine clinic. CN called [facility]- client requests appointment for end of July. Advised client that CN will have to call back in 2 weeks when appointments open.

## 2017-08-19 ENCOUNTER — Encounter: Payer: Self-pay | Admitting: Pediatric Intensive Care

## 2017-09-22 ENCOUNTER — Ambulatory Visit: Payer: Medicare Other | Admitting: Family Medicine

## 2017-09-23 ENCOUNTER — Encounter: Payer: Self-pay | Admitting: Pediatric Intensive Care

## 2017-09-23 NOTE — Congregational Nurse Program (Signed)
Congregational Nurse Program Note  Date of Encounter: 09/23/2017  Past Medical History: Past Medical History:  Diagnosis Date  . Intestinal polyps    colonoscopy from 05/06/11: multiple polyps and internal and external hemorrhoids. along with pan diverticulosis  . Pancolonic diverticulosis 05/06/11   diagnosed on colonoscopy from 04/2011 along with multiple polyps and internal and external hemorrhoids.   . Seizures ([place])     Encounter Details: CNP Questionnaire - 09/23/17 1415      Questionnaire   Patient Status  Refugee    Race  African    Location Patient Served At  [employer] [facility]    Uninsured  Not Applicable    Food  No food insecurities    Housing/Utilities  Yes, have permanent housing    Transportation  Yes, need transportation assistance    Interpersonal Safety  Yes, feel physically and emotionally safe where you currently live    Medication  No medication insecurities    Medical Provider  Yes    Referrals  Primary Care Provider/Clinic    ED Visit Averted  Not Applicable    Life-Saving Intervention Made  Not Applicable     Client in clinic to talk about transportation and PCP visit. States that he missed his PCP appointment yesterday because he didn't know where the clinic was. Hen prompted, client remembered where the [facility] is. Client states he also missed his SCAT interview. CN will attempt to reschedule both appointment sand will review appointment information thoroughly with the client.

## 2017-09-24 NOTE — Congregational Nurse Program (Signed)
Congregational Nurse Program Note  Date of Encounter: 08/19/2017  Past Medical History: Past Medical History:  Diagnosis Date  . Intestinal polyps    colonoscopy from 05/06/11: multiple polyps and internal and external hemorrhoids. along with pan diverticulosis  . Pancolonic diverticulosis 05/06/11   diagnosed on colonoscopy from 04/2011 along with multiple polyps and internal and external hemorrhoids.   . Seizures ([place])     Encounter Details: Cn assisted client with completion of SCAT form. Appointment made with [facility] for 7/8. Appointment reminder given to client.

## 2017-10-06 ENCOUNTER — Ambulatory Visit (INDEPENDENT_AMBULATORY_CARE_PROVIDER_SITE_OTHER): Payer: Medicare Other | Admitting: Family Medicine

## 2017-10-06 VITALS — BP 150/80 | HR 84 | Temp 98.0°F | Ht 73.0 in | Wt 201.4 lb

## 2017-10-06 DIAGNOSIS — K219 Gastro-esophageal reflux disease without esophagitis: Secondary | ICD-10-CM | POA: Diagnosis not present

## 2017-10-06 DIAGNOSIS — J339 Nasal polyp, unspecified: Secondary | ICD-10-CM

## 2017-10-06 MED ORDER — FLUTICASONE PROPIONATE 50 MCG/ACT NA SUSP: 2.0000 | Freq: Every day | NASAL | 2 refills | Status: DC

## 2017-10-06 MED ORDER — LORATADINE-PSEUDOEPHEDRINE ER 10-240 MG PO TB24: 1.0000 | ORAL_TABLET | Freq: Every day | ORAL | 2 refills | Status: DC

## 2017-10-06 MED ORDER — PANTOPRAZOLE SODIUM 20 MG PO TBEC: 20.0000 mg | DELAYED_RELEASE_TABLET | Freq: Two times a day (BID) | ORAL | 2 refills | Status: AC

## 2017-10-06 NOTE — Patient Instructions (Signed)
It was great to meet you today! Thank you for letting me participate in your care!  Today, we discussed your nasal congestion and stomach pain. Your history of nasal polyps and congestion requires you to use Flonase everyday. I want you to spray each nostril 2 times per day twice a day. I have also prescribed Claritin-D to use to help with the congestion.  For your stomach pain it is likely due to Gastroesophageal Reflux Disease. Please take Pantoprazole as prescribed. It is waiting for you at the pharmacy.   I will see you in two weeks to do further work up of your GI bleed.  Be well, Harolyn Rutherford, DO PGY-2, [facility]

## 2017-10-06 NOTE — Progress Notes (Signed)
     Subjective: Chief Complaint  Patient presents with  . Nasal Congestion  . acid reflux symptoms     HPI: Thomas Kline is a 67 y.o. presenting to clinic today to discuss the following:  Nasal Congestion Patient is experiencing severe nasal congestion due to allergies and nasal polyps. He was seen by ENT for this some years ago and was told he would need to be on chronic medication. He has run out of all his medications and has not been taking them regularly. He presents today b/c his congestion has become increasingly worse.  He denies fever, chills, sore throat, cough, difficulty breathing. He does have rhinorrhea.  Health Maintenance: none today     ROS noted in HPI.   Past Medical, Surgical, Social, and Family History Reviewed & Updated per EMR.   Pertinent Historical Findings include:   Social History   Tobacco Use  Smoking Status Never Smoker  Smokeless Tobacco Never Used    Objective: BP (!) 150/80 (BP Location: Left Arm, Patient Position: Sitting, Cuff Size: Normal)   Pulse 84   Temp 98 F (36.7 C) (Oral)   Ht 6\' 1"  (1.854 m)   Wt 201 lb 6.4 oz (91.4 kg)   SpO2 100%   BMI 26.57 kg/m  Vitals and nursing notes reviewed  Physical Exam Gen: Alert and Oriented x 3, NAD HEENT: Normocephalic, atraumatic, PERRLA, EOMI, TM visible with good light reflex, swollen, boggy, erythematous turbinates with right nasal passage totally occluded, non-erythematous pharyngeal mucosa, no exudates Neck: trachea midline, no thyroidmegaly, no LAD CV: RRR, no murmurs, normal S1, S2 split Resp: CTAB, no wheezing, rales, or rhonchi, comfortable work of breathing  Ext: no clubbing, cyanosis, or edema Skin: warm, dry, intact, no rashes   No results found for this or any previous visit (from the past 72 hour(s)).  Assessment/Plan:  Multiple nasal polyps Refilled Flonase and started patient on Claritin-D.   PATIENT EDUCATION PROVIDED: See AVS    Diagnosis and plan along  with any newly prescribed medication(s) were discussed in detail with this patient today. The patient verbalized understanding and agreed with the plan. Patient advised if symptoms worsen return to clinic or ER.   Health Maintainance:   No orders of the defined types were placed in this encounter.   Meds ordered this encounter  Medications  . loratadine-pseudoephedrine (CLARITIN-D 24 HOUR) 10-240 MG 24 hr tablet    Sig: Take 1 tablet by mouth daily.    Dispense:  30 tablet    Refill:  2  . fluticasone (FLONASE) 50 MCG/ACT nasal spray    Sig: Place 2 sprays into both nostrils daily.    Dispense:  16 g    Refill:  2  . pantoprazole (PROTONIX) 20 MG tablet    Sig: Take 1 tablet (20 mg total) by mouth 2 (two) times daily.    Dispense:  60 tablet    Refill:  2     Harolyn Rutherford, DO 10/06/2017, 4:14 PM PGY-2 [place]

## 2017-10-13 NOTE — Assessment & Plan Note (Signed)
Refilled Flonase and started patient on Claritin-D.

## 2017-10-23 ENCOUNTER — Ambulatory Visit (INDEPENDENT_AMBULATORY_CARE_PROVIDER_SITE_OTHER): Payer: Medicare Other | Admitting: Family Medicine

## 2017-10-23 VITALS — BP 135/80 | HR 99 | Temp 97.8°F | Ht 73.0 in | Wt 206.8 lb

## 2017-10-23 DIAGNOSIS — J339 Nasal polyp, unspecified: Secondary | ICD-10-CM

## 2017-10-23 MED ORDER — LORATADINE 10 MG PO TABS: 10.0000 mg | ORAL_TABLET | Freq: Every day | ORAL | 11 refills | Status: AC

## 2017-10-23 MED ORDER — MONTELUKAST SODIUM 10 MG PO TABS: 10.0000 mg | ORAL_TABLET | Freq: Every day | ORAL | 11 refills | Status: AC

## 2017-10-23 NOTE — Progress Notes (Signed)
     Subjective: Chief Complaint  Patient presents with  . Nasal Congestion    nose stuffed     HPI: Thomas Kline is a 67 y.o. presenting to clinic today to discuss the following:  Nasal Congestion Patient endorses continued but improved nasal congestion. He states he has picked up the prescriptions I started for him at the last visit and has been using them as directed. He states that it is much improved. However, he is still congested and he still has significant left nasal passage congestion. No fevers, chills, cough, headache, or sore throat.   Health Maintenance: None     ROS noted in HPI.   Past Medical, Surgical, Social, and Family History Reviewed & Updated per EMR.   Pertinent Historical Findings include:   Social History   Tobacco Use  Smoking Status Never Smoker  Smokeless Tobacco Never Used    Objective: BP 135/80 (BP Location: Right Arm, Patient Position: Sitting, Cuff Size: Normal)   Pulse 99   Temp 97.8 F (36.6 C) (Oral)   Ht 6\' 1"  (1.854 m)   Wt 206 lb 12.8 oz (93.8 kg)   SpO2 100%   BMI 27.28 kg/m  Vitals and nursing notes reviewed  Physical Exam Gen: Alert and Oriented x 3, NAD HEENT: Normocephalic, atraumatic, PERRLA, EOMI, TM visible with good light reflex, swollen, erythematous turbinates especially on the left, non-erythematous pharyngeal mucosa, no exudates Neck: trachea midline, no thyroidmegaly, no LAD CV: RRR, no murmurs, normal S1, S2 split Resp: CTAB, no wheezing, rales, or rhonchi, comfortable work of breathing Ext: no clubbing, cyanosis, or edema Skin: warm, dry, intact, no rashes   No results found for this or any previous visit (from the past 72 hour(s)).  Assessment/Plan:  Multiple nasal polyps Patient had some improvement with restarting Flonase and Claritin D. However, he is still having congestion and his left nare is almost completely obstructed due to polyp. I have changed Claritin D to regular Claritin, and started him  on Montelukast to optimize medical therapy for his allergies and congestion.   He has seen ENT in the past and I discussed with patient that he would benefit from seeing them again and also consider having surgery to remove the nasal polyps. Patient expressed understanding and agreement.     PATIENT EDUCATION PROVIDED: See AVS    Diagnosis and plan along with any newly prescribed medication(s) were discussed in detail with this patient today. The patient verbalized understanding and agreed with the plan. Patient advised if symptoms worsen return to clinic or ER.   Health Maintainance:   Orders Placed This Encounter  Procedures  . Ambulatory referral to ENT    Referral Priority:   Routine    Referral Type:   Consultation    Referral Reason:   Specialty Services Required    Requested Specialty:   Otolaryngology    Number of Visits Requested:   1    Meds ordered this encounter  Medications  . loratadine (CLARITIN) 10 MG tablet    Sig: Take 1 tablet (10 mg total) by mouth daily.    Dispense:  30 tablet    Refill:  11  . montelukast (SINGULAIR) 10 MG tablet    Sig: Take 1 tablet (10 mg total) by mouth at bedtime.    Dispense:  30 tablet    Refill:  [place], DO 10/28/2017, 2:50 PM PGY-2 [place]

## 2017-10-23 NOTE — Patient Instructions (Addendum)
It was great to see you today! Thank you for letting me participate in your care!  Today, we discussed your continue nasal congestion. I want you to continue taking Flonase and Claritin. I have added Montelukast to help with the congestion. I have also referred you to ENT for evaluation of your nasal polyps. Please return to the office after you have been seen by ENT if your nasal congestion is not improved.  Be well, Harolyn Rutherford, DO PGY-2, [facility]

## 2017-10-28 NOTE — Assessment & Plan Note (Signed)
Patient had some improvement with restarting Flonase and Claritin D. However, he is still having congestion and his left nare is almost completely obstructed due to polyp. I have changed Claritin D to regular Claritin, and started him on Montelukast to optimize medical therapy for his allergies and congestion.   He has seen ENT in the past and I discussed with patient that he would benefit from seeing them again and also consider having surgery to remove the nasal polyps. Patient expressed understanding and agreement.

## 2017-11-02 NOTE — Congregational Nurse Program (Signed)
Client states he missed his PCP appointment yesterday. Would like assistance rescheduling. CN left message for [facility] regarding his application.

## 2018-04-15 ENCOUNTER — Encounter (HOSPITAL_BASED_OUTPATIENT_CLINIC_OR_DEPARTMENT_OTHER): Payer: Self-pay

## 2018-04-15 ENCOUNTER — Ambulatory Visit (HOSPITAL_BASED_OUTPATIENT_CLINIC_OR_DEPARTMENT_OTHER): Admit: 2018-04-15 | Payer: Medicare Other | Admitting: Otolaryngology

## 2018-04-15 SURGERY — MAXILLARY ANTROSTOMY
Anesthesia: General | Laterality: Bilateral

## 2019-06-03 ENCOUNTER — Other Ambulatory Visit: Payer: Self-pay

## 2019-06-03 ENCOUNTER — Emergency Department (HOSPITAL_COMMUNITY): Payer: Medicare Other

## 2019-06-03 ENCOUNTER — Observation Stay (HOSPITAL_COMMUNITY)
Admission: EM | Admit: 2019-06-03 | Discharge: 2019-06-04 | Disposition: A | Discharge status: Home / Self Care | Payer: Medicare Other | Attending: Family Medicine | Admitting: Family Medicine

## 2019-06-03 ENCOUNTER — Encounter (HOSPITAL_COMMUNITY): Payer: Self-pay | Admitting: Emergency Medicine

## 2019-06-03 DIAGNOSIS — E859 Amyloidosis, unspecified: Secondary | ICD-10-CM | POA: Diagnosis not present

## 2019-06-03 DIAGNOSIS — I10 Essential (primary) hypertension: Secondary | ICD-10-CM | POA: Diagnosis not present

## 2019-06-03 DIAGNOSIS — Z79899 Other long term (current) drug therapy: Secondary | ICD-10-CM | POA: Insufficient documentation

## 2019-06-03 DIAGNOSIS — K5730 Diverticulosis of large intestine without perforation or abscess without bleeding: Secondary | ICD-10-CM | POA: Diagnosis not present

## 2019-06-03 DIAGNOSIS — I6381 Other cerebral infarction due to occlusion or stenosis of small artery: Secondary | ICD-10-CM | POA: Insufficient documentation

## 2019-06-03 DIAGNOSIS — J324 Chronic pansinusitis: Secondary | ICD-10-CM | POA: Diagnosis not present

## 2019-06-03 DIAGNOSIS — I6782 Cerebral ischemia: Secondary | ICD-10-CM | POA: Insufficient documentation

## 2019-06-03 DIAGNOSIS — J339 Nasal polyp, unspecified: Secondary | ICD-10-CM | POA: Diagnosis not present

## 2019-06-03 DIAGNOSIS — K219 Gastro-esophageal reflux disease without esophagitis: Secondary | ICD-10-CM | POA: Diagnosis not present

## 2019-06-03 DIAGNOSIS — R569 Unspecified convulsions: Principal | ICD-10-CM | POA: Insufficient documentation

## 2019-06-03 DIAGNOSIS — Z20822 Contact with and (suspected) exposure to covid-19: Secondary | ICD-10-CM | POA: Insufficient documentation

## 2019-06-03 LAB — CBC WITH DIFFERENTIAL/PLATELET
Abs Immature Granulocytes: 0.06 10*3/uL (ref 0.00–0.07)
Basophils Absolute: 0 10*3/uL (ref 0.0–0.1)
Basophils Relative: 0 %
Eosinophils Absolute: 0.6 10*3/uL — ABNORMAL HIGH (ref 0.0–0.5)
Eosinophils Relative: 6 %
HCT: 39.5 % (ref 39.0–52.0)
Hemoglobin: 12.3 g/dL — ABNORMAL LOW (ref 13.0–17.0)
Immature Granulocytes: 1 %
Lymphocytes Relative: 21 %
Lymphs Abs: 2.3 10*3/uL (ref 0.7–4.0)
MCH: 28.1 pg (ref 26.0–34.0)
MCHC: 31.1 g/dL (ref 30.0–36.0)
MCV: 90.4 fL (ref 80.0–100.0)
Monocytes Absolute: 0.6 10*3/uL (ref 0.1–1.0)
Monocytes Relative: 5 %
Neutro Abs: 7.5 10*3/uL (ref 1.7–7.7)
Neutrophils Relative %: 67 %
Platelets: 329 10*3/uL (ref 150–400)
RBC: 4.37 MIL/uL (ref 4.22–5.81)
RDW: 13.1 % (ref 11.5–15.5)
WBC: 11.1 10*3/uL — ABNORMAL HIGH (ref 4.0–10.5)
nRBC: 0 % (ref 0.0–0.2)

## 2019-06-03 LAB — MAGNESIUM: Magnesium: 2.1 mg/dL (ref 1.7–2.4)

## 2019-06-03 LAB — RAPID URINE DRUG SCREEN, HOSP PERFORMED
Amphetamines: NOT DETECTED
Barbiturates: NOT DETECTED
Benzodiazepines: NOT DETECTED
Cocaine: NOT DETECTED
Opiates: NOT DETECTED
Tetrahydrocannabinol: NOT DETECTED

## 2019-06-03 LAB — URINALYSIS, ROUTINE W REFLEX MICROSCOPIC
Bilirubin Urine: NEGATIVE
Glucose, UA: NEGATIVE mg/dL
Ketones, ur: NEGATIVE mg/dL
Leukocytes,Ua: NEGATIVE
Nitrite: NEGATIVE
Protein, ur: 100 mg/dL — AB
Specific Gravity, Urine: 1.014 (ref 1.005–1.030)
pH: 5 (ref 5.0–8.0)

## 2019-06-03 LAB — POCT I-STAT EG7
Acid-base deficit: 4 mmol/L — ABNORMAL HIGH (ref 0.0–2.0)
Bicarbonate: 23.3 mmol/L (ref 20.0–28.0)
Calcium, Ion: 1.19 mmol/L (ref 1.15–1.40)
HCT: 38 % — ABNORMAL LOW (ref 39.0–52.0)
Hemoglobin: 12.9 g/dL — ABNORMAL LOW (ref 13.0–17.0)
O2 Saturation: 66 %
Potassium: 3.9 mmol/L (ref 3.5–5.1)
Sodium: 141 mmol/L (ref 135–145)
TCO2: 25 mmol/L (ref 22–32)
pCO2, Ven: 52.1 mmHg (ref 44.0–60.0)
pH, Ven: 7.26 (ref 7.250–7.430)
pO2, Ven: 40 mmHg (ref 32.0–45.0)

## 2019-06-03 LAB — ETHANOL: Alcohol, Ethyl (B): <10 mg/dL (ref <10)

## 2019-06-03 MED ORDER — LEVETIRACETAM IN NACL 1000 MG/100ML IV SOLN
1000.0000 mg | Freq: Once | INTRAVENOUS | Status: AC
  Administered 2019-06-03: 1000 mg via INTRAVENOUS
  Filled 2019-06-03: qty 100

## 2019-06-03 NOTE — ED Triage Notes (Signed)
Pt arrives by EMS with complaints of witnessed seizure. Pt initially unresponsive when EMS arrived on seen. Pt alert but confused during triage. Pt denies any history of seizures. Pt alert and oriented X2. Self and place

## 2019-06-03 NOTE — ED Provider Notes (Signed)
[place] EMERGENCY DEPARTMENT Provider Note   CSN: [id number] Arrival date & time: 06/03/19  1851     History Chief Complaint  Patient presents with  . Seizures    Thomas Kline is a 69 y.o. male.  Thomas Kline is a 69 year old gentleman with a history of HTN, GERD and seizures who presents with seizure-like activity.  Per EMS, the patient was on a public bus when he was found to pass out and had full body convulsions. The patient remained unresponsive for 10 minutes after EMS arrived.  Patient started to wake up, but was confused and did not know where he was.  By the time he got to the ED, the patient was alert and oriented to self, and place but not the year.  Patient did not receive any medications in route to the hospital and has not had any other seizure-like activity since being on the bus.        Past Medical History:  Diagnosis Date  . Intestinal polyps    colonoscopy from 05/06/11: multiple polyps and internal and external hemorrhoids. along with pan diverticulosis  . Pancolonic diverticulosis 05/06/11   diagnosed on colonoscopy from 04/2011 along with multiple polyps and internal and external hemorrhoids.   . Seizures [facility])    Patient Active Problem List   Diagnosis Date Noted  . Pancolonic diverticulosis 11/16/2013  . Abdominal bloating 11/15/2013  . GERD (gastroesophageal reflux disease) 03/22/2012  . Lightheadedness 12/09/2011  . Multiple nasal polyps 12/09/2011  . Hypertension 12/09/2011  . Acute blood loss anemia 05/03/2011  . Acute lower GI bleeding 05/03/2011  . Seizure ([place]) 05/03/2011   Past Surgical History:  Procedure Laterality Date  . COLONOSCOPY  05/06/2011   Procedure: COLONOSCOPY;  Surgeon: Beryle Beams, MD;  Location: [place];  Service: Endoscopy;  Laterality: N/A;      No family history on file.  Social History   Tobacco Use  . Smoking status: Never Smoker  . Smokeless tobacco: Never Used  Substance Use Topics    . Alcohol use: Yes    Alcohol/week: 1.0 standard drinks    Types: 1 Cans of beer per week  . Drug use: No   Home Medications Prior to Admission medications   Medication Sig Start Date End Date Taking? Authorizing Provider  fluticasone (FLONASE) 50 MCG/ACT nasal spray Place 2 sprays into both nostrils daily. 10/06/17   Nuala Alpha, DO  loratadine (CLARITIN) 10 MG tablet Take 1 tablet (10 mg total) by mouth daily. 10/23/17   Lockamy, Christia Reading, DO  montelukast (SINGULAIR) 10 MG tablet Take 1 tablet (10 mg total) by mouth at bedtime. 10/23/17   Lockamy, Christia Reading, DO  pantoprazole (PROTONIX) 20 MG tablet Take 1 tablet (20 mg total) by mouth 2 (two) times daily. 10/06/17   Nuala Alpha, DO   Allergies    Patient has no known allergies.  Review of Systems   Review of Systems  All other systems reviewed and are negative.  Physical Exam Updated Vital Signs There were no vitals taken for this visit.  Physical Exam Vitals reviewed.  Constitutional:      General: He is not in acute distress.    Appearance: He is obese. He is ill-appearing. He is not toxic-appearing.  HENT:     Head: Normocephalic and atraumatic.     Nose: Congestion present.     Comments: Large nasal polyps obstructing bilateral nares    Mouth/Throat:     Mouth: Mucous membranes are dry.  Pharynx: No posterior oropharyngeal erythema.  Eyes:     General:        Right eye: No discharge.        Left eye: No discharge.     Conjunctiva/sclera: Conjunctivae normal.     Pupils: Pupils are equal, round, and reactive to light.     Comments: Unable to assess EOMI, frequently looks away from finger  Cardiovascular:     Rate and Rhythm: Regular rhythm. Tachycardia present.     Heart sounds: Normal heart sounds. No murmur. No friction rub. No gallop.   Pulmonary:     Effort: Pulmonary effort is normal. No respiratory distress.     Breath sounds: Normal breath sounds. No wheezing or rales.  Abdominal:     General:  Abdomen is flat. Bowel sounds are normal.     Palpations: Abdomen is soft.  Genitourinary:    Comments: Pants soaked in urine Musculoskeletal:     Right lower leg: No edema.     Left lower leg: No edema.   Mental Status: Patient is awake, alert, oriented to place but not year No signs of aphasia or neglect Cranial Nerves: II: Pupils equal, round, and reactive to light.   III,IV, VI: Pt does not follow finger to the L with EOMI V: Facial sensation is symmetric to light touch  VII: Facial movement is symmetric.  VIII: hearing is intact to voice X: Uvula elevates symmetrically XI: Shoulder shrug is symmetric. XII: tongue is midline without atrophy or fasciculations.  Motor: 5/5 effort thorughout, at Least 5/5 bilateral UE, 5/5 bilateral lower extremitiy  Sensory: Sensation is grossly intact bilateral UEs & LEs Deep Tendon Reflexes: 2+ in all extremities Plantars: Toes are downgoing  Cerebellar: Normal gait and balance  ED Results / Procedures / Treatments   Labs (all labs ordered are listed, but only abnormal results are displayed) Labs Reviewed  CBC WITH DIFFERENTIAL/PLATELET  COMPREHENSIVE METABOLIC PANEL  RAPID URINE DRUG SCREEN, HOSP PERFORMED  MAGNESIUM  URINALYSIS, ROUTINE W REFLEX MICROSCOPIC  ETHANOL   EKG None  Radiology No results found.  Procedures Procedures (including critical care time)  Medications Ordered in ED Medications - No data to display  ED Course  I have reviewed the triage vital signs and the nursing notes.  Pertinent labs & imaging results that were available during my care of the patient were reviewed by me and considered in my medical decision making (see chart for details).  Clinical Course as of Jun 05 2054  Thu Jun 03, 2019  1938 I-STAT 7 Venous (Na,K,BLD GAS,ICA,H+H) not at [place]  2254 CT head demonstrated growth of nasal polyps and recommend follow-up imaging.  Talk to neurology, they recommend Keppra load and will  see the patient.  Consult placed to admit to medicine   [AA]  Fri Jun 04, 2019  0024 Family medicine will admit the patient   [AA]    Clinical Course User Index [AA] Earlene Plater, MD   MDM Rules/Calculators/A&P                      Patient's presentation appears to be consistent with seizure.  She states he does not remember the last time he had 1 and is still confused.  Likely postictal.  Will obtain CT of the head, EKG and labs.  Patient will likely have to be admitted once work-up is complete.  Final Clinical Impression(s) / ED Diagnoses Final diagnoses:  None    Rx /  DC Orders ED Discharge Orders    None       Quintella Reichert, MD 06/07/19 [phone]

## 2019-06-03 NOTE — Consult Note (Signed)
Requesting Physician: Dr. Ralene Bathe    Chief Complaint: Seizure  History obtained from: Patient and Chart    HPI:                                                                                                                                       Thomas Kline is a 69 y.o. male with past medical history of remote seizure, prior alcohol abuse diverticulosis presents with witnessed generalized tonic-clonic seizure lasting proximately 2-3 min and resolved spontaneously.  Patient was on a public bus when he had witnessed full body convulsions lasting for few minutes and then unresponsive for 10 minutes. By the time he arrived to ER patient is alert and oriented to himself.  Patient is currently back to his baseline.  He has never been on any seizure medications.  Last seizure was 4 years ago.  Denies family history of seizures/head trauma.  He states that he has abstained from alcohol since the beginning of this year   Past Medical History:  Diagnosis Date  . Intestinal polyps    colonoscopy from 05/06/11: multiple polyps and internal and external hemorrhoids. along with pan diverticulosis  . Pancolonic diverticulosis 05/06/11   diagnosed on colonoscopy from 04/2011 along with multiple polyps and internal and external hemorrhoids.   . Seizures ([place])     Past Surgical History:  Procedure Laterality Date  . COLONOSCOPY  05/06/2011   Procedure: COLONOSCOPY;  Surgeon: Beryle Beams, MD;  Location: [facility];  Service: Endoscopy;  Laterality: N/A;    No family history on file. Social History:  reports that he has never smoked. He has never used smokeless tobacco. He reports current alcohol use of about 1.0 standard drinks of alcohol per week. He reports that he does not use drugs.  Allergies: No Known Allergies  Medications:                                                                                                                        I reviewed home medications   ROS:  14 systems reviewed and negative except above    Examination:                                                                                                      General: Appears well-developed and well-nourished.  Psych: Affect appropriate to situation Eyes: No scleral injection HENT: Polyp in his left nostril Head: Normocephalic.  Cardiovascular: Normal rate and regular rhythm.  Respiratory: Effort normal and breath sounds normal to anterior ascultation GI: Soft.  No distension. There is no tenderness.  Skin: WDI    Neurological Examination Mental Status: Alert, oriented, thought content appropriate.  Speech fluent without evidence of aphasia. Able to follow 3 step commands without difficulty. Cranial Nerves: II: Visual fields grossly normal,  III,IV, VI: ptosis not present, extra-ocular motions intact bilaterally, pupils equal, round, reactive to light and accommodation V,VII: smile symmetric, facial light touch sensation normal bilaterally VIII: hearing normal bilaterally IX,X: uvula rises symmetrically XI: bilateral shoulder shrug XII: midline tongue extension Motor: Right : Upper extremity   5/5    Left:     Upper extremity   5/5  Lower extremity   5/5     Lower extremity   5/5 Tone and bulk:normal tone throughout; no atrophy noted Sensory: Pinprick and light touch intact throughout, bilaterally Deep Tendon Reflexes: 2+ and symmetric throughout Plantars: Right: downgoing   Left: downgoing Cerebellar: normal finger-to-nose, normal rapid alternating movements and normal heel-to-shin test Gait: normal gait and station     Lab Results: Basic Metabolic Panel: Recent Labs  Lab 06/03/19 1933 06/03/19 1953  NA  --  141  K  --  3.9  MG 2.1  --     CBC: Recent Labs  Lab 06/03/19 1933 06/03/19 1953  WBC 11.1*  --   NEUTROABS 7.5  --   HGB 12.3*  12.9*  HCT 39.5 38.0*  MCV 90.4  --   PLT 329  --     Coagulation Studies: No results for input(s): LABPROT, INR in the last 72 hours.  Imaging: CT Head Wo Contrast  Result Date: 06/03/2019 CLINICAL DATA:  Witnessed seizure. EXAM: CT HEAD WITHOUT CONTRAST TECHNIQUE: Contiguous axial images were obtained from the base of the skull through the vertex without intravenous contrast. COMPARISON:  September 10, 2014 FINDINGS: Brain: No evidence of acute infarction, hemorrhage, hydrocephalus, extra-axial collection or mass lesion/mass effect. 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 Chronic bilateral corona radiata microvascular ischemic changes. Interval chronic-appearing focal infarction in the right pons. Vascular: Mild calcified atherosclerosis of the bilateral carotid siphons. No hyperdense vessel sign. Skull: Intact cranium. Sinuses/Orbits: Chronic expansile thinning of the bilateral nasal bones and paranasal sinuses with interval near-complete and worsened opacification throughout the paranasal sinuses, nasal airways, and with increased extension into the posterior nasopharyngeal airway which is at least partially obstructing. Normal bilateral orbital soft tissues. Other: Normal scalp. IMPRESSION: No acute intracranial trauma or skull fracture. Interval worsening of chronic bilateral rhino-paranasal sinus disease with interval worsened near-complete opacification of these airways and increased extension into the posterior nasopharyngeal airway which is  at least partially obstructed. Nasal polyposis, amyloidosis, neoplasm or inspissated secretions or mucormycosis sinus disease are differential considerations. ENT consultation could be considered as clinically appropriate. CT paranasal sinuses without and with intravenous contrast could be considered. Chronic white matter microvascular ischemic changes and left peri insular ribbon lacunar infarction. Interval chronic-appearing focal  infarction of the right pons. Electronically Signed   By: Revonda Humphrey   On: 06/03/2019 21:02     I have reviewed the above imaging: CT head no acute findings   ASSESSMENT AND PLAN  69 y.o. male with past medical history of remote seizure, prior alcohol abuse diverticulosis presents with witnessed generalized tonic-clonic seizure lasting proximately 2-3 min.  Unresponsive for about 10 minutes afterwards, currently back to baseline.  Urine drug screen negative.  Denies recent alcohol abuse.  Generalized seizure  Recommendations -MRI brain with and without contrast -Routine EEG in the morning -Start patient with Keppra 500 mg twice daily -Seizure precautions -Ativan for seizure lasting greater than 2 minutes   Per [facility] statutes, patients with seizures are not allowed to drive until they have been seizure-free for six months. Use caution when using heavy equipment or power tools. Avoid working on ladders or at heights. Take showers instead of baths. Ensure the water temperature is not too high on the home water heater. Do not go swimming alone. Do not lock yourself in a room alone (i.e. bathroom). When caring for infants or small children, sit down when holding, feeding, or changing them to minimize risk of injury to the child in the event you have a seizure. Maintain good sleep hygiene. Avoid alcohol.    If Guilio Twiggs has another seizure, call 911 and bring them back to the ED if:       A.  The seizure lasts longer than 5 minutes.            B.  The patient doesn't wake shortly after the seizure or has new problems such as difficulty seeing, speaking or moving following the seizure       C.  The patient was injured during the seizure       D.  The patient has a temperature over 102 F (39C)       E.  The patient vomited during the seizure and now is having trouble breathing    Sushanth Aroor [facility] Pager Number [phone]

## 2019-06-04 ENCOUNTER — Observation Stay (HOSPITAL_COMMUNITY): Payer: Medicare Other

## 2019-06-04 ENCOUNTER — Other Ambulatory Visit (HOSPITAL_COMMUNITY): Payer: Self-pay

## 2019-06-04 ENCOUNTER — Encounter (HOSPITAL_COMMUNITY): Payer: Self-pay | Admitting: Family Medicine

## 2019-06-04 DIAGNOSIS — R569 Unspecified convulsions: Secondary | ICD-10-CM | POA: Diagnosis not present

## 2019-06-04 LAB — COMPREHENSIVE METABOLIC PANEL
ALT: 16 U/L (ref 0–44)
AST: 19 U/L (ref 15–41)
Albumin: 3.7 g/dL (ref 3.5–5.0)
Alkaline Phosphatase: 111 U/L (ref 38–126)
Anion gap: 12 (ref 5–15)
BUN: 11 mg/dL (ref 8–23)
CO2: 23 mmol/L (ref 22–32)
Calcium: 8.8 mg/dL — ABNORMAL LOW (ref 8.9–10.3)
Chloride: 104 mmol/L (ref 98–111)
Creatinine, Ser: 1.1 mg/dL (ref 0.61–1.24)
GFR calc Af Amer: >60 mL/min (ref >60)
GFR calc non Af Amer: >60 mL/min (ref >60)
Glucose, Bld: 190 mg/dL — ABNORMAL HIGH (ref 70–99)
Potassium: 4 mmol/L (ref 3.5–5.1)
Sodium: 139 mmol/L (ref 135–145)
Total Bilirubin: 0.3 mg/dL (ref 0.3–1.2)
Total Protein: 7 g/dL (ref 6.5–8.1)

## 2019-06-04 LAB — SARS CORONAVIRUS 2 (TAT 6-24 HRS): SARS Coronavirus 2: NEGATIVE

## 2019-06-04 LAB — HIV ANTIBODY (ROUTINE TESTING W REFLEX): HIV Screen 4th Generation wRfx: NONREACTIVE

## 2019-06-04 MED ORDER — LORATADINE 10 MG PO TABS
10.0000 mg | ORAL_TABLET | Freq: Every day | ORAL | Status: DC
  Administered 2019-06-04: 10 mg via ORAL
  Filled 2019-06-04: qty 1

## 2019-06-04 MED ORDER — ENOXAPARIN SODIUM 40 MG/0.4ML ~~LOC~~ SOLN
40.0000 mg | SUBCUTANEOUS | Status: DC
  Filled 2019-06-04: qty 0.4

## 2019-06-04 MED ORDER — LEVETIRACETAM IN NACL 500 MG/100ML IV SOLN
500.0000 mg | Freq: Two times a day (BID) | INTRAVENOUS | Status: DC
  Administered 2019-06-04: 500 mg via INTRAVENOUS
  Filled 2019-06-04 (×3): qty 100

## 2019-06-04 MED ORDER — HYDROCHLOROTHIAZIDE 12.5 MG PO CAPS
12.5000 mg | ORAL_CAPSULE | Freq: Every day | ORAL | Status: DC
  Administered 2019-06-04: 12.5 mg via ORAL
  Filled 2019-06-04: qty 1

## 2019-06-04 MED ORDER — MONTELUKAST SODIUM 10 MG PO TABS: 10.0000 mg | ORAL_TABLET | Freq: Every day | ORAL | Status: DC

## 2019-06-04 MED ORDER — PANTOPRAZOLE SODIUM 20 MG PO TBEC
20.0000 mg | DELAYED_RELEASE_TABLET | Freq: Two times a day (BID) | ORAL | Status: DC
  Administered 2019-06-04: 20 mg via ORAL
  Filled 2019-06-04 (×3): qty 1

## 2019-06-04 MED ORDER — FLUTICASONE PROPIONATE 50 MCG/ACT NA SUSP
2.0000 | Freq: Every day | NASAL | Status: DC
  Administered 2019-06-04: 2 via NASAL
  Filled 2019-06-04 (×2): qty 16

## 2019-06-04 MED ORDER — FLUTICASONE PROPIONATE 50 MCG/ACT NA SUSP: 2.0000 | Freq: Every day | NASAL | Status: DC

## 2019-06-04 MED ORDER — HYDROCHLOROTHIAZIDE 12.5 MG PO CAPS: 12.5000 mg | ORAL_CAPSULE | Freq: Every day | ORAL | 0 refills | Status: DC

## 2019-06-04 MED ORDER — GADOBUTROL 1 MMOL/ML IV SOLN
10.0000 mL | Freq: Once | INTRAVENOUS | Status: AC | PRN
  Administered 2019-06-04: 10 mL via INTRAVENOUS

## 2019-06-04 MED ORDER — LEVETIRACETAM 500 MG PO TABS: 500.0000 mg | ORAL_TABLET | Freq: Two times a day (BID) | ORAL | 0 refills | Status: DC

## 2019-06-04 NOTE — Discharge Instructions (Signed)
You should not drive for 6 months.  You have been referred to a neurologist.  If you don't hear from them in 2 weeks, call your PCP and let him know.    Continue to take keppra twice daily.  DO NOT STOP TAKING this medication.  We also started you on a new blood pressure medication. You should take this every day.  DO NOT STOP TAKING unless you are told by a doctor.

## 2019-06-04 NOTE — Procedures (Signed)
Patient Name: Thomas Kline  MRN: [id number]  Epilepsy Attending: Lora Havens  Referring Physician/Provider: Dr. Karena Addison Aroor Date: 06/04/2019 Duration: 28.34 minutes  Patient history: 69 year old male with history of prior alcohol abuse and seizures who presented with generalized tonic-clonic seizure.  EEG to assess for seizures.  Level of alertness: Awake  AEDs during EEG study: Keppra  Technical aspects: This EEG study was done with scalp electrodes positioned according to the 10-20 International system of electrode placement. Electrical activity was acquired at a sampling rate of 500Hz  and reviewed with a high frequency filter of 70Hz  and a low frequency filter of 1Hz . EEG data were recorded continuously and digitally stored.  Description: The posterior dominant rhythm consists of 7 Hz activity of moderate voltage (25-35 uV) seen predominantly in posterior head regions, symmetric and reactive to eye opening and eye closing.  EEG also showed continuous generalized polymorphic 5 to 7 Hz theta slowing as well as intermittent generalized 2 to 3 Hz delta slowing.  Physiologic photic driving was not seen during photic stimulation.  Hyperventilation was not performed.  Abnormality - Continued slow, generalized - Background slow          IMPRESSION: This study is suggestive of mild diffuse encephalopathy, nonspecific etiology. No seizures or epileptiform discharges were seen throughout the recording.  Shawntrice Salle Barbra Sarks

## 2019-06-04 NOTE — Evaluation (Signed)
Physical Therapy Evaluation Patient Details Name: Thomas Kline MRN: [id number] DOB: 1950-07-04 Today's Date: 06/04/2019   History of Present Illness  69 y.o. male presenting with seizure-like activity. PMH is significant for HTN, GERD, nasal polyps, pancolonic diverticulosis, and seizures. patient had what was described as full body convulsions for 2-3 minutes followed by 10 minutes of unresponsiveness. CT head obtained which shows no acute intracranial trauma or skull fracture  Clinical Impression  Patient evaluated by Physical Therapy with no further acute PT needs identified. All education has been completed and the patient has no further questions. PT is independent with bed mobility and transfers, and mod I for ambulation of 1000 feet without AD.  Pt has no follow-up Physical Therapy or equipment needs. PT is signing off. Thank you for this referral.     Follow Up Recommendations No PT follow up    Equipment Recommendations  None recommended by PT    Recommendations for Other Services       Precautions / Restrictions Precautions Precautions: Fall Precaution Comments: seizures Restrictions Weight Bearing Restrictions: No      Mobility  Bed Mobility Overal bed mobility: Independent             General bed mobility comments: bed flat, no use of bedrails  Transfers Overall transfer level: Independent               General transfer comment: good power up and steadying   Ambulation/Gait Ambulation/Gait assistance: Modified independent (Device/Increase time) Gait Distance (Feet): 1000 Feet Assistive device: None Gait Pattern/deviations: WFL(Within Functional Limits);Step-through pattern Gait velocity: WFL Gait velocity interpretation: >2.62 ft/sec, indicative of community ambulatory General Gait Details: slow, steady gait      Balance Overall balance assessment: Independent                                           Pertinent  Vitals/Pain Pain Assessment: No/denies pain    Home Living Family/patient expects to be discharged to:: Private residence Living Arrangements: Alone   Type of Home: Apartment Home Access: Elevator;Level entry     Home Layout: One level Home Equipment: None      Prior Function Level of Independence: Independent                  Extremity/Trunk Assessment   Upper Extremity Assessment Upper Extremity Assessment: Overall WFL for tasks assessed    Lower Extremity Assessment Lower Extremity Assessment: Overall WFL for tasks assessed       Communication   Communication: No difficulties;Other (comment)(thick accent)  Cognition Arousal/Alertness: Awake/alert Behavior During Therapy: WFL for tasks assessed/performed Overall Cognitive Status: Within Functional Limits for tasks assessed                                        General Comments General comments (skin integrity, edema, etc.): no dizziness      Assessment/Plan    PT Assessment Patent does not need any further PT services         PT Goals (Current goals can be found in the Care Plan section)  Acute Rehab PT Goals Patient Stated Goal: go home PT Goal Formulation: With patient     AM-PAC PT "6 Clicks" Mobility  Outcome Measure Help needed turning from your back to your side while  in a flat bed without using bedrails?: None Help needed moving from lying on your back to sitting on the side of a flat bed without using bedrails?: None Help needed moving to and from a bed to a chair (including a wheelchair)?: None Help needed standing up from a chair using your arms (e.g., wheelchair or bedside chair)?: None Help needed to walk in hospital room?: None Help needed climbing 3-5 steps with a railing? : None 6 Click Score: 24    End of Session Equipment Utilized During Treatment: Gait belt Activity Tolerance: Patient tolerated treatment well Patient left: in bed;with call bell/phone within  reach;with bed alarm set Nurse Communication: Mobility status      Time: [id number] PT Time Calculation (min) (ACUTE ONLY): 15 min   Charges:   PT Evaluation $PT Eval Low Complexity: 1 Low          Sam Wunschel B. Migdalia Dk PT, DPT Acute Rehabilitation Services Pager [phone] Office [phone]   [place] 06/04/2019, 9:44 AM

## 2019-06-04 NOTE — ED Notes (Signed)
Attempted to call report x2. Charge nurse advised RN to give bedside report.

## 2019-06-04 NOTE — Care Management Obs Status (Signed)
[facility] NOTIFICATION   Patient Details  Name: Thomas Kline MRN: [id number] Date of Birth: [birth year]   Medicare Observation Status Notification Given:  Yes    Marilu Favre, RN 06/04/2019, 12:52 PM

## 2019-06-04 NOTE — Progress Notes (Signed)
Family Medicine Teaching Service Daily Progress Note Intern Pager: [phone]  Patient name: Thomas Kline Medical record number: [id number] Date of birth: 04/01/1950 Age: 69 y.o. Gender: male  Primary Care Provider: Nuala Alpha, DO Consultants: neuro Code Status: DNI  Pt Overview and Major Events to Date:  3/19- admitted 3/19- EEG, brain MRI  Assessment and Plan: Thomas Kline a 69 y.o.malepresenting with seizure-like activity. PMH is significant forHTN, GERD, nasal polyps, pancolonic diverticulosis, and seizures.  Seizure-like activity- no new seizure like activity since admission -Neurology consulted, appreciate recommendations -F/U MR brain -Keppra 500mg  BID - PRN ativan - EEG -PT/OT  HTN- [id number] systolics overnight and consistently elevated. CT head showing signs of chronic HTN.  - initiating HCTZ 12.5mg  today, monitor BP and consider second agent as needed.  Chronic pansinusitis with obstructing nasal polyposis- consider CT paranasal sinuses with and without IV contrast to further evaluate per radiology recs. -Continue flonase, claritin, singulair - f/u HIV - likely OP f/u  FEN/GI: regular diet Prophylaxis: lovenox  Disposition: pending neuro workup  Subjective:  Patient states that he has improvement in abdominal bloating? He has no concerns at this time but was curious about his nasal polyp.  Objective: Temp:  [98.5 F (36.9 C)] 98.5 F (36.9 C) (03/19 0545) Pulse Rate:  [72-106] 81 (03/19 0558) Resp:  [13-23] 16 (03/19 0558) BP: (141-179)/(79-99) 169/90 (03/19 0558) SpO2:  [96 %-100 %] 96 % (03/19 0558) Weight:  [id number] kg] 108 kg (03/18 1927) Physical Exam: General: NAD, pleasant sitting in chair. Respiratory: no conversational dyspnea. No IWB HEENT: please see clinical image for further detail.     Laboratory: Recent Labs  Lab 06/03/19 1933 06/03/19 1953  WBC 11.1*  --   HGB 12.3* 12.9*  HCT 39.5 38.0*  PLT 329  --    Recent Labs    Lab 06/03/19 1933 06/03/19 1953  NA 139 141  K 4.0 3.9  CL 104  --   CO2 23  --   BUN 11  --   CREATININE 1.10  --   CALCIUM 8.8*  --   PROT 7.0  --   BILITOT 0.3  --   ALKPHOS 111  --   ALT 16  --   AST 19  --   GLUCOSE 190*  --    COVID neg HIV pending Ethanol and UDS neg  Urinalysis    Component Value Date/Time   COLORURINE STRAW (A) 06/03/2019 1933   APPEARANCEUR CLEAR 06/03/2019 1933   LABSPEC 1.014 06/03/2019 1933   PHURINE 5.0 06/03/2019 1933   GLUCOSEU NEGATIVE 06/03/2019 1933   HGBUR SMALL (A) 06/03/2019 1933   BILIRUBINUR NEGATIVE 06/03/2019 1933   KETONESUR NEGATIVE 06/03/2019 1933   PROTEINUR 100 (A) 06/03/2019 1933   UROBILINOGEN 0.2 05/04/2011 1316   NITRITE NEGATIVE 06/03/2019 1933   LEUKOCYTESUR NEGATIVE 06/03/2019 1933   Imaging/Diagnostic Tests: CT Head Wo Contrast  Result Date: 06/03/2019 CLINICAL DATA:  Witnessed seizure. EXAM: CT HEAD WITHOUT CONTRAST TECHNIQUE: Contiguous axial images were obtained from the base of the skull through the vertex without intravenous contrast. COMPARISON:  September 10, 2014 FINDINGS: Brain: No evidence of acute infarction, hemorrhage, hydrocephalus, extra-axial collection or mass lesion/mass effect. 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 Chronic bilateral corona radiata microvascular ischemic changes. Interval chronic-appearing focal infarction in the right pons. Vascular: Mild calcified atherosclerosis of the bilateral carotid siphons. No hyperdense vessel sign. Skull: Intact cranium. Sinuses/Orbits: Chronic expansile thinning of the bilateral nasal bones and paranasal sinuses with  interval near-complete and worsened opacification throughout the paranasal sinuses, nasal airways, and with increased extension into the posterior nasopharyngeal airway which is at least partially obstructing. Normal bilateral orbital soft tissues. Other: Normal scalp. IMPRESSION: No acute intracranial trauma or skull  fracture. Interval worsening of chronic bilateral rhino-paranasal sinus disease with interval worsened near-complete opacification of these airways and increased extension into the posterior nasopharyngeal airway which is at least partially obstructed. Nasal polyposis, amyloidosis, neoplasm or inspissated secretions or mucormycosis sinus disease are differential considerations. ENT consultation could be considered as clinically appropriate. CT paranasal sinuses without and with intravenous contrast could be considered. Chronic white matter microvascular ischemic changes and left peri insular ribbon lacunar infarction. Interval chronic-appearing focal infarction of the right pons. Electronically Signed   By: Revonda Humphrey   On: 06/03/2019 21:02    Richarda Osmond, DO 06/04/2019, 7:52 AM PGY-2, [place] Intern pager: [phone], text pages welcome

## 2019-06-04 NOTE — TOC Initial Note (Signed)
Transition of Care [facility]) - Initial/Assessment Note    Patient Details  Name: Thomas Kline MRN: [id number] Date of Birth: 21-May-1950  [facility] of [facility]) CM/SW Contact:    Marilu Favre, RN Phone Number: 06/04/2019, 12:50 PM  Clinical Narrative:                 Confirmed face sheet information with patient. Patient from home alone. Has PCP and can get prescriptions filled. Uses bus for transportation.   Expected Discharge Plan: Home/Self Care Barriers to Discharge: Continued Medical Work up   Patient Goals and CMS Choice Patient states their goals for this hospitalization and ongoing recovery are:: to go home CMS Medicare.gov Compare Post Acute Care list provided to:: Patient Choice offered to / list presented to : NA  Expected Discharge Plan and Services Expected Discharge Plan: Home/Self Care   Discharge Planning Services: NA Post Acute Care Choice: NA Living arrangements for the past 2 months: Apartment                 DME Arranged: N/A         HH Arranged: NA          Prior Living Arrangements/Services Living arrangements for the past 2 months: Apartment Lives with:: Self Patient language and need for interpreter reviewed:: Yes Do you feel safe going back to the place where you live?: Yes      Need for Family Participation in Patient Care: No (Comment) Care giver support system in place?: Yes (comment)   Criminal Activity/Legal Involvement Pertinent to Current Situation/Hospitalization: No - Comment as needed  Activities of Daily Living Home Assistive Devices/Equipment: None ADL Screening (condition at time of admission) Patient's cognitive ability adequate to safely complete daily activities?: Yes Is the patient deaf or have difficulty hearing?: No Does the patient have difficulty seeing, even when wearing glasses/contacts?: No Does the patient have difficulty concentrating, remembering, or making decisions?: No Patient able to express need for  assistance with ADLs?: Yes Does the patient have difficulty dressing or bathing?: No Independently performs ADLs?: Yes (appropriate for developmental age) Does the patient have difficulty walking or climbing stairs?: No Weakness of Legs: None Weakness of Arms/Hands: None  Permission Sought/Granted   Permission granted to share information with : No              Emotional Assessment Appearance:: Appears stated age Attitude/Demeanor/Rapport: Engaged Affect (typically observed): Accepting Orientation: : Oriented to Self, Oriented to Place, Oriented to  Time, Oriented to Situation Alcohol / Substance Use: Not Applicable Psych Involvement: No (comment)  Admission diagnosis:  Seizure [facility]) [R56.9] Patient Active Problem List   Diagnosis Date Noted  . Pancolonic diverticulosis 11/16/2013  . Abdominal bloating 11/15/2013  . GERD (gastroesophageal reflux disease) 03/22/2012  . Lightheadedness 12/09/2011  . Multiple nasal polyps 12/09/2011  . Hypertension 12/09/2011  . Acute blood loss anemia 05/03/2011  . Acute lower GI bleeding 05/03/2011  . Seizure ([place]) 05/03/2011   PCP:  Nuala Alpha, DO Pharmacy:   [facility] [place], [address] [place] [address] Phone: [phone] Fax: [phone]     Social Determinants of Health (SDOH) Interventions    Readmission Risk Interventions No flowsheet data found.

## 2019-06-04 NOTE — Progress Notes (Signed)
No further seizures.  He is awake, alert, interactive and appropriate.  Without symptoms, I am uncertain of the significance of the possible cervical spine findings, could discuss with neurosurgery but do not feel this needs to be done urgently.  He has now had more than 1 seizure, and therefore will need to be maintained on antiepileptic therapy.  At this time, no further recommendations, continue Keppra.  He will need to be told to not drive for 6 months.  Roland Rack, MD [facility] [phone]  If 7pm- 7am, please page neurology on call as listed in [place].

## 2019-06-04 NOTE — Discharge Summary (Addendum)
[facility] Discharge Summary  Patient name: Thomas Kline Medical record number: [id number] Date of birth: 1950/03/30 Age: 69 y.o. Gender: male Date of Admission: 06/03/2019  Date of Discharge: 06/04/2019 Admitting Physician: Lind Covert, MD  Primary Care Provider: Nuala Alpha, DO Consultants: Neurology  Indication for Hospitalization: Seizure-like activity  Discharge Diagnoses/Problem List:  Seizure-like activity Hypertension Chronic pansinusitis with obstructing nasal polyposis  Disposition: home  Discharge Condition: Stable, improved  Discharge Exam:  Physical Exam: General: NAD, pleasant sitting in chair. Respiratory: no conversational dyspnea. No IWB HEENT: please see clinical image for further detail.    Brief Hospital Course:  Thomas Kline a 69 y.o.malepresenting with seizure-like activity. PMH is significant forHTN, GERD, nasal polyps, pancolonic diverticulosis, and seizures.  Admission details can be found in H&P.  Seizure-like activity CT head performed, no acute intracranial trauma or skull fracture.  Patient did have worsening of chronic bilateral Rhino paranasal sinus disease, chronic white matter microvascular ischemic changes and left periinsular ribbon lacunar infarction, with interval chronic appearing focal infarction of right pons.  Neurology was consulted in ED.  Recommended starting Keppra 500 mg twice daily, perform EEG and MRI.  MRI was negative for acute process, showed that the patient's nasal polyp was not extending into the intracranial or intraorbital region.  EEG was suggestive of mild diffuse encephalopathy with no specific etiology, no seizures epileptiform discharges were noted.  Neurology recommended continuing Keppra 500 mg twice daily and for patient not to drive for 6 months.  Recommended follow-up in 2 weeks as an outpatient.  Patient did not have any further seizure-like activity while inpatient, he  was discharged home with stable vital signs.  He was advised to continue Keppra twice daily.  Hypertension Patient noted to have chronic hypertension while inpatient.  Appears to have been on other blood pressure medications in the past but has stopped taking them.  Started hydrochlorothiazide 12.5 mg daily.  Patient tolerated the addition of this medication well.  Issues for Follow Up:   1. MRI with Diffuse decrease of T1 signal within the visualized upper cervical spine with inversion of the cervical curvature and C4-5 fusion. Correlation with dedicated study suggested.  Neurology recommended eventual neurosurgery discussion, ensure that this occurs at neurology follow up. 2. Patient was referred to neurology on d/c.  Ensure follow up.  Patient should not drive for 6 months. 3. Patient was started on HCTZ while inpatient.  Monitor BP and repeat BMP in 4-6 weeks. 4.  It appears that patient has previously thought that he "completes medications" after finishing the bottle that he has.  Please ensure the patient continues to stay on his chronic medications. 5.  Ensure ENT follow-up for patient's nasal polyp  Significant Procedures:  EEG  Significant Labs and Imaging:  Recent Labs  Lab 06/03/19 1933 06/03/19 1953  WBC 11.1*  --   HGB 12.3* 12.9*  HCT 39.5 38.0*  PLT 329  --    Recent Labs  Lab 06/03/19 1933 06/03/19 1953  NA 139 141  K 4.0 3.9  CL 104  --   CO2 23  --   GLUCOSE 190*  --   BUN 11  --   CREATININE 1.10  --   CALCIUM 8.8*  --   MG 2.1  --   ALKPHOS 111  --   AST 19  --   ALT 16  --   ALBUMIN 3.7  --     EEG  Result Date: 06/04/2019 Zeb Comfort  Jenetta Downer, MD     06/04/2019  2:22 PM Patient Name: Thomas Kline MRN: [id number] Epilepsy Attending: Lora Havens Referring Physician/Provider: Dr. Karena Addison Aroor Date: 06/04/2019 Duration: 28.34 minutes Patient history: 69 year old male with history of prior alcohol abuse and seizures who presented with generalized  tonic-clonic seizure.  EEG to assess for seizures. Level of alertness: Awake AEDs during EEG study: Keppra Technical aspects: This EEG study was done with scalp electrodes positioned according to the 10-20 International system of electrode placement. Electrical activity was acquired at a sampling rate of 500Hz  and reviewed with a high frequency filter of 70Hz  and a low frequency filter of 1Hz . EEG data were recorded continuously and digitally stored. Description: The posterior dominant rhythm consists of 7 Hz activity of moderate voltage (25-35 uV) seen predominantly in posterior head regions, symmetric and reactive to eye opening and eye closing.  EEG also showed continuous generalized polymorphic 5 to 7 Hz theta slowing as well as intermittent generalized 2 to 3 Hz delta slowing.  Physiologic photic driving was not seen during photic stimulation.  Hyperventilation was not performed. Abnormality - Continued slow, generalized - Background slow        IMPRESSION: This study is suggestive of mild diffuse encephalopathy, nonspecific etiology. No seizures or epileptiform discharges were seen throughout the recording. Lora Havens   CT Head Wo Contrast  Result Date: 06/03/2019 CLINICAL DATA:  Witnessed seizure. EXAM: CT HEAD WITHOUT CONTRAST TECHNIQUE: Contiguous axial images were obtained from the base of the skull through the vertex without intravenous contrast. COMPARISON:  September 10, 2014 FINDINGS: Brain: No evidence of acute infarction, hemorrhage, hydrocephalus, extra-axial collection or mass lesion/mass effect. 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 Chronic bilateral corona radiata microvascular ischemic changes. Interval chronic-appearing focal infarction in the right pons. Vascular: Mild calcified atherosclerosis of the bilateral carotid siphons. No hyperdense vessel sign. Skull: Intact cranium. Sinuses/Orbits: Chronic expansile thinning of the bilateral nasal bones and  paranasal sinuses with interval near-complete and worsened opacification throughout the paranasal sinuses, nasal airways, and with increased extension into the posterior nasopharyngeal airway which is at least partially obstructing. Normal bilateral orbital soft tissues. Other: Normal scalp. IMPRESSION: No acute intracranial trauma or skull fracture. Interval worsening of chronic bilateral rhino-paranasal sinus disease with interval worsened near-complete opacification of these airways and increased extension into the posterior nasopharyngeal airway which is at least partially obstructed. Nasal polyposis, amyloidosis, neoplasm or inspissated secretions or mucormycosis sinus disease are differential considerations. ENT consultation could be considered as clinically appropriate. CT paranasal sinuses without and with intravenous contrast could be considered. Chronic white matter microvascular ischemic changes and left peri insular ribbon lacunar infarction. Interval chronic-appearing focal infarction of the right pons. Electronically Signed   By: Revonda Humphrey   On: 06/03/2019 21:02   MR Brain W and Wo Contrast  Result Date: 06/04/2019 CLINICAL DATA:  Seizure. EXAM: MRI HEAD WITHOUT AND WITH CONTRAST TECHNIQUE: Multiplanar, multiecho pulse sequences of the brain and surrounding structures were obtained without and with intravenous contrast. CONTRAST:  53mL GADAVIST GADOBUTROL 1 MMOL/ML IV SOLN COMPARISON:  Head CT June 03, 2019 FINDINGS: Brain: No acute infarction, hemorrhage, hydrocephalus, extra-axial collection or mass lesion. Scattered foci of T2 hyperintensity are seen within the white matter of the cerebral hemispheres, nonspecific, most likely related to chronic small vessel ischemia. Remote infarct is seen on the right side of the pons. The temporal lobes are symmetric and within normal signal characteristics. Vascular: Normal flow voids. Skull and upper  cervical spine: There is diffuse decrease of the T1  signal within the visualized upper cervical spine with inversion of the cervical curvature and C4-5 fusion. Correlation with dedicated study suggested. Sinuses/Orbits: There is T1 hypointense/T2 hyperintense enhancing soft tissue occupy the paranasal sinuses, nasal cavity and nasopharynx with expansion of the nasal cavity and thinning of the clivus with no apparent intracranial or intraorbital extension. This appears similar to recent head CT and progress from CT of the paranasal sinuses performed February 09, 2019. IMPRESSION: 1. Soft tissue mass occupying the paranasal sinuses, nasopharynx and nasal cavity bilaterally likely representing sinonasal polyposis noting expansion of of the nasal cavity and progression since February 09, 2019. No intracranial or intraorbital extension identified. 2. Moderate chronic small vessel ischemic changes. 3. 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 4. Diffuse decrease of T1 signal within the visualized upper cervical spine with inversion of the cervical curvature and C4-5 fusion. Correlation with dedicated study suggested. Electronically Signed   By: Pedro Earls M.D.   On: 06/04/2019 09:37   Results/Tests Pending at Time of Discharge: None  Discharge Medications:  Allergies as of 06/04/2019   No Known Allergies     Medication List    TAKE these medications   fluticasone 50 MCG/ACT nasal spray Commonly known as: FLONASE Place 2 sprays into both nostrils daily.   hydrochlorothiazide 12.5 MG capsule Commonly known as: MICROZIDE Take 1 capsule (12.5 mg total) by mouth daily. Start taking on: June 05, 2019   levETIRAcetam 500 MG tablet Commonly known as: Keppra Take 1 tablet (500 mg total) by mouth 2 (two) times daily.   loratadine 10 MG tablet Commonly known as: Claritin Take 1 tablet (10 mg total) by mouth daily.   montelukast 10 MG tablet Commonly known as: SINGULAIR Take 1 tablet (10 mg total) by mouth at bedtime.    pantoprazole 20 MG tablet Commonly known as: PROTONIX Take 1 tablet (20 mg total) by mouth 2 (two) times daily.       Discharge Instructions: Please refer to Patient Instructions section of EMR for full details.  Patient was counseled important signs and symptoms that should prompt return to medical care, changes in medications, dietary instructions, activity restrictions, and follow up appointments.   Follow-Up Appointments: Follow-up Information    [facility] Go on 06/08/2019.   Why: at 9:30 am.  Please arrive 15 minutes before your appointment time. Contact information: [address]          Cleophas Dunker, DO 06/04/2019, 5:50 PM PGY-2, [place]

## 2019-06-04 NOTE — H&P (Addendum)
[facility] Admission History and Physical Service Pager: [phone]  Patient name: Thomas Kline Medical record number: [id number] Date of birth: August 19, 1950 Age: 69 y.o. Gender: male  Primary Care Provider: Nuala Alpha, DO Consultants: Neurology Code Status: DNI Preferred Emergency Contact: patient unsure  Chief Complaint: seizure-like activity  Assessment and Plan: Thomas Kline is a 69 y.o. male presenting with seizure-like activity. PMH is significant for HTN, GERD, nasal polyps, pancolonic diverticulosis, and seizures.  Seizure-like activity Patient has h/o seizures and had witnessed seizure-like activity while riding the bus today.  Per the ED provider's note, patient had what was described as full body convulsions for 2-3 minutes followed by 10 minutes of unresponsiveness. EMS arrived and brought him to [facility], but he is not sure how he got to the hospital and does not remember the event. He is at baseline level of cognition now, AOx4, but stated that he felt foggy and confused after arriving at the hospital but feels better now, consistent with a postictal state. Unclear if patient hit his head and per the history it appears that he lost consciousness. Did not bite tongue, did not lose bowel/bladder function. CT head obtained which shows no acute intracranial trauma or skull fracture. It also shows chronic white matter microvascular ischemic changes, left peri insular lacunar infarction, and interval chronic-appearing focal infarction of the right pons. Neurology was consulted once patient arrived. Patient received loading dose of Keppra 1000mg  in the ED, has not had any repeat seizure activity in the ED, also recommended MR Brain. Vital signs stable, although notable hypertension 172/99 (see below for management). On exam, no focal deficits, patient has returned to his baseline. Labs reveal mild leukocytosis to 11.1, consistent with demargination seen in  seizures. VBG WNL with mildly elevated acid-base deficit of 4, CMP WNL, and CBC WNL. UA with small hgb on dipstick, but on microscopy 0-5 RBC/HPF, otherwise normal. UDS negative, reports only rare alcohol intake (last drink at Christmas). Patient has a h/o seizures, first in 2012, second event in 2017, never been on medication for it, though. -Admit to [facility], attending Dr. Erin Hearing -Neurology consulted, appreciate recommendations -F/U MR brain -Up with assistance -Seizure precautions -PT/OT -Lovenox for DVT ppx -Regular diet -Keppra 500mg  BID - f/u EEG -f/u HIV -Vitals per routine  HTN Patient with history of HTN, at admission today BP is 172/99. No head ache or changes in vision. Patient is not currently on any medication. Previously he has been prescribed HCTZ and lisinopril, although the did not adhere to treatment. Patient tends to think that completing a prescription/bottle of medicine will make the problem go away. Will monitor BP during admission and likely start him on medication again prior to discharge. CT head found evidence of chronic vessel disease, likely due to uncontrolled HTN. -Continue to monitor -Consider restarting anti-hypertensive prior to discharge  GERD Patient with long history of epigastric fullness and heart burn. Home medication includes protonix 20 mg BID. Abdominal exam benign: soft, normal bowel sounds, no tenderness to palpation.  Previously referred to GI in the outpatient setting, but has not followed up. Recommend patient follow up as an outpatient. Patient denies vomiting, diarrhea, and constipation.  -Continue home medications  Chronic pansinusitis with obstructing nasal polyposis Patient with long history of sinus disease and obstructive nasal polyps on L side. Home medications include flonase, claritin daily, and singulair 10 mg daily. Patient referred to ENT and saw Dr. Erik Obey most recently on 05/25/19. Most recently patient given short  burst and taper of prednisone to use when fully obstructed. CT head obtained in the ED with interval worsening of chronic bilateral rhino-paranasal sinus disease with interval worsened near-complete opacification of these airways and increased extension into the posterior nasopharyngeal airway which is at least partially obstructed. DDX includes nasal polyposis, amyloidosis, neoplasm or inspissated secretions or mucormycosis sinus disease. Radiology recommends CT paranasal sinuses with and without IV contrast to further evaluate. -Continue home medications -Consider ENT consult while admitted vs outpatient follow up  FEN/GI: regular diet Prophylaxis: lovenox  Disposition: admit to med surg pending seizure work up, likely home w/ outpatient follow up when work up complete  History of Present Illness: Thomas Kline is a 69 y.o. male presenting with witnessed seizure-like activity while riding the bus today. Patient has a h/o seizurs and today had what was described to ED provider as GTC-like movement for 2-3 minutes followed by 10 minutes of unresponsiveness. He is not sure how he got to the hospital, does not remember the event. He is at baseline level of cognition now, AOx4, but stated that he felt foggy and confused after arriving the hospital and feels better now. Patient had a normal day yesterday, including eating and drinking his usual amount, nothing out of the ordinary. He does not smoke cigarettes or use drugs, and only drinks alcohol at holidays, the last time being at Christmas. His first seizure was in 2012, he had a second one in 2017, and third event was today.   Patient says he has no close family or friends and is not sure who his emergency contact would be. He will continue to consider it  Review Of Systems: Per HPI with the following additions:   Review of Systems  Constitutional: Negative for chills and fever.  HENT: Positive for congestion. Negative for nosebleeds and sinus pain.    Eyes: Negative for blurred vision and double vision.  Respiratory: Negative for cough and shortness of breath.   Cardiovascular: Negative for chest pain and leg swelling.  Gastrointestinal: Positive for heartburn. Negative for abdominal pain, constipation, diarrhea, nausea and vomiting.       Bloating   Genitourinary: Negative for dysuria and hematuria.  Skin: Negative for rash.  Neurological: Negative for dizziness, focal weakness and headaches.  Psychiatric/Behavioral: Positive for memory loss. Negative for depression. The patient is not nervous/anxious.     Patient Active Problem List   Diagnosis Date Noted  . Pancolonic diverticulosis 11/16/2013  . Abdominal bloating 11/15/2013  . GERD (gastroesophageal reflux disease) 03/22/2012  . Lightheadedness 12/09/2011  . Multiple nasal polyps 12/09/2011  . Hypertension 12/09/2011  . Acute blood loss anemia 05/03/2011  . Acute lower GI bleeding 05/03/2011  . Seizure ([place]) 05/03/2011    Past Medical History: Past Medical History:  Diagnosis Date  . Intestinal polyps    colonoscopy from 05/06/11: multiple polyps and internal and external hemorrhoids. along with pan diverticulosis  . Pancolonic diverticulosis 05/06/11   diagnosed on colonoscopy from 04/2011 along with multiple polyps and internal and external hemorrhoids.   . Seizures ([place])     Past Surgical History: Past Surgical History:  Procedure Laterality Date  . COLONOSCOPY  05/06/2011   Procedure: COLONOSCOPY;  Surgeon: Beryle Beams, MD;  Location: [place];  Service: Endoscopy;  Laterality: N/A;    Social History: Social History   Tobacco Use  . Smoking status: Never Smoker  . Smokeless tobacco: Never Used  Substance Use Topics  . Alcohol use: Yes    Alcohol/week: 1.0  standard drinks    Types: 1 Cans of beer per week  . Drug use: No   Additional social history: patient lives alone, no close friends or family. Not sure who his emergency contact would  be. Please also refer to relevant sections of EMR.  Family History: No family history on file.  Allergies and Medications: No Known Allergies No current facility-administered medications on file prior to encounter.   Current Outpatient Medications on File Prior to Encounter  Medication Sig Dispense Refill  . fluticasone (FLONASE) 50 MCG/ACT nasal spray Place 2 sprays into both nostrils daily. (Patient not taking: Reported on 06/04/2019) 16 g 2  . loratadine (CLARITIN) 10 MG tablet Take 1 tablet (10 mg total) by mouth daily. (Patient not taking: Reported on 06/04/2019) 30 tablet 11  . montelukast (SINGULAIR) 10 MG tablet Take 1 tablet (10 mg total) by mouth at bedtime. (Patient not taking: Reported on 06/04/2019) 30 tablet 11  . pantoprazole (PROTONIX) 20 MG tablet Take 1 tablet (20 mg total) by mouth 2 (two) times daily. (Patient not taking: Reported on 06/04/2019) 60 tablet 2    Objective: BP (!) 141/79 (BP Location: Right Arm)   Pulse 72   Resp 18   Ht 6' (1.829 m)   Wt 108 kg   SpO2 96%   BMI 32.29 kg/m  Exam: General: older African man, overweight, resting comfortably in bed, NAD Eyes: anicteric sclerae ENTM: large obstructing polyp in L nare Neck: supple Cardiovascular: RRR, no m/r/g Respiratory: CTAB, no increased WOB, no wheezing, rhonchi, or rales Gastrointestinal: soft, NT, ND, normal bowel sounds MSK: full range of motion, no edema Derm: warm, dry, no lesions or rashes Neuro:  Cranial Nerves: II: PERRL.  III,IV, VI: EOMI without ptosis or diplopia.  V: Facial sensation is symmetric to touch VII: Facial movement is symmetric.  VIII: hearing is intact to voice X: Palate elevates symmetrically XI: Shoulder shrug is symmetric. XII: tongue is midline without atrophy or fasciculations.  Motor: Tone is normal. Bulk is normal. 5/5 strength was present in all four extremities.  Psych: normal mood, full affect  Labs and Imaging: CBC BMET  Recent Labs  Lab  06/03/19 1933 06/03/19 1933 06/03/19 1953  WBC 11.1*  --   --   HGB 12.3*   < > 12.9*  HCT 39.5   < > 38.0*  PLT 329  --   --    < > = values in this interval not displayed.   Recent Labs  Lab 06/03/19 1933 06/03/19 1933 06/03/19 1953  NA 139   < > 141  K 4.0   < > 3.9  CL 104  --   --   CO2 23  --   --   BUN 11  --   --   CREATININE 1.10  --   --   GLUCOSE 190*  --   --   CALCIUM 8.8*  --   --    < > = values in this interval not displayed.     EKG: EKG Interpretation  Date/Time:  Thursday June 03 2019 19:32:00 EDT Ventricular Rate:  106 PR Interval:    QRS Duration: 92 QT Interval:  363 QTC Calculation: 482 R Axis:   -17 Text Interpretation: Sinus tachycardia Abnormal R-wave progression, early transition LVH by voltage Borderline prolonged QT interval Confirmed by Quintella Reichert [phone]) on 06/03/2019 8:41:35 PM  CT Head Wo Contrast  Result Date: 06/03/2019 CLINICAL DATA:  Witnessed seizure. EXAM: CT HEAD WITHOUT CONTRAST  TECHNIQUE: Contiguous axial images were obtained from the base of the skull through the vertex without intravenous contrast. COMPARISON:  September 10, 2014 FINDINGS: Brain: No evidence of acute infarction, hemorrhage, hydrocephalus, extra-axial collection or mass lesion/mass effect. 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 Chronic bilateral corona radiata microvascular ischemic changes. Interval chronic-appearing focal infarction in the right pons. Vascular: Mild calcified atherosclerosis of the bilateral carotid siphons. No hyperdense vessel sign. Skull: Intact cranium. Sinuses/Orbits: Chronic expansile thinning of the bilateral nasal bones and paranasal sinuses with interval near-complete and worsened opacification throughout the paranasal sinuses, nasal airways, and with increased extension into the posterior nasopharyngeal airway which is at least partially obstructing. Normal bilateral orbital soft tissues. Other: Normal scalp.  IMPRESSION: No acute intracranial trauma or skull fracture. Interval worsening of chronic bilateral rhino-paranasal sinus disease with interval worsened near-complete opacification of these airways and increased extension into the posterior nasopharyngeal airway which is at least partially obstructed. Nasal polyposis, amyloidosis, neoplasm or inspissated secretions or mucormycosis sinus disease are differential considerations. ENT consultation could be considered as clinically appropriate. CT paranasal sinuses without and with intravenous contrast could be considered. Chronic white matter microvascular ischemic changes and left peri insular ribbon lacunar infarction. Interval chronic-appearing focal infarction of the right pons. Electronically Signed   By: Revonda Humphrey   On: 06/03/2019 21:02   Daisy Floro, DO 06/04/2019, 5:31 AM PGY-1, [place] Intern pager: [phone], text pages welcome   FPTS Upper-Level Resident Addendum   I have independently interviewed and examined the patient. I have discussed the above with the original author and agree with their documentation. My edits for correction/addition/clarification are in blue. Please see also any attending notes.    Milus Banister, DO PGY-2, [facility] 06/04/2019 5:31 AM  FPTS Service pager: [phone] (text pages welcome through [facility])

## 2019-06-04 NOTE — Progress Notes (Signed)
EEG complete - results pending 

## 2019-06-08 ENCOUNTER — Inpatient Hospital Stay: Payer: Medicare Other | Admitting: Family Medicine

## 2019-07-03 ENCOUNTER — Other Ambulatory Visit: Payer: Self-pay | Admitting: Family Medicine

## 2019-08-01 ENCOUNTER — Emergency Department (HOSPITAL_COMMUNITY): Payer: Medicare Other

## 2019-08-01 ENCOUNTER — Inpatient Hospital Stay (HOSPITAL_COMMUNITY)
Admission: EM | Admit: 2019-08-01 | Discharge: 2019-08-06 | DRG: 982 | Disposition: A | Discharge status: Skilled Nursing Facility | Payer: Medicare Other | Attending: Family Medicine | Admitting: Family Medicine

## 2019-08-01 DIAGNOSIS — J330 Polyp of nasal cavity: Secondary | ICD-10-CM

## 2019-08-01 DIAGNOSIS — R569 Unspecified convulsions: Secondary | ICD-10-CM

## 2019-08-01 DIAGNOSIS — E1165 Type 2 diabetes mellitus with hyperglycemia: Secondary | ICD-10-CM | POA: Diagnosis present

## 2019-08-01 DIAGNOSIS — G40909 Epilepsy, unspecified, not intractable, without status epilepticus: Secondary | ICD-10-CM | POA: Diagnosis not present

## 2019-08-01 DIAGNOSIS — R260 Ataxic gait: Secondary | ICD-10-CM | POA: Diagnosis present

## 2019-08-01 DIAGNOSIS — D62 Acute posthemorrhagic anemia: Secondary | ICD-10-CM | POA: Diagnosis not present

## 2019-08-01 DIAGNOSIS — Z87891 Personal history of nicotine dependence: Secondary | ICD-10-CM

## 2019-08-01 DIAGNOSIS — I10 Essential (primary) hypertension: Secondary | ICD-10-CM

## 2019-08-01 DIAGNOSIS — K5730 Diverticulosis of large intestine without perforation or abscess without bleeding: Secondary | ICD-10-CM | POA: Diagnosis present

## 2019-08-01 DIAGNOSIS — J339 Nasal polyp, unspecified: Secondary | ICD-10-CM | POA: Diagnosis present

## 2019-08-01 DIAGNOSIS — K219 Gastro-esophageal reflux disease without esophagitis: Secondary | ICD-10-CM | POA: Diagnosis present

## 2019-08-01 DIAGNOSIS — Z79899 Other long term (current) drug therapy: Secondary | ICD-10-CM

## 2019-08-01 DIAGNOSIS — J324 Chronic pansinusitis: Secondary | ICD-10-CM | POA: Diagnosis present

## 2019-08-01 DIAGNOSIS — Z20822 Contact with and (suspected) exposure to covid-19: Secondary | ICD-10-CM | POA: Diagnosis present

## 2019-08-01 HISTORY — DX: Essential (primary) hypertension: I10

## 2019-08-01 HISTORY — DX: Gastro-esophageal reflux disease without esophagitis: K21.9

## 2019-08-01 LAB — URINALYSIS, ROUTINE W REFLEX MICROSCOPIC
Bacteria, UA: NONE SEEN
Bilirubin Urine: NEGATIVE
Glucose, UA: 50 mg/dL — AB
Hgb urine dipstick: NEGATIVE
Ketones, ur: NEGATIVE mg/dL
Leukocytes,Ua: NEGATIVE
Nitrite: NEGATIVE
Protein, ur: 30 mg/dL — AB
Specific Gravity, Urine: 1.014 (ref 1.005–1.030)
pH: 5 (ref 5.0–8.0)

## 2019-08-01 LAB — COMPREHENSIVE METABOLIC PANEL
ALT: 15 U/L (ref 0–44)
AST: 24 U/L (ref 15–41)
Albumin: 3.6 g/dL (ref 3.5–5.0)
Alkaline Phosphatase: 106 U/L (ref 38–126)
Anion gap: 19 — ABNORMAL HIGH (ref 5–15)
BUN: 11 mg/dL (ref 8–23)
CO2: 16 mmol/L — ABNORMAL LOW (ref 22–32)
Calcium: 8.8 mg/dL — ABNORMAL LOW (ref 8.9–10.3)
Chloride: 104 mmol/L (ref 98–111)
Creatinine, Ser: 1.26 mg/dL — ABNORMAL HIGH (ref 0.61–1.24)
GFR calc Af Amer: >60 mL/min (ref >60)
GFR calc non Af Amer: 57 mL/min — ABNORMAL LOW (ref >60)
Glucose, Bld: 179 mg/dL — ABNORMAL HIGH (ref 70–99)
Potassium: 4 mmol/L (ref 3.5–5.1)
Sodium: 139 mmol/L (ref 135–145)
Total Bilirubin: 0.4 mg/dL (ref 0.3–1.2)
Total Protein: 7.1 g/dL (ref 6.5–8.1)

## 2019-08-01 LAB — CBC WITH DIFFERENTIAL/PLATELET
Abs Immature Granulocytes: 0.06 10*3/uL (ref 0.00–0.07)
Basophils Absolute: 0.1 10*3/uL (ref 0.0–0.1)
Basophils Relative: 1 %
Eosinophils Absolute: 1 10*3/uL — ABNORMAL HIGH (ref 0.0–0.5)
Eosinophils Relative: 9 %
HCT: 39.4 % (ref 39.0–52.0)
Hemoglobin: 12.3 g/dL — ABNORMAL LOW (ref 13.0–17.0)
Immature Granulocytes: 1 %
Lymphocytes Relative: 42 %
Lymphs Abs: 4.9 10*3/uL — ABNORMAL HIGH (ref 0.7–4.0)
MCH: 28.5 pg (ref 26.0–34.0)
MCHC: 31.2 g/dL (ref 30.0–36.0)
MCV: 91.2 fL (ref 80.0–100.0)
Monocytes Absolute: 1.1 10*3/uL — ABNORMAL HIGH (ref 0.1–1.0)
Monocytes Relative: 10 %
Neutro Abs: 4 10*3/uL (ref 1.7–7.7)
Neutrophils Relative %: 37 %
Platelets: 464 10*3/uL — ABNORMAL HIGH (ref 150–400)
RBC: 4.32 MIL/uL (ref 4.22–5.81)
RDW: 13.4 % (ref 11.5–15.5)
WBC: 11 10*3/uL — ABNORMAL HIGH (ref 4.0–10.5)
nRBC: 0 % (ref 0.0–0.2)

## 2019-08-01 LAB — RAPID URINE DRUG SCREEN, HOSP PERFORMED
Amphetamines: NOT DETECTED
Barbiturates: NOT DETECTED
Benzodiazepines: NOT DETECTED
Cocaine: NOT DETECTED
Opiates: NOT DETECTED
Tetrahydrocannabinol: NOT DETECTED

## 2019-08-01 LAB — MAGNESIUM: Magnesium: 2.2 mg/dL (ref 1.7–2.4)

## 2019-08-01 LAB — CBG MONITORING, ED: Glucose-Capillary: 149 mg/dL — ABNORMAL HIGH (ref 70–99)

## 2019-08-01 LAB — ETHANOL: Alcohol, Ethyl (B): <10 mg/dL (ref <10)

## 2019-08-01 MED ORDER — LORAZEPAM 2 MG/ML IJ SOLN
1.0000 mg | Freq: Once | INTRAMUSCULAR | Status: AC
  Administered 2019-08-01: 1 mg via INTRAVENOUS
  Filled 2019-08-01: qty 1

## 2019-08-01 MED ORDER — LEVETIRACETAM IN NACL 1000 MG/100ML IV SOLN
1000.0000 mg | Freq: Once | INTRAVENOUS | Status: AC
  Administered 2019-08-01: 1000 mg via INTRAVENOUS
  Filled 2019-08-01: qty 100

## 2019-08-01 MED ORDER — SODIUM CHLORIDE 0.9 % IV BOLUS
1000.0000 mL | Freq: Once | INTRAVENOUS | Status: AC
  Administered 2019-08-01: 1000 mL via INTRAVENOUS

## 2019-08-01 NOTE — ED Provider Notes (Signed)
[facility] EMERGENCY DEPARTMENT Provider Note   CSN: [id number] Arrival date & time: 08/01/19  1856     History Chief Complaint  Patient presents with  . Seizures    Thomas Kline is a 69 y.o. male.  The history is provided by the patient, the EMS personnel and medical records. No language interpreter was used.  Seizures  Thomas Kline is a 69 y.o. male who presents to the Emergency Department complaining of seizure. Level V caveat due to altered mental status. He presents the emergency department by EMS after witnessed seizure while he was on the bus. He fell off his seat and had about five minutes of seizure like activity per bystanders. He continued to be unresponsive for about 15 minutes following the seizure. He began to wake up on arrival to the emergency department but continues to be confused. Medical history is unknown.    No past medical history on file. HTN, seizure disorder  There are no problems to display for this patient.        No family history on file.  Social History   Tobacco Use  . Smoking status: Not on file  Substance Use Topics  . Alcohol use: Not on file  . Drug use: Not on file    Home Medications Prior to Admission medications   Not on File    Allergies    Patient has no allergy information on record.  Review of Systems   Review of Systems  Neurological: Positive for seizures.  All other systems reviewed and are negative.   Physical Exam Updated Vital Signs BP (!) 179/92   Pulse 93   Temp 98.5 F (36.9 C) (Oral)   Resp 17   SpO2 100%   Physical Exam Vitals and nursing note reviewed.  Constitutional:      Appearance: He is well-developed.  HENT:     Head: Normocephalic.     Comments: Contusion to right forehead was mild local swelling Cardiovascular:     Rate and Rhythm: Regular rhythm. Tachycardia present.     Heart sounds: No murmur.  Pulmonary:     Effort: No respiratory distress.     Breath  sounds: Normal breath sounds.     Comments: Tachypnea Abdominal:     Palpations: Abdomen is soft.     Tenderness: There is no abdominal tenderness. There is no guarding or rebound.  Musculoskeletal:        General: No tenderness.     Cervical back: Neck supple.  Skin:    General: Skin is warm and dry.  Neurological:     Mental Status: He is alert.     Comments: Confused. Follows commands. Unintelligible speech. Moves all extremities symmetrically.  Psychiatric:     Comments: Mildly agitated but redirect double     ED Results / Procedures / Treatments   Labs (all labs ordered are listed, but only abnormal results are displayed) Labs Reviewed  COMPREHENSIVE METABOLIC PANEL - Abnormal; Notable for the following components:      Result Value   CO2 16 (*)    Glucose, Bld 179 (*)    Creatinine, Ser 1.26 (*)    Calcium 8.8 (*)    GFR calc non Af Amer 57 (*)    Anion gap 19 (*)    All other components within normal limits  URINALYSIS, ROUTINE W REFLEX MICROSCOPIC - Abnormal; Notable for the following components:   Color, Urine STRAW (*)    Glucose, UA 50 (*)  Protein, ur 30 (*)    All other components within normal limits  CBC WITH DIFFERENTIAL/PLATELET - Abnormal; Notable for the following components:   WBC 11.0 (*)    Hemoglobin 12.3 (*)    Platelets 464 (*)    Lymphs Abs 4.9 (*)    Monocytes Absolute 1.1 (*)    Eosinophils Absolute 1.0 (*)    All other components within normal limits  CBG MONITORING, ED - Abnormal; Notable for the following components:   Glucose-Capillary 149 (*)    All other components within normal limits  SARS CORONAVIRUS 2 BY RT PCR (HOSPITAL ORDER, [facility])  ETHANOL  RAPID URINE DRUG SCREEN, HOSP PERFORMED  MAGNESIUM  LEVETIRACETAM LEVEL    EKG None  Radiology CT Head Wo Contrast  Result Date: 08/01/2019 CLINICAL DATA:  Seizure EXAM: CT HEAD WITHOUT CONTRAST TECHNIQUE: Contiguous axial images were obtained  from the base of the skull through the vertex without intravenous contrast. COMPARISON:  None. FINDINGS: Brain: No evidence of acute territorial infarction, hemorrhage, hydrocephalus,extra-axial collection or mass lesion/mass effect. There is dilatation the ventricles and sulci consistent with age-related atrophy. Low-attenuation changes in the deep white matter consistent with small vessel ischemia. Vascular: No hyperdense vessel or unexpected calcification. Skull: The skull is intact. No fracture or focal lesion identified. Sinuses/Orbits: There is near opacification seen throughout all the paranasal sinuses with remodeling of the ethmoid air cells. The mastoid air cells appear to be intact. Other: None Cervical spine: Alignment: There is reversal of the normal cervical lordosis. Skull base and vertebrae: Visualized skull base is intact. No atlanto-occipital dissociation. The patient has had prior partial laminectomy at C3 through C6. There is interbody fusion from C4 through C6. Soft tissues and spinal canal: The visualized paraspinal soft tissues are unremarkable. No prevertebral soft tissue swelling is seen. The spinal canal is grossly unremarkable, no large epidural collection or significant canal narrowing. Disc levels: Multilevel cervical spine spondylosis is seen with disc osteophyte complex and uncovertebral osteophytes most notable at C3-C4 with moderate neural foraminal narrowing Upper chest: Biapical subpleural bleb formation is seen. Thoracic inlet is within normal limits. Other: None IMPRESSION: No acute intracranial abnormality. Findings consistent with age related atrophy and chronic small vessel ischemia Complete opacification of the paranasal sinuses with chronic remodeling No acute fracture or malalignment of the spine. Prior partial decompression and interbody fusion from C3 through C6 Electronically Signed   By: Prudencio Pair M.D.   On: 08/01/2019 20:08   CT Cervical Spine Wo  Contrast  Result Date: 08/01/2019 CLINICAL DATA:  Seizure EXAM: CT HEAD WITHOUT CONTRAST TECHNIQUE: Contiguous axial images were obtained from the base of the skull through the vertex without intravenous contrast. COMPARISON:  None. FINDINGS: Brain: No evidence of acute territorial infarction, hemorrhage, hydrocephalus,extra-axial collection or mass lesion/mass effect. There is dilatation the ventricles and sulci consistent with age-related atrophy. Low-attenuation changes in the deep white matter consistent with small vessel ischemia. Vascular: No hyperdense vessel or unexpected calcification. Skull: The skull is intact. No fracture or focal lesion identified. Sinuses/Orbits: There is near opacification seen throughout all the paranasal sinuses with remodeling of the ethmoid air cells. The mastoid air cells appear to be intact. Other: None Cervical spine: Alignment: There is reversal of the normal cervical lordosis. Skull base and vertebrae: Visualized skull base is intact. No atlanto-occipital dissociation. The patient has had prior partial laminectomy at C3 through C6. There is interbody fusion from C4 through C6. Soft tissues and spinal canal:  The visualized paraspinal soft tissues are unremarkable. No prevertebral soft tissue swelling is seen. The spinal canal is grossly unremarkable, no large epidural collection or significant canal narrowing. Disc levels: Multilevel cervical spine spondylosis is seen with disc osteophyte complex and uncovertebral osteophytes most notable at C3-C4 with moderate neural foraminal narrowing Upper chest: Biapical subpleural bleb formation is seen. Thoracic inlet is within normal limits. Other: None IMPRESSION: No acute intracranial abnormality. Findings consistent with age related atrophy and chronic small vessel ischemia Complete opacification of the paranasal sinuses with chronic remodeling No acute fracture or malalignment of the spine. Prior partial decompression and  interbody fusion from C3 through C6 Electronically Signed   By: Prudencio Pair M.D.   On: 08/01/2019 20:08    Procedures Procedures (including critical care time)  Medications Ordered in ED Medications  levETIRAcetam (KEPPRA) IVPB 1000 mg/100 mL premix (has no administration in time range)  sodium chloride 0.9 % bolus 1,000 mL (0 mLs Intravenous Stopped 08/01/19 2217)  LORazepam (ATIVAN) injection 1 mg (1 mg Intravenous Given 08/01/19 1923)    ED Course  I have reviewed the triage vital signs and the nursing notes.  Pertinent labs & imaging results that were available during my care of the patient were reviewed by me and considered in my medical decision making (see chart for details).    MDM Rules/Calculators/A&P                     Patient here for evaluation but nonfocal on initial evaluation. After patient had been in the department for over an hour his identity was found and on record review he was found to have a history of seizure disorder. He was observed for several hours with improvement in his mental status but he continued to have ataxia on attempting to stand as well as difficult to understand speech. He has no relatives to contact and note one that assist him with his ADLs and medical needs. Given his persistent postictal state medicine consulted for admission for further evaluation and observation.  Final Clinical Impression(s) / ED Diagnoses Final diagnoses:  Seizure [facility])    Rx / [place] Orders ED Discharge Orders    None       Quintella Reichert, MD 08/01/19 2356

## 2019-08-01 NOTE — ED Notes (Signed)
Pt transported to CT ?

## 2019-08-01 NOTE — ED Notes (Signed)
Pt speech is still incomprehensible, but pt was able to write his name and DOB.

## 2019-08-01 NOTE — ED Notes (Signed)
Pt ambulated in hallway with assistance. Pt gait noted to be very unsteady and staggered. RN had to max assist  Pt. Pt speech is still incomprehensible. Dr. Ralene Bathe notified.

## 2019-08-01 NOTE — ED Triage Notes (Signed)
Pt here via EMS after witnessed seizure on the bus. Pt fell off his seat and had about five minutes of seizure like activity per bystanders. Pt unresponsive for about fifteen minutes post seizure. Waking on arrival to ED, appears post-ictal. Still nonverbal, making purposeful movement but not following commands. Unknown seizure history.

## 2019-08-02 ENCOUNTER — Inpatient Hospital Stay (HOSPITAL_COMMUNITY): Payer: Medicare Other

## 2019-08-02 DIAGNOSIS — K5730 Diverticulosis of large intestine without perforation or abscess without bleeding: Secondary | ICD-10-CM | POA: Diagnosis present

## 2019-08-02 DIAGNOSIS — Z79899 Other long term (current) drug therapy: Secondary | ICD-10-CM | POA: Diagnosis not present

## 2019-08-02 DIAGNOSIS — J330 Polyp of nasal cavity: Secondary | ICD-10-CM

## 2019-08-02 DIAGNOSIS — J324 Chronic pansinusitis: Secondary | ICD-10-CM | POA: Diagnosis present

## 2019-08-02 DIAGNOSIS — I10 Essential (primary) hypertension: Secondary | ICD-10-CM | POA: Diagnosis present

## 2019-08-02 DIAGNOSIS — J339 Nasal polyp, unspecified: Secondary | ICD-10-CM | POA: Diagnosis present

## 2019-08-02 DIAGNOSIS — G40909 Epilepsy, unspecified, not intractable, without status epilepticus: Secondary | ICD-10-CM | POA: Diagnosis present

## 2019-08-02 DIAGNOSIS — Z20822 Contact with and (suspected) exposure to covid-19: Secondary | ICD-10-CM | POA: Diagnosis present

## 2019-08-02 DIAGNOSIS — R404 Transient alteration of awareness: Secondary | ICD-10-CM

## 2019-08-02 DIAGNOSIS — Z87891 Personal history of nicotine dependence: Secondary | ICD-10-CM | POA: Diagnosis not present

## 2019-08-02 DIAGNOSIS — R569 Unspecified convulsions: Secondary | ICD-10-CM | POA: Diagnosis present

## 2019-08-02 DIAGNOSIS — R260 Ataxic gait: Secondary | ICD-10-CM | POA: Diagnosis present

## 2019-08-02 DIAGNOSIS — K219 Gastro-esophageal reflux disease without esophagitis: Secondary | ICD-10-CM | POA: Diagnosis present

## 2019-08-02 DIAGNOSIS — D62 Acute posthemorrhagic anemia: Secondary | ICD-10-CM | POA: Diagnosis not present

## 2019-08-02 DIAGNOSIS — E1165 Type 2 diabetes mellitus with hyperglycemia: Secondary | ICD-10-CM | POA: Diagnosis present

## 2019-08-02 LAB — CBC
HCT: 37.7 % — ABNORMAL LOW (ref 39.0–52.0)
Hemoglobin: 11.8 g/dL — ABNORMAL LOW (ref 13.0–17.0)
MCH: 28 pg (ref 26.0–34.0)
MCHC: 31.3 g/dL (ref 30.0–36.0)
MCV: 89.5 fL (ref 80.0–100.0)
Platelets: 393 10*3/uL (ref 150–400)
RBC: 4.21 MIL/uL — ABNORMAL LOW (ref 4.22–5.81)
RDW: 13.7 % (ref 11.5–15.5)
WBC: 11.5 10*3/uL — ABNORMAL HIGH (ref 4.0–10.5)
nRBC: 0 % (ref 0.0–0.2)

## 2019-08-02 LAB — HEMOGLOBIN A1C
Hgb A1c MFr Bld: 6.9 % — ABNORMAL HIGH (ref 4.8–5.6)
Mean Plasma Glucose: 151 mg/dL

## 2019-08-02 LAB — BASIC METABOLIC PANEL
Anion gap: 7 (ref 5–15)
BUN: 9 mg/dL (ref 8–23)
CO2: 24 mmol/L (ref 22–32)
Calcium: 8.7 mg/dL — ABNORMAL LOW (ref 8.9–10.3)
Chloride: 109 mmol/L (ref 98–111)
Creatinine, Ser: 0.91 mg/dL (ref 0.61–1.24)
GFR calc Af Amer: >60 mL/min (ref >60)
GFR calc non Af Amer: >60 mL/min (ref >60)
Glucose, Bld: 113 mg/dL — ABNORMAL HIGH (ref 70–99)
Potassium: 3.7 mmol/L (ref 3.5–5.1)
Sodium: 140 mmol/L (ref 135–145)

## 2019-08-02 LAB — GLUCOSE, CAPILLARY
Glucose-Capillary: 102 mg/dL — ABNORMAL HIGH (ref 70–99)
Glucose-Capillary: 167 mg/dL — ABNORMAL HIGH (ref 70–99)
Glucose-Capillary: 132 mg/dL — ABNORMAL HIGH (ref 70–99)
Glucose-Capillary: 117 mg/dL — ABNORMAL HIGH (ref 70–99)

## 2019-08-02 LAB — SARS CORONAVIRUS 2 BY RT PCR (HOSPITAL ORDER, PERFORMED IN ~~LOC~~ HOSPITAL LAB): SARS Coronavirus 2: NEGATIVE

## 2019-08-02 MED ORDER — SALINE SPRAY 0.65 % NA SOLN: 1.0000 | NASAL | Status: DC | PRN

## 2019-08-02 MED ORDER — ACETAMINOPHEN 325 MG PO TABS: 650.0000 mg | ORAL_TABLET | Freq: Four times a day (QID) | ORAL | Status: DC | PRN

## 2019-08-02 MED ORDER — HYDROCHLOROTHIAZIDE 25 MG PO TABS
25.0000 mg | ORAL_TABLET | Freq: Every day | ORAL | Status: DC
  Administered 2019-08-02 – 2019-08-06 (×4): 25 mg via ORAL
  Filled 2019-08-02 (×4): qty 1

## 2019-08-02 MED ORDER — LEVETIRACETAM 500 MG PO TABS
500.0000 mg | ORAL_TABLET | Freq: Two times a day (BID) | ORAL | Status: DC
  Administered 2019-08-02 – 2019-08-06 (×9): 500 mg via ORAL
  Filled 2019-08-02 (×9): qty 1

## 2019-08-02 MED ORDER — HYDROCHLOROTHIAZIDE 12.5 MG PO CAPS: 12.5000 mg | ORAL_CAPSULE | Freq: Every day | ORAL | Status: DC

## 2019-08-02 MED ORDER — ACETAMINOPHEN 650 MG RE SUPP: 650.0000 mg | Freq: Four times a day (QID) | RECTAL | Status: DC | PRN

## 2019-08-02 MED ORDER — ENOXAPARIN SODIUM 40 MG/0.4ML ~~LOC~~ SOLN
40.0000 mg | SUBCUTANEOUS | Status: DC
  Administered 2019-08-02: 40 mg via SUBCUTANEOUS
  Filled 2019-08-02: qty 0.4

## 2019-08-02 MED ORDER — INSULIN ASPART 100 UNIT/ML ~~LOC~~ SOLN
0.0000 [IU] | Freq: Three times a day (TID) | SUBCUTANEOUS | Status: DC
  Administered 2019-08-02 – 2019-08-04 (×2): 2 [IU] via SUBCUTANEOUS
  Administered 2019-08-04: 1 [IU] via SUBCUTANEOUS

## 2019-08-02 NOTE — ED Notes (Signed)
Breakfast ordered 

## 2019-08-02 NOTE — ED Notes (Signed)
First attempt to call report unsuccessful. 

## 2019-08-02 NOTE — Consult Note (Signed)
Reason for Consult: Nasal polyps Referring Physician: Family Medicine  Thomas Kline is an 69 y.o. male.  HPI: 69 year old male with a long history of sinus problems and nasal polyps previously treated by Jodi Marble.  It seems that surgery has been recommended in the past but has not occurred.  He was last seen in our office two months ago.  He was admitted to the hospital due to increasing seizure activity and consultation was requested to consider whether the sinus problem could be contributing to the increased seizure activity.  He is being treated for his seizure disorder.  He complains of nasal obstruction in both sides.    No past medical history on file.    No family history on file.  Social History:  has no history on file for tobacco, alcohol, and drug.  Allergies: Not on File  Medications: I have reviewed the patient's current medications.  Results for orders placed or performed during the hospital encounter of 08/01/19 (from the past 48 hour(s))  CBG monitoring, ED     Status: Abnormal   Collection Time: 08/01/19  7:02 PM  Result Value Ref Range   Glucose-Capillary 149 (H) 70 - 99 mg/dL    Comment: Glucose reference range applies only to samples taken after fasting for at least 8 hours.   Comment 1 Notify RN    Comment 2 Document in Chart   Comprehensive metabolic panel     Status: Abnormal   Collection Time: 08/01/19  7:13 PM  Result Value Ref Range   Sodium 139 135 - 145 mmol/L   Potassium 4.0 3.5 - 5.1 mmol/L   Chloride 104 98 - 111 mmol/L   CO2 16 (L) 22 - 32 mmol/L   Glucose, Bld 179 (H) 70 - 99 mg/dL    Comment: Glucose reference range applies only to samples taken after fasting for at least 8 hours.   BUN 11 8 - 23 mg/dL   Creatinine, Ser 1.26 (H) 0.61 - 1.24 mg/dL   Calcium 8.8 (L) 8.9 - 10.3 mg/dL   Total Protein 7.1 6.5 - 8.1 g/dL   Albumin 3.6 3.5 - 5.0 g/dL   AST 24 15 - 41 U/L   ALT 15 0 - 44 U/L   Alkaline Phosphatase 106 38 - 126 U/L   Total  Bilirubin 0.4 0.3 - 1.2 mg/dL   GFR calc non Af Amer 57 (L) >60 mL/min   GFR calc Af Amer >60 >60 mL/min   Anion gap 19 (H) 5 - 15    Comment: Performed at [facility] [address], [place], [place] [address]  Ethanol     Status: Thomas Kline   Collection Time: 08/01/19  7:13 PM  Result Value Ref Range   Alcohol, Ethyl (B) <10 <10 mg/dL    Comment: (NOTE) Lowest detectable limit for serum alcohol is 10 mg/dL. For medical purposes only. Performed at [facility], [place] [address], [place], [place] [address]   CBC with Differential     Status: Abnormal   Collection Time: 08/01/19  7:13 PM  Result Value Ref Range   WBC 11.0 (H) 4.0 - 10.5 K/uL   RBC 4.32 4.22 - 5.81 MIL/uL   Hemoglobin 12.3 (L) 13.0 - 17.0 g/dL   HCT 39.4 39.0 - 52.0 %   MCV 91.2 80.0 - 100.0 fL   MCH 28.5 26.0 - 34.0 pg   MCHC 31.2 30.0 - 36.0 g/dL   RDW 13.4 11.5 - 15.5 %  Platelets 464 (H) 150 - 400 K/uL   nRBC 0.0 0.0 - 0.2 %   Neutrophils Relative % 37 %   Neutro Abs 4.0 1.7 - 7.7 K/uL   Lymphocytes Relative 42 %   Lymphs Abs 4.9 (H) 0.7 - 4.0 K/uL   Monocytes Relative 10 %   Monocytes Absolute 1.1 (H) 0.1 - 1.0 K/uL   Eosinophils Relative 9 %   Eosinophils Absolute 1.0 (H) 0.0 - 0.5 K/uL   Basophils Relative 1 %   Basophils Absolute 0.1 0.0 - 0.1 K/uL   Immature Granulocytes 1 %   Abs Immature Granulocytes 0.06 0.00 - 0.07 K/uL    Comment: Performed at [facility] [address] [place], [place] [address]  Magnesium     Status: Thomas Kline   Collection Time: 08/01/19  7:13 PM  Result Value Ref Range   Magnesium 2.2 1.7 - 2.4 mg/dL    Comment: Performed at [facility] [address] [place], [place] [address]  Urinalysis, Routine w reflex microscopic     Status: Abnormal   Collection Time: 08/01/19  8:34 PM  Result Value Ref Range   Color, Urine STRAW (A) YELLOW   APPearance CLEAR CLEAR   Specific Gravity, Urine 1.014 1.005 - 1.030   pH 5.0 5.0 - 8.0   Glucose, UA 50 (A)  NEGATIVE mg/dL   Hgb urine dipstick NEGATIVE NEGATIVE   Bilirubin Urine NEGATIVE NEGATIVE   Ketones, ur NEGATIVE NEGATIVE mg/dL   Protein, ur 30 (A) NEGATIVE mg/dL   Nitrite NEGATIVE NEGATIVE   Leukocytes,Ua NEGATIVE NEGATIVE   WBC, UA 0-5 0 - 5 WBC/hpf   Bacteria, UA Thomas Kline SEEN Thomas Kline SEEN   Squamous Epithelial / LPF 0-5 0 - 5   Mucus PRESENT    Hyaline Casts, UA PRESENT     Comment: Performed at [facility] [address], [place], [place] [address]  Urine rapid drug screen (hosp performed)     Status: Thomas Kline   Collection Time: 08/01/19  8:35 PM  Result Value Ref Range   Opiates Thomas Kline DETECTED Thomas Kline DETECTED   Cocaine Thomas Kline DETECTED Thomas Kline DETECTED   Benzodiazepines Thomas Kline DETECTED Thomas Kline DETECTED   Amphetamines Thomas Kline DETECTED Thomas Kline DETECTED   Tetrahydrocannabinol Thomas Kline DETECTED Thomas Kline DETECTED   Barbiturates Thomas Kline DETECTED Thomas Kline DETECTED    Comment: (NOTE) DRUG SCREEN FOR MEDICAL PURPOSES ONLY.  IF CONFIRMATION IS NEEDED FOR ANY PURPOSE, NOTIFY LAB WITHIN 5 DAYS. LOWEST DETECTABLE LIMITS FOR URINE DRUG SCREEN Drug Class                     Cutoff (ng/mL) Amphetamine and metabolites    1000 Barbiturate and metabolites    200 Benzodiazepine                 [id number] Tricyclics and metabolites     300 Opiates and metabolites        300 Cocaine and metabolites        300 THC                            50 Performed at [facility], [place] [address], [place], [place] [address]   SARS Coronavirus 2 by RT PCR (hospital order, performed in [facility] hospital lab) Nasopharyngeal Nasopharyngeal Swab     Status: Thomas Kline   Collection Time: 08/01/19 10:33 PM   Specimen: Nasopharyngeal Swab  Result Value Ref Range  SARS Coronavirus 2 NEGATIVE NEGATIVE    Comment: (NOTE) SARS-CoV-2 target nucleic acids are NOT DETECTED. The SARS-CoV-2 RNA is generally detectable in upper and lower respiratory specimens during the acute phase of infection. The lowest concentration of SARS-CoV-2 viral copies this  assay can detect is 250 copies / mL. A negative result does not preclude SARS-CoV-2 infection and should not be used as the sole basis for treatment or other patient management decisions.  A negative result may occur with improper specimen collection / handling, submission of specimen other than nasopharyngeal swab, presence of viral mutation(s) within the areas targeted by this assay, and inadequate number of viral copies (<250 copies / mL). A negative result must be combined with clinical observations, patient history, and epidemiological information. Fact Sheet for Patients:   StrictlyIdeas.no Fact Sheet for Healthcare Providers: BankingDealers.co.za This test is not yet approved or cleared  by the [place] FDA and has been authorized for detection and/or diagnosis of SARS-CoV-2 by FDA under an Emergency Use Authorization (EUA).  This EUA will remain in effect (meaning this test can be used) for the duration of the COVID-19 declaration under Section 564(b)(1) of the Act, 21 U.S.C. section 360bbb-3(b)(1), unless the authorization is terminated or revoked sooner. Performed at [facility], [place] [address] [place], [place] [id number]   Basic metabolic panel     Status: Abnormal   Collection Time: 08/02/19  2:59 AM  Result Value Ref Range   Sodium 140 135 - 145 mmol/L   Potassium 3.7 3.5 - 5.1 mmol/L   Chloride 109 98 - 111 mmol/L   CO2 24 22 - 32 mmol/L   Glucose, Bld 113 (H) 70 - 99 mg/dL    Comment: Glucose reference range applies only to samples taken after fasting for at least 8 hours.   BUN 9 8 - 23 mg/dL   Creatinine, Ser 0.91 0.61 - 1.24 mg/dL   Calcium 8.7 (L) 8.9 - 10.3 mg/dL   GFR calc non Af Amer >60 >60 mL/min   GFR calc Af Amer >60 >60 mL/min   Anion gap 7 5 - 15    Comment: Performed at [facility] [address], [place], [place] [address]  CBC     Status: Abnormal   Collection Time: 08/02/19  2:59  AM  Result Value Ref Range   WBC 11.5 (H) 4.0 - 10.5 K/uL   RBC 4.21 (L) 4.22 - 5.81 MIL/uL   Hemoglobin 11.8 (L) 13.0 - 17.0 g/dL   HCT 37.7 (L) 39.0 - 52.0 %   MCV 89.5 80.0 - 100.0 fL   MCH 28.0 26.0 - 34.0 pg   MCHC 31.3 30.0 - 36.0 g/dL   RDW 13.7 11.5 - 15.5 %   Platelets 393 150 - 400 K/uL   nRBC 0.0 0.0 - 0.2 %    Comment: Performed at [facility], [place] [address] [place], [place] [address]  Glucose, capillary     Status: Abnormal   Collection Time: 08/02/19  8:39 AM  Result Value Ref Range   Glucose-Capillary 102 (H) 70 - 99 mg/dL    Comment: Glucose reference range applies only to samples taken after fasting for at least 8 hours.  Glucose, capillary     Status: Abnormal   Collection Time: 08/02/19 11:35 AM  Result Value Ref Range   Glucose-Capillary 167 (H) 70 - 99 mg/dL    Comment: Glucose reference range applies only to samples taken after fasting for at  least 8 hours.  Glucose, capillary     Status: Abnormal   Collection Time: 08/02/19  3:44 PM  Result Value Ref Range   Glucose-Capillary 132 (H) 70 - 99 mg/dL    Comment: Glucose reference range applies only to samples taken after fasting for at least 8 hours.    CT Head Wo Contrast  Result Date: 08/01/2019 CLINICAL DATA:  Seizure EXAM: CT HEAD WITHOUT CONTRAST TECHNIQUE: Contiguous axial images were obtained from the base of the skull through the vertex without intravenous contrast. COMPARISON:  Thomas Kline. FINDINGS: Brain: No evidence of acute territorial infarction, hemorrhage, hydrocephalus,extra-axial collection or mass lesion/mass effect. There is dilatation the ventricles and sulci consistent with age-related atrophy. Low-attenuation changes in the deep white matter consistent with small vessel ischemia. Vascular: No hyperdense vessel or unexpected calcification. Skull: The skull is intact. No fracture or focal lesion identified. Sinuses/Orbits: There is near opacification seen throughout all the paranasal sinuses  with remodeling of the ethmoid air cells. The mastoid air cells appear to be intact. Other: Thomas Kline Cervical spine: Alignment: There is reversal of the normal cervical lordosis. Skull base and vertebrae: Visualized skull base is intact. No atlanto-occipital dissociation. The patient has had prior partial laminectomy at C3 through C6. There is interbody fusion from C4 through C6. Soft tissues and spinal canal: The visualized paraspinal soft tissues are unremarkable. No prevertebral soft tissue swelling is seen. The spinal canal is grossly unremarkable, no large epidural collection or significant canal narrowing. Disc levels: Multilevel cervical spine spondylosis is seen with disc osteophyte complex and uncovertebral osteophytes most notable at C3-C4 with moderate neural foraminal narrowing Upper chest: Biapical subpleural bleb formation is seen. Thoracic inlet is within normal limits. Other: Thomas Kline IMPRESSION: No acute intracranial abnormality. Findings consistent with age related atrophy and chronic small vessel ischemia Complete opacification of the paranasal sinuses with chronic remodeling No acute fracture or malalignment of the spine. Prior partial decompression and interbody fusion from C3 through C6 Electronically Signed   By: Prudencio Pair M.D.   On: 08/01/2019 20:08   CT Cervical Spine Wo Contrast  Result Date: 08/01/2019 CLINICAL DATA:  Seizure EXAM: CT HEAD WITHOUT CONTRAST TECHNIQUE: Contiguous axial images were obtained from the base of the skull through the vertex without intravenous contrast. COMPARISON:  Thomas Kline. FINDINGS: Brain: No evidence of acute territorial infarction, hemorrhage, hydrocephalus,extra-axial collection or mass lesion/mass effect. There is dilatation the ventricles and sulci consistent with age-related atrophy. Low-attenuation changes in the deep white matter consistent with small vessel ischemia. Vascular: No hyperdense vessel or unexpected calcification. Skull: The skull is intact. No  fracture or focal lesion identified. Sinuses/Orbits: There is near opacification seen throughout all the paranasal sinuses with remodeling of the ethmoid air cells. The mastoid air cells appear to be intact. Other: Thomas Kline Cervical spine: Alignment: There is reversal of the normal cervical lordosis. Skull base and vertebrae: Visualized skull base is intact. No atlanto-occipital dissociation. The patient has had prior partial laminectomy at C3 through C6. There is interbody fusion from C4 through C6. Soft tissues and spinal canal: The visualized paraspinal soft tissues are unremarkable. No prevertebral soft tissue swelling is seen. The spinal canal is grossly unremarkable, no large epidural collection or significant canal narrowing. Disc levels: Multilevel cervical spine spondylosis is seen with disc osteophyte complex and uncovertebral osteophytes most notable at C3-C4 with moderate neural foraminal narrowing Upper chest: Biapical subpleural bleb formation is seen. Thoracic inlet is within normal limits. Other: Thomas Kline IMPRESSION: No acute intracranial abnormality. Findings consistent with  age related atrophy and chronic small vessel ischemia Complete opacification of the paranasal sinuses with chronic remodeling No acute fracture or malalignment of the spine. Prior partial decompression and interbody fusion from C3 through C6 Electronically Signed   By: Prudencio Pair M.D.   On: 08/01/2019 20:08    Review of Systems  All other systems reviewed and are negative.  Blood pressure (!) 156/89, pulse 84, temperature 97.8 F (36.6 C), temperature source Oral, resp. rate 16, height 6' (1.829 m), weight 101 kg, SpO2 100 %. Physical Exam  Constitutional: He is oriented to person, place, and time. He appears well-developed and well-nourished. No distress.  HENT:  Head: Normocephalic and atraumatic.  Right Ear: External ear normal.  Left Ear: External ear normal.  Mouth/Throat: Oropharynx is clear and moist.  Nasal  passages completely obstructed by polyp with left side extending out of the nasal ala.  External nose bulbous from polyp pushing out.  Eyes: Pupils are equal, round, and reactive to light. Conjunctivae and EOM are normal.  Cardiovascular: Normal rate.  Respiratory: Effort normal.  Musculoskeletal:     Cervical back: Normal range of motion and neck supple.  Neurological: He is alert and oriented to person, place, and time. No cranial nerve deficit.  Skin: Skin is warm and dry.  Psychiatric: He has a normal mood and affect. His behavior is normal. Judgment and thought content normal.    Assessment/Plan: Chronic pansinusitis and nasal polyposis  I personally reviewed his head CT demonstrating extensive bilateral sinonasal polyps involving all of the sinuses and completely opacifying the nasal passages.  I discussed his case with Family Medicine.  I do not know if his sinus problem is contributing to his seizure activity.  Still, his condition is quite severe.  I recommended proceeding with endoscopic sinus surgery with Fusion to address both sides.  A Fusion sinus CT has been ordered.  Surgery is planned tomorrow afternoon.  Risks, benefits, and alternatives were discussed and he expressed understanding and agreement.  Melida Quitter 08/02/2019, 5:19 PM

## 2019-08-02 NOTE — ED Notes (Signed)
Pt transported to Thomas Kline with Urban Gibson, [place]

## 2019-08-02 NOTE — H&P (Addendum)
[facility] Admission History and Physical Service Pager: [phone]  Patient name: Thomas Kline Medical record number: [id number] Date of birth: 05-Dec-1950 Age: 69 y.o. Gender: male  Primary Care Provider: Nuala Alpha, DO Consultants: Neurology Code Status: Partial Preferred Emergency Contact: None  Chief Complaint: seizure  Assessment and Plan: Thomas Kline is a 69 y.o. male presenting with witnessed seizure. PMH is significant for seizure disorder, pansinusitis and obstructing polyposis, HTN, GERD.  Seizure-like activity Patient has h/o seizures and had witnessed seizure-like activity while riding the bus today. Per the ED provider, patient had what was described as full body convulsions for approx 5 minutes. EMS arrived and brought him to [facility], but he is not sure how he got to the hospital and does not remember the event. He says he is taking one medication twice daily, but he is not sure of the name or purpose of medication. Previously patient has returned quickly to baseline, but patient is still confused several hours later. He is alert and oriented to name, DOB, and place, but not time. He also has an ataxic gait, describes his legs as feeling weak, and needs assistance to walk. At his baseline, patient is AOx4 and needs no assistance walking. Patient states that he does not have an emergency contact, and has no friends or family to call to help him. During seizure, patient did not bite tongue, did not lose bowel/bladder function. CT head obtained which shows no acute intracranial abnormality. Findings consistent with age related atrophy and chronic small vessel ischemia as well as complete opacification of the paranasal sinuses with chronic remodeling. CT c-spine shows no acute fracture or malalignment of the spine, but there is a partial decompression and interbody fusion from C3-C6 seen previously. Neurology was consulted by ED provider and recommends  restarting patient's Keppra dosing. If patient is not at his baseline tomorrow, they should be reconsulted. Patient received loading dose of Keppra 1000mg  in the ED, has not had any repeat seizure activity in the ED. Vital signs stable, although notable hypertension 175/100 (see below). On exam, no focal neurologic deficits. Labs reveal mild leukocytosis to 11, consistent with demargination seen in seizures. CMP grossly normal, Bicarb low at 16, AG elevated at 19, consistent with seizure. UA with glucose 50, protein 30, micro with hyaline casts. Blood glucose 170. Last A1c 5.8% in 2015, see below. UDS negative, EtOH <10, consistent with patient report of only rare alcohol intake (last drink at Christmas). Patient has a h/o seizures, first in 2012, second event in 2017, third event in March of this year. At his last admission, imaging showed no intracranial masses, polyposis did not extend into brain. Last EEG in March was suggestive of mild diffuse encephalopathy with no specific etiology, no seizures epileptiform discharges were noted. It is possible that patient was not perfectly compliant with medication, or potentially seizures are refractory to medication. It is also possible that a metabolic event such as a cold may tip patient into seizures with or without medication. Patient is afebrile, lungs clear to auscultation, he does have rhinorrhea, but this is his baseline. AM labs to follow, will check A1c. Plan to admit patient for observation overnight to allow him more time to return to baseline. If improved in the morning, Neurology recommended he can be discharged with follow up outpatient.  -Admit to [place], attending Dr. Nori Riis -Seizure precautions -Keppra 500mg  BID -Keppra level ordered -Vitals per routine - strict I/O -TOC consult to help patient with community  resources and medication management  HTN Patient with history of HTN, at admission today BP is 172/99. No head ache, CP, SOB or  changes in vision. Patient discharged in March on HCTZ, though it does not seem he has adhered to treatment. Patient tends to think that completing a prescription/bottle of medicine will make the problem go away. CT head found evidence of chronic vessel disease, likely due to uncontrolled HTN. -Continue to monitor -restart HCTZ 12.5 mg  - TOC on discharge to fill medication  Chronic pansinusitis with obstructing nasal polyposis Patient with long history of sinus disease and obstructive nasal polyps on L side. Patient followed by [facility] and saw Dr. Erik Obey  on [id number]. Most recently patient given short burst and taper of prednisone to use when fully obstructed on 06/08/19. He presently has copious rhinorrhea, but does not complain of it, states this is his baseline. Denies difficulty with breathing.  - Nasal saline rinse for comfort prn - consider treating for sinusitis  Hyperglycemia- no known history of DM. Glucose on admission 170 and 50 glucose in urinalysis. Possibly lowering seizure threshold.  - CBG monitoring with SSSI - hgb A1c  GERD Stable, chronic. Patient not currently taking protonix. -monitor for reflux  FEN/GI: regular diet Prophylaxis: lovenox  Disposition: to med-surg for observation pending patient's return to mental baseline s/p seizure  History of Present Illness:  Thomas Kline is a 69 y.o. male presenting to the ED after having a witnessed seizure on a bus. He remembers sitting on the bus, and the next thing he knew, he woke up in [place]. Patient states that he is taking his medication: one pill that he takes twice per day. He is not sure what this pill is for. He states that he has no family or friends who can help take care of him. He does not have an emergency contact. He is still somewhat confused. He knows his name, DOB, location, and year, but when asked the month, he thought it was December. He states that his legs feel a little weak. He denies a HA, vision changes,  cough, fever, CP, SOB, n/v/d, or rash. Able to follow commands.   Review Of Systems: Per HPI with the following additions:   Review of Systems  Constitutional: Negative for chills and fever.  HENT: Positive for congestion and rhinorrhea. Negative for sneezing and sore throat.   Respiratory: Negative for cough, shortness of breath and wheezing.   Cardiovascular: Negative for chest pain, palpitations and leg swelling.  Gastrointestinal: Negative for abdominal pain, constipation, diarrhea, nausea and vomiting.  Musculoskeletal: Negative for arthralgias.  Skin: Negative for rash.  Neurological: Positive for seizures and weakness. Negative for dizziness, light-headedness and headaches.  Psychiatric/Behavioral: Positive for confusion. Negative for agitation.     Patient Active Problem List   Diagnosis Date Noted  . Seizure ([place]) 08/02/2019    Past Medical History: parasinus disease and obstructing polyposis on L Seizure d/o HTN GERD pancolonic diverticulosis  Past Surgical History: Colonoscopy in 2013  Social History: Social History   Tobacco Use  . Smoking status: Not on file  Substance Use Topics  . Alcohol use: Not on file  . Drug use: Not on file   Additional social history: lives alone Please also refer to relevant sections of EMR.  Family History: See merged chart  Allergies and Medications: No known allergies  Objective: BP (!) 161/90   Pulse 85   Temp 98.5 F (36.9 C) (Oral)   Resp 18  SpO2 99%  Exam: General: pleasant, older, African gentleman, resting comfortably in bed, NAD Eyes: anicteric sclerae ENTM: large, obstructing nasal polyp in L nare with copious clear/green rhinorrhea Neck: supple Cardiovascular: RRR, no m/r/g Respiratory: CTAB Gastrointestinal: soft, NT, ND, normal bowel sounds + MSK: warm, dry, no edema Derm: no rashes or lesions Neuro:  Cranial Nerves II: PERRL.  III,IV, VI: EOMI without ptosis or diplopia.  V: Facial sensation  is symmetric to touch VII: Facial movement is symmetric.  VIII: hearing is intact to voice X: Palate elevates symmetrically XI: Shoulder shrug is symmetric. XII: tongue is midline without atrophy or fasciculations.  Motor: Tone is normal. Bulk is normal. 5/5 strength was present in all four extremities.  Deep Tendon Reflexes: 2+ and symmetric in the biceps and patellae.  Cerebellar: FNF and HKS are intact bilaterally. Ataxic gait as described by ED provider Psych: normal mood, full affect  Labs and Imaging: CBC BMET  Recent Labs  Lab 08/01/19 1913  WBC 11.0*  HGB 12.3*  HCT 39.4  PLT 464*   Recent Labs  Lab 08/01/19 1913  NA 139  K 4.0  CL 104  CO2 16*  BUN 11  CREATININE 1.26*  GLUCOSE 179*  CALCIUM 8.8*     EKG: sinus tachycardia, baseline wander, possible LVH  CT Head Wo Contrast  Result Date: 08/01/2019 CLINICAL DATA:  Seizure EXAM: CT HEAD WITHOUT CONTRAST TECHNIQUE: Contiguous axial images were obtained from the base of the skull through the vertex without intravenous contrast. COMPARISON:  None. FINDINGS: Brain: No evidence of acute territorial infarction, hemorrhage, hydrocephalus,extra-axial collection or mass lesion/mass effect. There is dilatation the ventricles and sulci consistent with age-related atrophy. Low-attenuation changes in the deep white matter consistent with small vessel ischemia. Vascular: No hyperdense vessel or unexpected calcification. Skull: The skull is intact. No fracture or focal lesion identified. Sinuses/Orbits: There is near opacification seen throughout all the paranasal sinuses with remodeling of the ethmoid air cells. The mastoid air cells appear to be intact. Other: None Cervical spine: Alignment: There is reversal of the normal cervical lordosis. Skull base and vertebrae: Visualized skull base is intact. No atlanto-occipital dissociation. The patient has had prior partial laminectomy at C3 through C6. There is interbody fusion from C4  through C6. Soft tissues and spinal canal: The visualized paraspinal soft tissues are unremarkable. No prevertebral soft tissue swelling is seen. The spinal canal is grossly unremarkable, no large epidural collection or significant canal narrowing. Disc levels: Multilevel cervical spine spondylosis is seen with disc osteophyte complex and uncovertebral osteophytes most notable at C3-C4 with moderate neural foraminal narrowing Upper chest: Biapical subpleural bleb formation is seen. Thoracic inlet is within normal limits. Other: None IMPRESSION: No acute intracranial abnormality. Findings consistent with age related atrophy and chronic small vessel ischemia Complete opacification of the paranasal sinuses with chronic remodeling No acute fracture or malalignment of the spine. Prior partial decompression and interbody fusion from C3 through C6 Electronically Signed   By: Prudencio Pair M.D.   On: 08/01/2019 20:08   CT Cervical Spine Wo Contrast  Result Date: 08/01/2019 CLINICAL DATA:  Seizure EXAM: CT HEAD WITHOUT CONTRAST TECHNIQUE: Contiguous axial images were obtained from the base of the skull through the vertex without intravenous contrast. COMPARISON:  None. FINDINGS: Brain: No evidence of acute territorial infarction, hemorrhage, hydrocephalus,extra-axial collection or mass lesion/mass effect. There is dilatation the ventricles and sulci consistent with age-related atrophy. Low-attenuation changes in the deep white matter consistent with small vessel ischemia. Vascular: No  hyperdense vessel or unexpected calcification. Skull: The skull is intact. No fracture or focal lesion identified. Sinuses/Orbits: There is near opacification seen throughout all the paranasal sinuses with remodeling of the ethmoid air cells. The mastoid air cells appear to be intact. Other: None Cervical spine: Alignment: There is reversal of the normal cervical lordosis. Skull base and vertebrae: Visualized skull base is intact. No  atlanto-occipital dissociation. The patient has had prior partial laminectomy at C3 through C6. There is interbody fusion from C4 through C6. Soft tissues and spinal canal: The visualized paraspinal soft tissues are unremarkable. No prevertebral soft tissue swelling is seen. The spinal canal is grossly unremarkable, no large epidural collection or significant canal narrowing. Disc levels: Multilevel cervical spine spondylosis is seen with disc osteophyte complex and uncovertebral osteophytes most notable at C3-C4 with moderate neural foraminal narrowing Upper chest: Biapical subpleural bleb formation is seen. Thoracic inlet is within normal limits. Other: None IMPRESSION: No acute intracranial abnormality. Findings consistent with age related atrophy and chronic small vessel ischemia Complete opacification of the paranasal sinuses with chronic remodeling No acute fracture or malalignment of the spine. Prior partial decompression and interbody fusion from C3 through C6 Electronically Signed   By: Prudencio Pair M.D.   On: 08/01/2019 20:08   Gladys Damme, MD 08/02/2019, 12:55 AM PGY-1, [facility] Intern pager: [phone], text pages welcome  [place]    I have seen and examined this patient.     I have discussed the findings and exam with the intern and agree with the above note, which I have edited appropriately in Center. I helped develop the management plan that is described in the resident's note, and I agree with the content.   Doristine Mango, DO PGY-2 Family Medicine Resident

## 2019-08-02 NOTE — Progress Notes (Signed)
FPTS Interim Progress Note  S: Sitting up in bed alert. Able to tell me his name, DOB, place, date, month and year. Appeared a little muddled. I am not sure whether this is patient's baseline.  O: BP (!) 152/82 (BP Location: Right Arm)   Pulse 69   Temp 98 F (36.7 C) (Oral)   Resp 16   Ht 6' (1.829 m)   Wt 101 kg   SpO2 99%   BMI 30.20 kg/m   General: Alert, no acute distress HEENT: Obvious left nasal polyp, large amount of rhinorrhea Cardio: Normal S1 and S2, RRR Pulm: CTAB, normal WOB  Abdomen: Bowel sounds normal. Abdomen soft and non-tender.  Extremities: No peripheral edema. Warm/ well perfused.  Strong radial pulse Neuro: Cranial nerves grossly intact  A/P:  Seizure like activity -continue keppra 500mg  BID -Seizure precautions -PT/OT -TOC for meds  HTN -Monitor Bps  -HCTZ increased to 25mg  BID today  Chronic pansinusitis  and nasal polyposis -ENT consult for surgery, nasal polyps may be lowering patient's seizure threshold, leading to recurrent seizures. -Continue nasal saline rinse for comfort prn   Lattie Haw, MD 08/02/2019, 3:01 PM PGY-1, [place] Medicine Service pager [phone]

## 2019-08-02 NOTE — Discharge Summary (Signed)
[facility] Discharge Summary  Patient name: Zabien Cuthbertson Medical record number: [id number] Date of birth: 08/09/1950 Age: 69 y.o. Gender: male Date of Admission: 08/01/2019  Date of Discharge: 08/06/19 Admitting Physician: Lyndee Hensen, DO  Primary Care Provider: Nuala Alpha, DO Consultants: ENT  Indication for Hospitalization: Seizures  Discharge Diagnoses/Problem List:  Seizures Hypertension Chronic pansinusitis Nasal polyposis  Anemia with blood loss New diagnosis type 2 diabetes    [facility] Discharge Summary  Patient name: Ynes Woltering          Medical record number: [id number] Date of birth: Apr 27, 1950      Age: 69 y.o.    Gender: male Date of Admission: 06/03/2019                      Date of Discharge: 06/04/2019 Admitting Physician: Lind Covert, MD  Primary Care Provider: Nuala Alpha, DO Consultants: Neurology  Indication for Hospitalization: Seizure-like activity  Discharge Diagnoses/Problem List:  Seizure-like activity Hypertension Chronic pansinusitis with obstructing nasal polyposis  Disposition:  SNF   Discharge Condition: Medically stable for discharge  Discharge Exam:   General: Alert, no acute distress Cardio: Normal S1 and S2, RRR Pulm: CTAB, normal WOB  Abdomen: Bowel sounds normal. Abdomen soft and non-tender.  Extremities: No peripheral edema. Warm/ well perfused. Neuro: Cranial nerves grossly intact  Brief Hospital Course:  Virden Mclafferty a 69 y.o.malepresenting with seizure-like activity. PMH is significant forHTN, GERD, nasal polyps, pancolonic diverticulosis, and seizures. Admission details can be found in H&P.  Seizure-like activity CT head performed, no acute intracranial trauma or skull fracture.  CT Keppra loading dose 1000 mg in the ED and restarted home Keppra 500 mg twice daily.  Had no further seizure episodes in hospital. WBC 11, CBG  170, UDS negative, EtOH <10. Evaluated by PT who recommended SNF.  Nasal polyposis He was evaluated by ENT due to large obstructing nasal polyp and will left nare with copious green rhinorrhea.  Patient went surgery on 5/18 for bilateral total ethmoidectomy, frontal recess expiration, maxillary antrostomy with tissue removal, bilateral sphenoidectomy with tissue removal.  Patient lost a total of 700 cc of blood. His nasal passages were packed and kept in in the hospital. They will be removed at ENT follow up.   Anemia due to acute blood loss Hb on admission was 12. Postoperatively was 8-9. Was started on iron supplements. Hb on discharge is 8.6.  Hypertension Patient's blood pressures were elevated on admission. We doubled home dose of hydrochlorothiazide to 25 mg once daily and started amlodipine 5 mg daily.  New diagnosis of type 2 diabetes A1c on admission 6.9. Patient was treated with sensitive sliding scale. The decision was made not to treat diabetes with medication and start with diet control due to patient's poor compliance with previous medications and poor follow-up to appointments previously.  Issues for Follow Up:   1. Ensures follow up with ENT on Monday 24 May where nasal packs will be removed.  2. Patient was referred to neurology on d/c.  Ensure follow up.  Patient should not drive for 6 months. 4.  It appears that patient has previously thought that he "completes medications" after finishing the bottle that he has.  Please ensure the patient continues to stay on his chronic medications. 5.    Monitor hemoglobin as pt's Hb on discharge is 8.6. 6. Should continue 7 day total course of Augmentin.  Significant Procedures:   ENT surgery  on 5/18  Significant Labs and Imaging:  Recent Labs  Lab 08/04/19 0222 08/05/19 0814 08/06/19 0840  WBC 12.2* 8.1 9.0  HGB 9.6* 8.3* 8.6*  HCT 30.2* 26.1* 27.0*  PLT 385 315 390   Recent Labs  Lab 08/01/19 1913 08/01/19 1913  08/02/19 0259 08/02/19 0259 08/03/19 0320 08/03/19 0320 08/03/19 1654 08/04/19 0222  NA 139  --  140  --  137  --  137 139  K 4.0   < > 3.7   < > 3.4*   < > 4.1 4.8  CL 104  --  109  --  102  --  101 104  CO2 16*  --  24  --  25  --   --  26  GLUCOSE 179*  --  113*  --  125*  --  149* 165*  BUN 11  --  9  --  9  --  11 16  CREATININE 1.26*  --  0.91  --  0.95  --  1.00 1.21  CALCIUM 8.8*  --  8.7*  --  8.7*  --   --  8.4*  MG 2.2  --   --   --   --   --   --   --   ALKPHOS 106  --   --   --   --   --   --   --   AST 24  --   --   --   --   --   --   --   ALT 15  --   --   --   --   --   --   --   ALBUMIN 3.6  --   --   --   --   --   --   --    < > = values in this interval not displayed.      Results/Tests Pending at Time of Discharge:    Discharge Medications:  Allergies as of 08/06/2019   No Known Allergies     Medication List    TAKE these medications   amLODipine 5 MG tablet Commonly known as: NORVASC Take 1 tablet (5 mg total) by mouth daily.   amoxicillin-clavulanate 875-125 MG tablet Commonly known as: AUGMENTIN Take 1 tablet by mouth every 12 (twelve) hours for 5 days.   ferrous sulfate 325 (65 FE) MG tablet Take 1 tablet (325 mg total) by mouth daily with breakfast.   fluticasone 50 MCG/ACT nasal spray Commonly known as: FLONASE Place 2 sprays into both nostrils daily as needed for allergies or rhinitis.   hydrochlorothiazide 12.5 MG capsule Commonly known as: MICROZIDE Take 2 capsules (25 mg total) by mouth daily. What changed: how much to take   levETIRAcetam 500 MG tablet Commonly known as: KEPPRA Take 1 tablet (500 mg total) by mouth 2 (two) times daily.   omeprazole 20 MG capsule Commonly known as: PRILOSEC Take 20 mg by mouth 2 (two) times daily.       Discharge Instructions: Please refer to Patient Instructions section of EMR for full details.  Patient was counseled important signs and symptoms that should prompt return to medical  care, changes in medications, dietary instructions, activity restrictions, and follow up appointments.   Follow-Up Appointments: Follow-up Information    Melida Quitter, MD. Schedule an appointment as soon as possible for a visit on 08/09/2019.   Specialty: Otolaryngology Contact information: [address] [place]  [place]           Lattie Haw, MD 08/06/2019, 9:26 AM PGY-1, [place]

## 2019-08-03 ENCOUNTER — Encounter (HOSPITAL_COMMUNITY): Payer: Self-pay | Admitting: Family Medicine

## 2019-08-03 ENCOUNTER — Encounter (HOSPITAL_COMMUNITY)
Admission: EM | Disposition: A | Discharge status: Skilled Nursing Facility | Payer: Self-pay | Source: Home / Self Care | Attending: Family Medicine

## 2019-08-03 ENCOUNTER — Inpatient Hospital Stay (HOSPITAL_COMMUNITY): Payer: Medicare Other | Admitting: Anesthesiology

## 2019-08-03 DIAGNOSIS — J330 Polyp of nasal cavity: Secondary | ICD-10-CM

## 2019-08-03 HISTORY — PX: FRONTAL SINUS EXPLORATION: SHX6591

## 2019-08-03 HISTORY — PX: ETHMOIDECTOMY: SHX5197

## 2019-08-03 HISTORY — PX: MAXILLARY ANTROSTOMY: SHX2003

## 2019-08-03 HISTORY — PX: COMPUTER ASSISTED IMAGE GUIDE NAVIGATION: SHX5102

## 2019-08-03 HISTORY — PX: SPHENOIDECTOMY: SHX2421

## 2019-08-03 LAB — CBC
HCT: 36.3 % — ABNORMAL LOW (ref 39.0–52.0)
Hemoglobin: 11.6 g/dL — ABNORMAL LOW (ref 13.0–17.0)
MCH: 28 pg (ref 26.0–34.0)
MCHC: 32 g/dL (ref 30.0–36.0)
MCV: 87.5 fL (ref 80.0–100.0)
Platelets: 372 10*3/uL (ref 150–400)
RBC: 4.15 MIL/uL — ABNORMAL LOW (ref 4.22–5.81)
RDW: 13.8 % (ref 11.5–15.5)
WBC: 9 10*3/uL (ref 4.0–10.5)
nRBC: 0 % (ref 0.0–0.2)

## 2019-08-03 LAB — GLUCOSE, CAPILLARY
Glucose-Capillary: 110 mg/dL — ABNORMAL HIGH (ref 70–99)
Glucose-Capillary: 114 mg/dL — ABNORMAL HIGH (ref 70–99)
Glucose-Capillary: 115 mg/dL — ABNORMAL HIGH (ref 70–99)
Glucose-Capillary: 192 mg/dL — ABNORMAL HIGH (ref 70–99)

## 2019-08-03 LAB — BASIC METABOLIC PANEL
Anion gap: 10 (ref 5–15)
BUN: 9 mg/dL (ref 8–23)
CO2: 25 mmol/L (ref 22–32)
Calcium: 8.7 mg/dL — ABNORMAL LOW (ref 8.9–10.3)
Chloride: 102 mmol/L (ref 98–111)
Creatinine, Ser: 0.95 mg/dL (ref 0.61–1.24)
GFR calc Af Amer: >60 mL/min (ref >60)
GFR calc non Af Amer: >60 mL/min (ref >60)
Glucose, Bld: 125 mg/dL — ABNORMAL HIGH (ref 70–99)
Potassium: 3.4 mmol/L — ABNORMAL LOW (ref 3.5–5.1)
Sodium: 137 mmol/L (ref 135–145)

## 2019-08-03 LAB — POCT I-STAT, CHEM 8
BUN: 11 mg/dL (ref 8–23)
Calcium, Ion: 1.15 mmol/L (ref 1.15–1.40)
Chloride: 101 mmol/L (ref 98–111)
Creatinine, Ser: 1 mg/dL (ref 0.61–1.24)
Glucose, Bld: 149 mg/dL — ABNORMAL HIGH (ref 70–99)
HCT: 28 % — ABNORMAL LOW (ref 39.0–52.0)
Hemoglobin: 9.5 g/dL — ABNORMAL LOW (ref 13.0–17.0)
Potassium: 4.1 mmol/L (ref 3.5–5.1)
Sodium: 137 mmol/L (ref 135–145)
TCO2: 26 mmol/L (ref 22–32)

## 2019-08-03 SURGERY — ETHMOIDECTOMY
Anesthesia: General | Site: Nose | Laterality: Bilateral

## 2019-08-03 MED ORDER — SODIUM CHLORIDE 0.9 % IR SOLN
Status: DC | PRN
  Administered 2019-08-03 (×2): 1000 mL

## 2019-08-03 MED ORDER — MUPIROCIN 2 % EX OINT
TOPICAL_OINTMENT | CUTANEOUS | Status: AC
  Filled 2019-08-03: qty 22

## 2019-08-03 MED ORDER — DEXAMETHASONE SODIUM PHOSPHATE 10 MG/ML IJ SOLN
INTRAMUSCULAR | Status: DC | PRN
  Administered 2019-08-03: 10 mg via INTRAVENOUS

## 2019-08-03 MED ORDER — CEFAZOLIN SODIUM 1 G IJ SOLR
INTRAMUSCULAR | Status: AC
  Filled 2019-08-03: qty 10

## 2019-08-03 MED ORDER — ROCURONIUM BROMIDE 10 MG/ML (PF) SYRINGE
PREFILLED_SYRINGE | INTRAVENOUS | Status: DC | PRN
  Administered 2019-08-03: 60 mg via INTRAVENOUS
  Administered 2019-08-03: 20 mg via INTRAVENOUS

## 2019-08-03 MED ORDER — DEXAMETHASONE SODIUM PHOSPHATE 10 MG/ML IJ SOLN
INTRAMUSCULAR | Status: AC
  Filled 2019-08-03: qty 1

## 2019-08-03 MED ORDER — FENTANYL CITRATE (PF) 250 MCG/5ML IJ SOLN
INTRAMUSCULAR | Status: AC
  Filled 2019-08-03: qty 5

## 2019-08-03 MED ORDER — 0.9 % SODIUM CHLORIDE (POUR BTL) OPTIME
TOPICAL | Status: DC | PRN
  Administered 2019-08-03: 1000 mL

## 2019-08-03 MED ORDER — AMLODIPINE BESYLATE 5 MG PO TABS
5.0000 mg | ORAL_TABLET | Freq: Every day | ORAL | Status: DC
  Administered 2019-08-04 – 2019-08-06 (×3): 5 mg via ORAL
  Filled 2019-08-03 (×3): qty 1

## 2019-08-03 MED ORDER — POTASSIUM CHLORIDE CRYS ER 20 MEQ PO TBCR
40.0000 meq | EXTENDED_RELEASE_TABLET | Freq: Once | ORAL | Status: AC
  Administered 2019-08-03: 40 meq via ORAL
  Filled 2019-08-03 (×2): qty 2

## 2019-08-03 MED ORDER — LIDOCAINE-EPINEPHRINE 1 %-1:100000 IJ SOLN
INTRAMUSCULAR | Status: AC
  Filled 2019-08-03: qty 1

## 2019-08-03 MED ORDER — ONDANSETRON HCL 4 MG/2ML IJ SOLN
INTRAMUSCULAR | Status: DC | PRN
  Administered 2019-08-03: 4 mg via INTRAVENOUS

## 2019-08-03 MED ORDER — OXYMETAZOLINE HCL 0.05 % NA SOLN
NASAL | Status: DC | PRN
  Administered 2019-08-03 (×2): 1 via TOPICAL

## 2019-08-03 MED ORDER — OXYMETAZOLINE HCL 0.05 % NA SOLN
NASAL | Status: AC
  Filled 2019-08-03: qty 30

## 2019-08-03 MED ORDER — ACETAMINOPHEN 500 MG PO TABS
1000.0000 mg | ORAL_TABLET | Freq: Once | ORAL | Status: AC
  Administered 2019-08-03: 1000 mg via ORAL
  Filled 2019-08-03: qty 2

## 2019-08-03 MED ORDER — LIDOCAINE 2% (20 MG/ML) 5 ML SYRINGE
INTRAMUSCULAR | Status: DC | PRN
  Administered 2019-08-03: 100 mg via INTRAVENOUS

## 2019-08-03 MED ORDER — SODIUM CHLORIDE 0.9 % IV SOLN
3.0000 g | INTRAVENOUS | Status: AC
  Administered 2019-08-03: 3 g via INTRAVENOUS
  Filled 2019-08-03: qty 3

## 2019-08-03 MED ORDER — LIDOCAINE-EPINEPHRINE 1 %-1:100000 IJ SOLN
INTRAMUSCULAR | Status: DC | PRN
  Administered 2019-08-03: 20 mL

## 2019-08-03 MED ORDER — MUPIROCIN 2 % EX OINT
TOPICAL_OINTMENT | CUTANEOUS | Status: DC | PRN
  Administered 2019-08-03: 1 via NASAL

## 2019-08-03 MED ORDER — LACTATED RINGERS IV SOLN: INTRAVENOUS | Status: DC | PRN

## 2019-08-03 MED ORDER — FENTANYL CITRATE (PF) 250 MCG/5ML IJ SOLN
INTRAMUSCULAR | Status: DC | PRN
  Administered 2019-08-03: 100 ug via INTRAVENOUS
  Administered 2019-08-03 (×3): 50 ug via INTRAVENOUS

## 2019-08-03 MED ORDER — ONDANSETRON HCL 4 MG/2ML IJ SOLN
INTRAMUSCULAR | Status: AC
  Filled 2019-08-03: qty 2

## 2019-08-03 MED ORDER — PROPOFOL 10 MG/ML IV BOLUS
INTRAVENOUS | Status: DC | PRN
  Administered 2019-08-03: 120 mg via INTRAVENOUS

## 2019-08-03 MED ORDER — ARTIFICIAL TEARS OPHTHALMIC OINT
TOPICAL_OINTMENT | OPHTHALMIC | Status: DC | PRN
  Administered 2019-08-03: 1 via OPHTHALMIC

## 2019-08-03 MED ORDER — SUGAMMADEX SODIUM 200 MG/2ML IV SOLN
INTRAVENOUS | Status: DC | PRN
  Administered 2019-08-03: 100 mg via INTRAVENOUS

## 2019-08-03 MED ORDER — PROPOFOL 10 MG/ML IV BOLUS
INTRAVENOUS | Status: AC
  Filled 2019-08-03: qty 20

## 2019-08-03 MED ORDER — ALBUMIN HUMAN 5 % IV SOLN: INTRAVENOUS | Status: DC | PRN

## 2019-08-03 SURGICAL SUPPLY — 53 items
ATTRACTOMAT 16X20 MAGNETIC DRP (DRAPES) IMPLANT
BLADE RAD40 ROTATE 4M 4 5PK (BLADE) IMPLANT
BLADE RAD40 ROTATE 4M 4MM 5PK (BLADE)
BLADE RAD60 ROTATE M4 4 5PK (BLADE) ×3 IMPLANT
BLADE RAD60 ROTATE M4 4MM 5PK (BLADE) ×1
BLADE ROTATE TRICUT 4MX13CM M4 (BLADE)
BLADE ROTATE TRICUT 4X13 M4 (BLADE) IMPLANT
BLADE SURG 15 STRL LF DISP TIS (BLADE) IMPLANT
BLADE SURG 15 STRL SS (BLADE)
BLADE TRICUT ROTATE M4 4 5PK (BLADE) ×3 IMPLANT
BLADE TRICUT ROTATE M4 4MM 5PK (BLADE) ×1
CANISTER SUCT 3000ML PPV (MISCELLANEOUS) ×4 IMPLANT
CLOSURE STERI-STRIP 1/2X4 (GAUZE/BANDAGES/DRESSINGS) ×1
CLSR STERI-STRIP ANTIMIC 1/2X4 (GAUZE/BANDAGES/DRESSINGS) ×3 IMPLANT
COAGULATOR SUCT 6 FR SWTCH (ELECTROSURGICAL)
COAGULATOR SUCT SWTCH 10FR 6 (ELECTROSURGICAL) IMPLANT
COVER WAND RF STERILE (DRAPES) IMPLANT
DRAPE HALF SHEET 40X57 (DRAPES) ×4 IMPLANT
DRESSING NASAL POPE 10X1.5X2.5 (GAUZE/BANDAGES/DRESSINGS) IMPLANT
DRSG NASAL POPE 10X1.5X2.5 (GAUZE/BANDAGES/DRESSINGS)
DRSG NASOPORE 8CM (GAUZE/BANDAGES/DRESSINGS) IMPLANT
DRSG TELFA 3X8 NADH (GAUZE/BANDAGES/DRESSINGS) IMPLANT
ELECT REM PT RETURN 9FT ADLT (ELECTROSURGICAL)
ELECTRODE REM PT RTRN 9FT ADLT (ELECTROSURGICAL) IMPLANT
GLOVE BIO SURGEON STRL SZ7.5 (GLOVE) ×4 IMPLANT
GOWN STRL REUS W/ TWL LRG LVL3 (GOWN DISPOSABLE) ×4 IMPLANT
GOWN STRL REUS W/TWL LRG LVL3 (GOWN DISPOSABLE) ×4
KIT BASIN OR (CUSTOM PROCEDURE TRAY) ×4 IMPLANT
KIT TURNOVER KIT B (KITS) ×4 IMPLANT
NEEDLE HYPO 25GX1X1/2 BEV (NEEDLE) IMPLANT
NEEDLE PRECISIONGLIDE 27X1.5 (NEEDLE) ×4 IMPLANT
NEEDLE SPNL 22GX3.5 QUINCKE BK (NEEDLE) ×4 IMPLANT
NS IRRIG 1000ML POUR BTL (IV SOLUTION) ×4 IMPLANT
PAD ARMBOARD 7.5X6 YLW CONV (MISCELLANEOUS) ×8 IMPLANT
PAD ENT ADHESIVE 25PK (MISCELLANEOUS) ×4 IMPLANT
PATTIES SURGICAL .5 X1 (DISPOSABLE) IMPLANT
PATTIES SURGICAL .5 X3 (DISPOSABLE) ×12 IMPLANT
POSITIONER HEAD DONUT 9IN (MISCELLANEOUS) IMPLANT
SHEATH ENDOSCRUB 0 DEG (SHEATH) ×4 IMPLANT
SHEATH ENDOSCRUB 30 DEG (SHEATH) ×4 IMPLANT
SHEATH ENDOSCRUB 45 DEG (SHEATH) ×4 IMPLANT
SOL ANTI FOG 6CC (MISCELLANEOUS) ×2 IMPLANT
SOLUTION ANTI FOG 6CC (MISCELLANEOUS) ×2
SUT ETHILON 3 0 PS 1 (SUTURE) IMPLANT
SWAB COLLECTION DEVICE MRSA (MISCELLANEOUS) IMPLANT
SYR 50ML SLIP (SYRINGE) IMPLANT
TRACKER ENT INSTRUMENT (MISCELLANEOUS) IMPLANT
TRACKER ENT PATIENT (MISCELLANEOUS) IMPLANT
TRAY ENT MC OR (CUSTOM PROCEDURE TRAY) ×4 IMPLANT
TUBE CONNECTING 12'X1/4 (SUCTIONS) ×1
TUBE CONNECTING 12X1/4 (SUCTIONS) ×3 IMPLANT
TUBING STRAIGHTSHOT EPS 5PK (TUBING) IMPLANT
WATER STERILE IRR 1000ML POUR (IV SOLUTION) ×4 IMPLANT

## 2019-08-03 NOTE — Anesthesia Procedure Notes (Signed)
Procedure Name: Intubation Date/Time: 08/03/2019 2:56 PM Performed by: Janace Litten, CRNA Pre-anesthesia Checklist: Patient identified, Emergency Drugs available, Suction available and Patient being monitored Patient Re-evaluated:Patient Re-evaluated prior to induction Oxygen Delivery Method: Circle System Utilized Preoxygenation: Pre-oxygenation with 100% oxygen Induction Type: IV induction Ventilation: Mask ventilation without difficulty Laryngoscope Size: Mac and 4 Grade View: Grade II Tube type: Oral Tube size: 7.5 mm Number of attempts: 1 Airway Equipment and Method: Stylet Placement Confirmation: ETT inserted through vocal cords under direct vision,  positive ETCO2 and breath sounds checked- equal and bilateral Secured at: 23 cm Tube secured with: Tape Dental Injury: Teeth and Oropharynx as per pre-operative assessment

## 2019-08-03 NOTE — Brief Op Note (Signed)
08/01/2019 - 08/03/2019  5:14 PM  PATIENT:  Thomas Kline  69 y.o. male  PRE-OPERATIVE DIAGNOSIS:  NASAL POLYPS, Chronic pansinusitis  POST-OPERATIVE DIAGNOSIS:  NASAL POLYPS, Chronic pansinusitis  PROCEDURE:  Procedure(s): Bilateral total Ethmoidectomy (Bilateral) Bilateral Maxillary Antrostomy with tissue removal (Bilateral) Bilateral Frontal Sinus Exploration (Bilateral) Bilateral Sphenoidectomy with tissue removal (Bilateral) Bilateral Computer Assisted Image Guide Navigation (Bilateral)  SURGEON:  Surgeon(s) and Role:    Melida Quitter, MD - Primary  PHYSICIAN ASSISTANT:   ASSISTANTS: none   ANESTHESIA:   general  EBL:  700 mL   BLOOD ADMINISTERED:none  DRAINS: none   LOCAL MEDICATIONS USED:  LIDOCAINE   SPECIMEN:  Source of Specimen:  Sinus contents  DISPOSITION OF SPECIMEN:  PATHOLOGY  COUNTS:  YES  TOURNIQUET:  * No tourniquets in log *  DICTATION: .Other Dictation: Dictation Number [id number]  PLAN OF CARE: Return to hospital room  PATIENT DISPOSITION:  PACU - hemodynamically stable.   Delay start of Pharmacological VTE agent (>24hrs) due to surgical blood loss or risk of bleeding: yes

## 2019-08-03 NOTE — Evaluation (Signed)
Occupational Therapy Evaluation Patient Details Name: Thomas Kline MRN: [id number] DOB: 1950/10/15 Today's Date: 08/03/2019    History of Present Illness Thomas Kline is a 69 y.o. male presenting with seizure-like activity. PMH is significant for HTN, GERD, nasal polyps, pancolonic diverticulosis, and seizures   Clinical Impression   Pt present with decline in function and safety with ADLs and ADL mobility with impaired balance, endurance, coordination and cognition. Pt lives at home alone in an apartment and reports that he was independent with ADLs/selfcare, home mgt, uses no AD for moibility and does not drive. Pt unsteady during mobility with unsafe stand - sot transitions (impulsive) with Poor safety awareness. Pt stating that he has had to use the bathroom after walking from the bathroom; pt walks back to bathroom and then states "I'm through with the bathroom and I don't need it". Pt requires min guard A with activity standing at sink, LB ADLs and transfers. Pt would benefit from acute OT services to address impairments to maximize level of function and safety    Follow Up Recommendations  No f/u OT, Other (comment)(OP PT for balance?)    Equipment Recommendations  Tub/shower seat    Recommendations for Other Services       Precautions / Restrictions Precautions Precautions: Fall Restrictions Weight Bearing Restrictions: No      Mobility Bed Mobility Overal bed mobility: Needs Assistance Bed Mobility: Supine to Sit;Sit to Supine     Supine to sit: Supervision Sit to supine: Supervision      Transfers Overall transfer level: Needs assistance Equipment used: 1 person hand held assist Transfers: Sit to/from Stand;Stand Pivot Transfers Sit to Stand: Min guard Stand pivot transfers: Min guard       General transfer comment: Pt unsteady, impulsive with unsafe stand - sit transitions    Balance Overall balance assessment: Needs assistance Sitting-balance support:  No upper extremity supported;Feet supported Sitting balance-Leahy Scale: Good     Standing balance support: During functional activity                               ADL either performed or assessed with clinical judgement   ADL Overall ADL's : Needs assistance/impaired     Grooming: Wash/dry hands;Wash/dry face;Min guard;Standing   Upper Body Bathing: Set up;Supervision/ safety;Sitting Upper Body Bathing Details (indicate cue type and reason): simulated Lower Body Bathing: Min guard;Sit to/from stand;Cueing for safety Lower Body Bathing Details (indicate cue type and reason): simulated Upper Body Dressing : Set up;Sitting   Lower Body Dressing: Sitting/lateral leans;Sit to/from stand Lower Body Dressing Details (indicate cue type and reason): doffed socks on command, donned sock halfway after instructed to don back on and pt seemed unaware that sock was not fully donned wihtout verbal cues Toilet Transfer: Min guard;Ambulation;Cueing for safety   Toileting- Clothing Manipulation and Hygiene: Min guard;Sit to/from stand   Tub/ Shower Transfer: Min guard;Ambulation   Functional mobility during ADLs: Min guard General ADL Comments: Pt unsteady during functuional mobility, unaware of deficits and unsafe stand - sit transitions     Vision Baseline Vision/History: (pt reports that he has no visual deficits) Patient Visual Report: No change from baseline       Perception     Praxis      Pertinent Vitals/Pain Pain Assessment: No/denies pain     Hand Dominance Right   Extremity/Trunk Assessment Upper Extremity Assessment Upper Extremity Assessment: Overall WFL for tasks assessed   Lower  Extremity Assessment Lower Extremity Assessment: Defer to PT evaluation   Cervical / Trunk Assessment Cervical / Trunk Assessment: Normal   Communication Communication Communication: No difficulties(speaks English with [place] African accent)   Cognition Arousal/Alertness:  Awake/alert Behavior During Therapy: WFL for tasks assessed/performed Overall Cognitive Status: No family/caregiver present to determine baseline cognitive functioning Area of Impairment: Attention;Memory;Following commands;Safety/judgement;Awareness;Problem solving                   Current Attention Level: Sustained Memory: Decreased short-term memory Following Commands: Follows one step commands consistently Safety/Judgement: Decreased awareness of safety;Decreased awareness of deficits   Problem Solving: Requires verbal cues;Requires tactile cues General Comments: Pt stating that he has had to use the bathroom after walking from the bathroom; pt walks back to bathroom and then states "I'm through with the bathroom and I don't need it"   General Comments       Exercises     Shoulder Instructions      Home Living Family/patient expects to be discharged to:: Private residence Living Arrangements: Alone Available Help at Discharge: Available PRN/intermittently Type of Home: Apartment Home Access: Level entry     Home Layout: One level     Bathroom Shower/Tub: Teacher, early years/pre: Standard     Home Equipment: None          Prior Functioning/Environment Level of Independence: Independent        Comments: pt reports that he was indeependent with ADLs/selfcare, home mgt and uses no AD for mobility; does not drive        OT Problem List: Decreased activity tolerance;Impaired balance (sitting and/or standing);Decreased safety awareness;Decreased knowledge of use of DME or AE;Decreased coordination      OT Treatment/Interventions: Self-care/ADL training;Therapeutic exercise;Balance training;DME and/or AE instruction;Cognitive remediation/compensation    OT Goals(Current goals can be found in the care plan section) Acute Rehab OT Goals Patient Stated Goal: go home OT Goal Formulation: With patient Time For Goal Achievement: 08/23/19 Potential  to Achieve Goals: Good ADL Goals Pt Will Perform Grooming: with supervision;with set-up;standing Pt Will Perform Lower Body Bathing: with supervision;with set-up Pt Will Perform Lower Body Dressing: with supervision;with set-up Pt Will Transfer to Toilet: with supervision;with modified independence;ambulating Pt Will Perform Toileting - Clothing Manipulation and hygiene: with supervision;with modified independence;sit to/from stand Pt Will Perform Tub/Shower Transfer: with supervision;with modified independence;ambulating;grab bars;shower seat  OT Frequency: Min 2X/week   Barriers to D/C:            Co-evaluation              AM-PAC OT "6 Clicks" Daily Activity     Outcome Measure Help from another person eating meals?: None Help from another person taking care of personal grooming?: A Little Help from another person toileting, which includes using toliet, bedpan, or urinal?: A Little Help from another person bathing (including washing, rinsing, drying)?: A Little Help from another person to put on and taking off regular upper body clothing?: A Little Help from another person to put on and taking off regular lower body clothing?: A Little 6 Click Score: 19   End of Session Equipment Utilized During Treatment: Gait belt  Activity Tolerance: Patient tolerated treatment well Patient left: in bed;with call bell/phone within reach;with bed alarm set  OT Visit Diagnosis: Unsteadiness on feet (R26.81);Other abnormalities of gait and mobility (R26.89);Muscle weakness (generalized) (M62.81);History of falling (Z91.81)                Time:  [id number] OT Time Calculation (min): 28 min Charges:  OT General Charges $OT Visit: 1 Visit OT Evaluation $OT Eval Low Complexity: 1 Low OT Treatments $Self Care/Home Management : 8-22 mins    Britt Bottom 08/03/2019, 12:12 PM

## 2019-08-03 NOTE — Transfer of Care (Signed)
Immediate Anesthesia Transfer of Care Note  Patient: Thomas Kline  Procedure(s) Performed: Bilateral total Ethmoidectomy (Bilateral Nose) Bilateral Maxillary Antrostomy with tissue removal (Bilateral Nose) Bilateral Frontal Sinus Exploration (Bilateral Nose) Bilateral Sphenoidectomy with tissue removal (Bilateral Nose) Bilateral Computer Assisted Image Guide Navigation (Bilateral Nose)  Patient Location: PACU  Anesthesia Type:General  Level of Consciousness: awake and drowsy  Airway & Oxygen Therapy: Patient Spontanous Breathing and Patient connected to face mask oxygen  Post-op Assessment: Report given to RN and Post -op Vital signs reviewed and stable  Post vital signs: Reviewed and stable  Last Vitals:  Vitals Value Taken Time  BP 131/70 08/03/19 1726  Temp    Pulse 107 08/03/19 1729  Resp 11 08/03/19 1729  SpO2 95 % 08/03/19 1729  Vitals shown include unvalidated device data.  Last Pain:  Vitals:   08/03/19 1726  TempSrc:   PainSc: (P) 0-No pain         Complications: No apparent anesthesia complications

## 2019-08-03 NOTE — Progress Notes (Signed)
PT Cancellation Note  Patient Details Name: Tymir Oneal MRN: [id number] DOB: 25-Mar-1950   Cancelled Treatment:    Reason Eval/Treat Not Completed: Other (comment) Pt off floor for surgery.  Will follow up tomorrow as able. Maggie Font, PT Acute Rehab Services Pager [phone] [facility] [phone] [facility] [place] 08/03/2019, 4:38 PM

## 2019-08-03 NOTE — Progress Notes (Addendum)
Family Medicine Teaching Service Daily Progress Note Intern Pager: [phone]  Patient name: Thomas Kline Medical record number: [id number] Date of birth: [birth year] Age: 69 y.o. Gender: male  Primary Care Provider: Nuala Alpha, DO Consultants: ENT Code Status: Partial   Pt Overview and Major Events to Date:  5/17: admitted, loading dose Keppra followed by Keppra 500mg  BID  5/18: ENT surgery   Assessment and Plan:  Seizure like activity No reported seizures overnight  Home meds: Keprpra 500mg  BID although reported not taking  -Continue keppra 500mg  BID -Seizure precautions -PT/OT -TOC for meds -D/c lovenox prior to surgery and restart after  HTN SBP 152-176, DBP 82-94 Home meds: HCTZ 12.5mg  once daily  -Monitor Bps  -HCTZ 25mg  once daily   Chronic pansinusitis  and nasal polyposis Patient with long history of sinus disease and obstructive nasal polyps on L side. Patient followed by [facility] and saw Dr. Erik Obey  on [phone]. Most recently patient given short burst and taper of prednisone to use when fully obstructed on 06/08/19.  CT maxillofacial: Subtotal nasal cavity, paranasal sinus, and nasopharynx opacification as seen by MRI in March. This has substantially progressed since prior paranasal sinus CT in November. -Continue nasal saline rinse for comfort prn -NPO since midnight for ENT surgery: endoscopic sinus surgery with fusion to address both sides.    New diagnosis T2DM A1c 6.9 on admission -Given relatively low A1c and other acute medical issues will defer treatment of DM to PCP -Monitor CBGs  -Continue sSSI  FEN/GI: Lovenox  PPx: n/a  Disposition: home pending PT/OPT eval after surgery   Subjective:  No acute events overnight.   Objective: Temp:  [97.8 F (36.6 C)-98 F (36.7 C)] 97.8 F (36.6 C) (05/17 2146) Pulse Rate:  [69-87] 87 (05/17 2146) Resp:  [20] 20 (05/17 2146) BP: (152-176)/(82-94) 176/94 (05/17 2146) SpO2:  [99 %-100 %] 100 % (05/17  2146)  Physical Exam: General: pleasant, unkempt 69 year old male, nasal drainage present  Cardiovascular: S1 and S2 present, RRR  Respiratory: CTAB, normal WOB  Abdomen: soft non tender, bowel sounds present Extremities: no peripheral edema   Laboratory: Recent Labs  Lab 08/01/19 1913 08/02/19 0259 08/03/19 0320  WBC 11.0* 11.5* 9.0  HGB 12.3* 11.8* 11.6*  HCT 39.4 37.7* 36.3*  PLT 464* 393 372   Recent Labs  Lab 08/01/19 1913 08/02/19 0259 08/03/19 0320  NA 139 140 137  K 4.0 3.7 3.4*  CL 104 109 102  CO2 16* 24 25  BUN 11 9 9   CREATININE 1.26* 0.91 0.95  CALCIUM 8.8* 8.7* 8.7*  PROT 7.1  --   --   BILITOT 0.4  --   --   ALKPHOS 106  --   --   ALT 15  --   --   AST 24  --   --   GLUCOSE 179* 113* 125*     Imaging/Diagnostic Tests: CT MAXILLOFACIAL WO CONTRAST  Result Date: 08/02/2019 CLINICAL DATA:  69 year old male with sinonasal polyposis, surgical planning. EXAM: CT MAXILLOFACIAL WITHOUT CONTRAST TECHNIQUE: Multidetector CT images of the paranasal sinuses were obtained using the standard protocol without intravenous contrast. COMPARISON:  Head CT yesterday, brain MRI 05/25/2019. paranasal sinus CT 02/09/2019. FINDINGS: Paranasal sinuses: Frontal: Subtotal opacification with superimposed chronic periosteal thickening. This appears stable since 06/04/2019, progressed since November. Ethmoid: Diffusely opacified and mildly expanded, stable since March and progressed since November. Maxillary: Completely opacified with mild superimposed periosteal thickening. The right maxillary sinus appears stable since November, the  left has progressed. Sphenoid: Subtotal opacification with combined mild periosteal thickening and also chronic thinning/dehiscence at the floor of the dominant left sphenoid sinus. This has progressed since November. Right ostiomeatal unit: Completely opacified. Left ostiomeatal unit: Completely opacified. Nasal passages: Completely opacified nasal cavity  with chronic thinning or dehiscence of the bony nasal septum. Associated subtotal opacification of the nasopharynx which appears stable since March, substantially progressed since November. Anatomy: Unchanged since the paranasal sinus CT 02/02/2018 (please see that report). Other: Stable visible noncontrast brain parenchyma. Calcified atherosclerosis at the skull base. No acute orbit or scalp soft tissue findings. Negative visible noncontrast deep soft tissue spaces of the face. Tympanic cavities and mastoids remain clear. Port maxillary dentition, with prior dental extractions but residual carious dentition. Chronic severe thinning or early dehiscence of bone at the floor of the sphenoid sinus as seen on sagittal image 47. This appears stable since November. Other sinus walls have a more normal appearance. IMPRESSION: 1. Subtotal nasal cavity, paranasal sinus, and nasopharynx opacification as seen by MRI in March. This has substantially progressed since prior paranasal sinus CT in November. 2. Chronic thinning or dehiscence of the bony nasal septum and the floor of the dominant left sphenoid sinus. 3. Hyperplastic sinuses, note paranasal sinus anatomy as described on 02/02/2018 (please see that report). Electronically Signed   By: Genevie Ann M.D.   On: 08/02/2019 18:55    Lattie Haw, MD 08/03/2019, 7:20 AM PGY-1, [facility] Intern pager: [phone], text pages welcome

## 2019-08-03 NOTE — Hospital Course (Addendum)
Diabetes diagnosis - A1C 6.9; however, given patient's social status, limited follow up and age, will not start any medications at this time. Discussed lifestyle changes   Ensure follow up with Dr. Redmond Baseman one week out - continue augmentin 5/19-5/25 q 12 hours. (provided via TOC pharamcy)

## 2019-08-03 NOTE — Anesthesia Preprocedure Evaluation (Addendum)
Anesthesia Evaluation   Patient awake    Reviewed: Allergy & Precautions, NPO status , Patient's Chart, lab work & pertinent test results  History of Anesthesia Complications Negative for: history of anesthetic complications  Airway Mallampati: II  TM Distance: >3 FB Neck ROM: Full    Dental  (+) Edentulous Upper, Poor Dentition, Dental Advisory Given, Missing   Pulmonary neg pulmonary ROS, former smoker,  08/01/2019 SARS coronavirus NEG   breath sounds clear to auscultation       Cardiovascular hypertension, Pt. on medications (-) angina Rhythm:Regular Rate:Normal     Neuro/Psych Seizures - (last seizure 2d ago), Poorly Controlled,  h/o seizures and had witnessed seizure-like activity while riding the bus on 5/17-  what was described as full body convulsions for approx 5 minutes  BIB EMS to ED- consulted ENT for longstanding pansinusitis which was thought to be contributing to seizures negative psych ROS   GI/Hepatic Neg liver ROS, GERD  Medicated and Controlled,  Endo/Other  negative endocrine ROS  Renal/GU negative Renal ROS  negative genitourinary   Musculoskeletal negative musculoskeletal ROS (+)   Abdominal   Peds  Hematology  (+) Blood dyscrasia, anemia , hct 36.3   Anesthesia Other Findings Nasal polyps   Reproductive/Obstetrics negative OB ROS                           Anesthesia Physical Anesthesia Plan  ASA: II  Anesthesia Plan: General   Post-op Pain Management:    Induction: Intravenous  PONV Risk Score and Plan: 2 and Ondansetron and Dexamethasone  Airway Management Planned: Oral ETT  Additional Equipment: None  Intra-op Plan:   Post-operative Plan: Extubation in OR  Informed Consent: I have reviewed the patients History and Physical, chart, labs and discussed the procedure including the risks, benefits and alternatives for the proposed anesthesia with the  patient or authorized representative who has indicated his/her understanding and acceptance.     Dental advisory given  Plan Discussed with: CRNA and Surgeon  Anesthesia Plan Comments:        Anesthesia Quick Evaluation

## 2019-08-03 NOTE — Progress Notes (Addendum)
Spoke with Dr Redmond Baseman, he explained the ENT procedure was successful. However, patient lost quite a significant amount of blood and received crystalloids during the procedure. Check CBC tomorrow. Nose was packed bilaterally and these packs will be left in for several days. Patient also started on Augmentin which he should complete a 7-day course. ENT will follow tomorrow for further recommendations.  Lattie Haw MD PGY-1, [facility]

## 2019-08-03 NOTE — Op Note (Signed)
NAME: Thomas Kline, Thomas Kline MEDICAL RECORD [id number] ACCOUNT [id number] DATE OF BIRTH:[birth year] FACILITY: [facility] LOCATION: [facility] PHYSICIAN:Murriel Holwerda D. Kimberlin Scheel, MD  OPERATIVE REPORT  DATE OF PROCEDURE:  08/03/2019  PREOPERATIVE DIAGNOSIS:  Chronic pansinusitis and nasal polyposis.  POSTOPERATIVE DIAGNOSIS:  Chronic pansinusitis and nasal polyposis.  PROCEDURES: 1.  Bilateral total ethmoidectomy. 2.  Bilateral frontal recess exploration. 3.  Bilateral maxillary antrostomy with tissue removal. 4.  Bilateral sphenoidotomy with tissue removal. 5.  Fusion image guidance.  SURGEON:  Melida Quitter, MD  ANESTHESIA:  General endotracheal anesthesia.  COMPLICATIONS:  None.  INDICATIONS:  The patient is a 69 year old male with a long history of sinus disease who has been followed by Dr. Erik Obey for a few years due to nasal polyps.  Surgery was planned a couple of years ago, but he never had surgery.  More recently, he was  admitted to the hospital due to worsening frequency of seizures and consultation was requested to proceed with sinus surgery with the possibility that the seizures are being provoked by his sinus disease.  He presents to the operating room for surgical  management.  FINDINGS:  He had extensive polyp disease filling both nasal passages fully and extending out the left nasal ala.  Polyp was found throughout all the sinuses in every location encountered.  The turbinates were not very well-defined and partially resected  through the case.  DESCRIPTION OF PROCEDURE:  The patient was identified in the holding room.  Informed consent having been obtained with discussion of risks, benefits and alternatives, the patient was brought to the operative suite and put on the operative table in the  supine position.  Anesthesia was induced and the patient was intubated by the anesthesia team without difficulty.  The patient was given intravenous antibiotics and steroids during the case.  The  eyes were lubricated and the face was prepped and draped  in sterile fashion.  The patient was then registered to the fusion image guidance system in the standard fashion with the antenna in place.  The eyes were taped closed with Steri-Strips.  Afrin pledgets were placed in both sides of the nose for several  minutes.  The right-sided pledgets were removed and the microdebrider was then used to start removing polyp tissue in the right nasal ala and nasal passage until the middle meatus region was estimated.  Local anesthetic was injected into that area in the  lateral nasal wall.  Further debridement was performed through the length of the nasal passage, removing polyp tissue back to the nasopharynx.  Dissection continued up until the middle turbinate was encountered and it was in a medialized position.   Polyp was removed from the superior meatus and olfactory cleft using the microdebrider.  The middle meatus was then dissected with the microdebrider, removing polyp tissue from the ethmoid through the ethmoid cavity, removing septations in the posterior  ethmoid.  The image guidance system was used for assistance.  After the sphenoid rostrum was approached and the anterior skull base was dissected, an angled telescope was used to evaluate the lateral nasal wall.  The maxillary ostium was found to be  widened and was further widened with the curved microdebrider, also removing polyp tissue from within the sinus.  The 0-degree telescope was used then to evaluate the sphenoid rostrum after lateralizing the middle turbinate again and the superior  turbinate was partially removed with microdebrider and the natural ostium was identified and widened superiorly and laterally, removing polypoid tissue from within the sphenoid as  well.  An angled telescope was then used to evaluate the frontal recess,  which was actually widened by the polyp tissue and was then further dissected, removing polyp tissue up into the  frontal recess, leaving it widely patent.  The same procedure was carried out on the opposite side.  To accomplish the surgery, Afrin-soaked  pledgets were placed intermittently in each side in surgery, alternating from one side to the other to try to minimize blood loss.  After completing both sides, the nasal passages were suctioned.  A Merocel pack coated in Bactroban ointment was then  placed in each nasal passage and saturated with saline.  The throat was then suctioned and he was returned to anesthesia for wakeup.  He was extubated and taken to the recovery room in stable condition.  VN/NUANCE  D:08/03/2019 T:08/03/2019 JOB:[id number]

## 2019-08-04 DIAGNOSIS — D62 Acute posthemorrhagic anemia: Secondary | ICD-10-CM

## 2019-08-04 LAB — BASIC METABOLIC PANEL
Anion gap: 9 (ref 5–15)
BUN: 16 mg/dL (ref 8–23)
CO2: 26 mmol/L (ref 22–32)
Calcium: 8.4 mg/dL — ABNORMAL LOW (ref 8.9–10.3)
Chloride: 104 mmol/L (ref 98–111)
Creatinine, Ser: 1.21 mg/dL (ref 0.61–1.24)
GFR calc Af Amer: >60 mL/min (ref >60)
GFR calc non Af Amer: >60 mL/min (ref >60)
Glucose, Bld: 165 mg/dL — ABNORMAL HIGH (ref 70–99)
Potassium: 4.8 mmol/L (ref 3.5–5.1)
Sodium: 139 mmol/L (ref 135–145)

## 2019-08-04 LAB — CBC
HCT: 30.2 % — ABNORMAL LOW (ref 39.0–52.0)
Hemoglobin: 9.6 g/dL — ABNORMAL LOW (ref 13.0–17.0)
MCH: 28.2 pg (ref 26.0–34.0)
MCHC: 31.8 g/dL (ref 30.0–36.0)
MCV: 88.8 fL (ref 80.0–100.0)
Platelets: 385 10*3/uL (ref 150–400)
RBC: 3.4 MIL/uL — ABNORMAL LOW (ref 4.22–5.81)
RDW: 13.8 % (ref 11.5–15.5)
WBC: 12.2 10*3/uL — ABNORMAL HIGH (ref 4.0–10.5)
nRBC: 0 % (ref 0.0–0.2)

## 2019-08-04 LAB — GLUCOSE, CAPILLARY
Glucose-Capillary: 127 mg/dL — ABNORMAL HIGH (ref 70–99)
Glucose-Capillary: 161 mg/dL — ABNORMAL HIGH (ref 70–99)
Glucose-Capillary: 118 mg/dL — ABNORMAL HIGH (ref 70–99)
Glucose-Capillary: 129 mg/dL — ABNORMAL HIGH (ref 70–99)

## 2019-08-04 MED ORDER — AMOXICILLIN-POT CLAVULANATE 875-125 MG PO TABS: 1.0000 | ORAL_TABLET | Freq: Two times a day (BID) | ORAL | Status: DC

## 2019-08-04 MED ORDER — AMOXICILLIN-POT CLAVULANATE 875-125 MG PO TABS
1.0000 | ORAL_TABLET | Freq: Two times a day (BID) | ORAL | Status: DC
  Administered 2019-08-04 – 2019-08-06 (×5): 1 via ORAL
  Filled 2019-08-04 (×5): qty 1

## 2019-08-04 MED ORDER — HYDROCODONE-ACETAMINOPHEN 5-325 MG PO TABS: 1.0000 | ORAL_TABLET | Freq: Four times a day (QID) | ORAL | Status: DC | PRN

## 2019-08-04 MED ORDER — SALINE SPRAY 0.65 % NA SOLN
2.0000 | NASAL | Status: DC
  Administered 2019-08-04 – 2019-08-06 (×10): 2 via NASAL
  Filled 2019-08-04: qty 44

## 2019-08-04 NOTE — Progress Notes (Signed)
   Subjective:    Patient ID: Thomas Kline, male    DOB: 08-26-1950, 69 y.o.   MRN: [id number]  HPI Doing well.  No significant pain or bleeding.  Review of Systems     Objective:   Physical Exam AF VSS Alert, NAD Bilateral packs in place     Assessment & Plan:  Chronic pansinusitis and nasal polyposis s/p endoscopic sinus surgery  Doing well.  Can be discharged when medically stable.  Will remove packs in office Monday.  Please discharge him on Augmentin for 7 days.

## 2019-08-04 NOTE — TOC Progression Note (Signed)
Transition of Care [facility]) - Progression Note    Patient Details  Name: Thomas Kline MRN: [id number] Date of Birth: 1950-10-30  Transition of Care [facility] CM/SW [place], [place] Phone Number: 08/04/2019, 3:45 PM  Clinical Narrative:     Met with pt for SNF consult. Pt is apprehensive about SNF but agrees for CSW to send bed requests. Pt consents to CSW contacting friend Elberta Fortis. FL2 completed and bed requests sent. Pt does not have a SSN listed. He states he has a SSN that he does not remember but card is at home. CSW called friend Nadara Mode expresses willingness to help in any way he can. Will need to obtain SSN for  PASSR       Expected Discharge Plan and Services                                                 Social Determinants of Health (SDOH) Interventions    Readmission Risk Interventions No flowsheet data found.

## 2019-08-04 NOTE — Anesthesia Postprocedure Evaluation (Signed)
Anesthesia Post Note  Patient: Thomas Kline  Procedure(s) Performed: Bilateral total Ethmoidectomy (Bilateral Nose) Bilateral Maxillary Antrostomy with tissue removal (Bilateral Nose) Bilateral Frontal Sinus Exploration (Bilateral Nose) Bilateral Sphenoidectomy with tissue removal (Bilateral Nose) Bilateral Computer Assisted Image Guide Navigation (Bilateral Nose)     Patient location during evaluation: PACU Anesthesia Type: General Level of consciousness: awake and alert Pain management: pain level controlled Vital Signs Assessment: post-procedure vital signs reviewed and stable Respiratory status: spontaneous breathing, nonlabored ventilation and respiratory function stable Cardiovascular status: blood pressure returned to baseline, stable and tachycardic Postop Assessment: no apparent nausea or vomiting Anesthetic complications: no    Last Vitals:  Vitals:   08/03/19 2336 08/04/19 0800  BP: 119/73 132/80  Pulse: (!) 103 92  Resp: 20 19  Temp: 37.3 C 36.8 C  SpO2: 97% 96%    Last Pain:  Vitals:   08/04/19 0800  TempSrc: Oral  PainSc: 0-No pain                 Audry Pili

## 2019-08-04 NOTE — Progress Notes (Signed)
Family Medicine Teaching Service Daily Progress Note Intern Pager: [phone]  Patient name: Thomas Kline Medical record number: [id number] Date of birth: 1951/02/11 Age: 69 y.o. Gender: male  Primary Care Provider: Nuala Alpha, DO Consultants: ENT Code Status: Partial   Pt Overview and Major Events to Date:  5/17: admitted, loading dose Keppra followed by Keppra 500mg  BID  5/18: ENT surgery   Assessment and Plan:  Seizure like activity No reported seizures overnight. Home meds: Keprpra 500mg  BID although reported not taking  -Continue keppra 500mg  BID -Seizure precautions -PT/OT -TOC for meds -SCDs -D/c lovenox prior to surgery and restart after  HTN BP 117/80 Home meds: HCTZ 12.5mg  once daily  -Monitor Bps  -HCTZ 25mg  once daily   Chronic pansinusitis  and nasal polyposis CT maxillofacial: Subtotal nasal cavity, paranasal sinus, and nasopharynx opacification as seen by MRI in March. This has substantially progressed since prior paranasal sinus CT in November. Hb 9.6 today, stable from 9.5 on 5/18 POD#1 Bilateral total Ethmoidectomy, Augmentin (5/19-) -ENT following, patient recommendations -Monitor nasal packs -Continue Augmentin for 7 days -Continue nasal saline rinse for comfort prn  New diagnosis T2DM A1c 6.9 on admission CBGs 192, 110 -Given relatively low A1c and other acute medical issues will defer treatment of DM to PCP -Monitor CBGs  -Continue sSSI  FEN/GI: Lovenox  PPx: n/a  Disposition: home pending PT/OPT eval after surgery   Subjective:  Doing well. Denies concerns this morning.  Objective: Temp:  [97.8 F (36.6 C)-99.2 F (37.3 C)] 99.2 F (37.3 C) (05/18 2336) Pulse Rate:  [90-112] 103 (05/18 2336) Resp:  [8-20] 20 (05/18 2336) BP: (117-141)/(70-83) 119/73 (05/18 2336) SpO2:  [91 %-100 %] 97 % (05/18 2336)  Physical Exam:  General: Alert, unkempt 69 yr old male  Cardio: Normal S1 and S2, RRR Pulm: CTAB, normal WOB Abdomen:  Bowel sounds normal. Abdomen soft and non-tender.  Extremities: No peripheral edema. Warm/ well perfused.   Neuro: Cranial nerves grossly intact  Laboratory: Recent Labs  Lab 08/02/19 0259 08/02/19 0259 08/03/19 0320 08/03/19 1654 08/04/19 0222  WBC 11.5*  --  9.0  --  12.2*  HGB 11.8*   < > 11.6* 9.5* 9.6*  HCT 37.7*   < > 36.3* 28.0* 30.2*  PLT 393  --  372  --  385   < > = values in this interval not displayed.   Recent Labs  Lab 08/01/19 1913 08/01/19 1913 08/02/19 0259 08/02/19 0259 08/03/19 0320 08/03/19 1654 08/04/19 0222  NA 139   < > 140   < > 137 137 139  K 4.0   < > 3.7   < > 3.4* 4.1 4.8  CL 104   < > 109   < > 102 101 104  CO2 16*   < > 24  --  25  --  26  BUN 11   < > 9   < > 9 11 16   CREATININE 1.26*   < > 0.91   < > 0.95 1.00 1.21  CALCIUM 8.8*   < > 8.7*  --  8.7*  --  8.4*  PROT 7.1  --   --   --   --   --   --   BILITOT 0.4  --   --   --   --   --   --   ALKPHOS 106  --   --   --   --   --   --   ALT 15  --   --   --   --   --   --  AST 24  --   --   --   --   --   --   GLUCOSE 179*   < > 113*   < > 125* 149* 165*   < > = values in this interval not displayed.     Imaging/Diagnostic Tests: No results found.  Lattie Haw, MD 08/04/2019, 7:36 AM PGY-1, [place] Intern pager: [phone], text pages welcome

## 2019-08-04 NOTE — Evaluation (Signed)
Physical Therapy Evaluation Patient Details Name: Thomas Kline MRN: [id number] DOB: 09/04/1950 Today's Date: 08/04/2019   History of Present Illness  Pt is 69 yo male admitted with witnessed seizure.  Additionally, during this admission pt s/p sinus surgery to address chronic pansinusitis and nasal polyposis on 08/03/19.  PMH is significant for seizure disorder, pansinusitis and obstructing polyposis, HTN, GERD.  Clinical Impression  Pt admitted with above diagnosis. Pt demonstrating good strength but decreased safety awareness, balance, and mobility.  Pt with impulsiveness and some confusion that limited mobility.  He was able to ambulate 300' but with min guard for balance and 2 LOB while standing in room.  Pt reports that he lives alone.  No family/friend present to determine baseline cognition.  At this time pt unsafe to return home alone from PT perspective but will cont to further assess as he participates with PT.  Pt currently with functional limitations due to the deficits listed below (see PT Problem List). Pt will benefit from skilled PT to increase their independence and safety with mobility to allow discharge to the venue listed below.       Follow Up Recommendations SNF;Supervision/Assistance - 24 hour    Equipment Recommendations  Rolling walker with 5" wheels    Recommendations for Other Services       Precautions / Restrictions Precautions Precautions: Fall Restrictions Weight Bearing Restrictions: No      Mobility  Bed Mobility Overal bed mobility: Independent Bed Mobility: Supine to Sit     Supine to sit: Supervision     General bed mobility comments: for safety  Transfers Overall transfer level: Needs assistance Equipment used: None Transfers: Sit to/from Stand Sit to Stand: Min guard         General transfer comment: Pt unsteady, impulsive with unsafe stand - sit transitions  Ambulation/Gait Ambulation/Gait assistance: Min guard;Min assist Gait  Distance (Feet): 300 Feet Assistive device: None Gait Pattern/deviations: Step-through pattern;Scissoring;Narrow base of support;Drifts right/left     General Gait Details: Pt with unsteady gait pattern with occasional scissoring.  No LOB with walking forward but when just standing in room pt would stumble backwards requiring assist.  Required min guard at all times and min A for 2 LOB in room.  Stairs            Wheelchair Mobility    Modified Rankin (Stroke Patients Only)       Balance Overall balance assessment: Needs assistance Sitting-balance support: No upper extremity supported;Feet supported Sitting balance-Leahy Scale: Good     Standing balance support: During functional activity;No upper extremity supported Standing balance-Leahy Scale: Fair Standing balance comment: Pt requiring min guard at all times; had 2 LOB posteriorly while standin gin room               High Level Balance Comments: Attempted head turns and changing speeds but pt not following commands.  He did step over item with min A for balance.             Pertinent Vitals/Pain Pain Assessment: No/denies pain    Home Living Family/patient expects to be discharged to:: Private residence Living Arrangements: Alone Available Help at Discharge: Available PRN/intermittently Type of Home: Apartment Home Access: Level entry     Home Layout: One level Home Equipment: None      Prior Function Level of Independence: Independent         Comments: pt reports that he was independent with ADLs/selfcare, home mgt and uses no AD for mobility;  does not drive; independent with IADLs and shopping     Hand Dominance        Extremity/Trunk Assessment   Upper Extremity Assessment Upper Extremity Assessment: Defer to OT evaluation    Lower Extremity Assessment Lower Extremity Assessment: Overall WFL for tasks assessed    Cervical / Trunk Assessment Cervical / Trunk Assessment: Normal   Communication   Communication: No difficulties  Cognition Arousal/Alertness: Awake/alert Behavior During Therapy: Impulsive Overall Cognitive Status: No family/caregiver present to determine baseline cognitive functioning Area of Impairment: Attention;Memory;Following commands;Safety/judgement;Awareness;Problem solving                     Memory: Decreased short-term memory Following Commands: Follows one step commands inconsistently Safety/Judgement: Decreased awareness of safety;Decreased awareness of deficits   Problem Solving: Requires verbal cues;Requires tactile cues General Comments: Pt extremely impulsive and unaware of unsteadiness (examples: PT was assiting pt in changing gown and pt stood up naked and began walking around room.  And, pt starting conversations with people in hall like he knew them but he had never met them).  Required max cues for safety.  Pt was oriented x 4 but responses to other questions/commands were not always appropriate      General Comments      Exercises     Assessment/Plan    PT Assessment Patient needs continued PT services  PT Problem List Decreased mobility;Decreased strength;Decreased safety awareness;Decreased coordination;Decreased activity tolerance;Decreased cognition;Decreased balance;Decreased knowledge of use of DME       PT Treatment Interventions DME instruction;Therapeutic activities;Cognitive remediation;Gait training;Therapeutic exercise;Patient/family education;Stair training;Balance training;Functional mobility training;Neuromuscular re-education    PT Goals (Current goals can be found in the Care Plan section)  Acute Rehab PT Goals Patient Stated Goal: go home PT Goal Formulation: With patient Time For Goal Achievement: 08/18/19 Potential to Achieve Goals: Good Additional Goals Additional Goal #1: DGI score of >19 to indicate low fall risk    Frequency Min 3X/week   Barriers to discharge Decreased caregiver  support      Co-evaluation               AM-PAC PT "6 Clicks" Mobility  Outcome Measure Help needed turning from your back to your side while in a flat bed without using bedrails?: None Help needed moving from lying on your back to sitting on the side of a flat bed without using bedrails?: None Help needed moving to and from a bed to a chair (including a wheelchair)?: A Little Help needed standing up from a chair using your arms (e.g., wheelchair or bedside chair)?: A Little Help needed to walk in hospital room?: A Little Help needed climbing 3-5 steps with a railing? : A Little 6 Click Score: 20    End of Session Equipment Utilized During Treatment: Gait belt Activity Tolerance: Patient tolerated treatment well Patient left: with chair alarm set;in chair;with call bell/phone within reach Nurse Communication: Mobility status PT Visit Diagnosis: Unsteadiness on feet (R26.81)    Time: 1005-1030 PT Time Calculation (min) (ACUTE ONLY): 25 min   Charges:   PT Evaluation $PT Eval Moderate Complexity: 1 Mod PT Treatments $Gait Training: 8-22 mins        Maggie Font, PT Acute Rehab Services Pager [phone] [facility] [phone] [facility] [place] 08/04/2019, 11:30 AM

## 2019-08-04 NOTE — Progress Notes (Signed)
Occupational Therapy Treatment Patient Details Name: Thomas Kline MRN: [id number] DOB: 03/05/1951 Today's Date: 08/04/2019    History of present illness Pt is 69 yo male admitted with witnessed seizure.  Additionally, during this admission pt s/p sinus surgery to address chronic pansinusitis and nasal polyposis on 08/03/19.  PMH is significant for seizure disorder, pansinusitis and obstructing polyposis, HTN, GERD.   OT comments  Progress noted. Pt attempting Pillbox Test today for cognitive awareness, sequencing and memory. Pt showing deficits in all areas listed. Pt unable to fill in 1 week completely with 1 pill container. Pt given multimodal cues, but unable to perform task successfully. Pt constantly grabbing 1 "pill" at a time, placing it in pill box and rereading directions for next pill. Pt unable to perform medication management at this time.  ** OT updating d/c plan for SNF as pt unsafe and unable to follow commands. Goals updated.   Follow Up Recommendations  SNF;Supervision/Assistance - 24 hour    Equipment Recommendations  Other (comment)(defer to next facility)    Recommendations for Other Services      Precautions / Restrictions Precautions Precautions: Fall       Mobility Bed Mobility Overal bed mobility: Independent Bed Mobility: Supine to Sit     Supine to sit: Supervision     General bed mobility comments: for safety  Transfers                 General transfer comment: seated for cognitive ADL task    Balance Overall balance assessment: Needs assistance Sitting-balance support: No upper extremity supported;Feet supported Sitting balance-Leahy Scale: Good                                     ADL either performed or assessed with clinical judgement   ADL Overall ADL's : Needs assistance/impaired     Grooming: Wash/dry face;Wash/dry hands;Set up;Sitting                               Functional mobility during  ADLs: Min guard General ADL Comments: Pt failed pillbox test due to poor cognition in awareness, sequencing and memory. Pt unsafe to be home alone.      Vision   Vision Assessment?: No apparent visual deficits Additional Comments: read labels aloud without error   Perception     Praxis      Cognition Arousal/Alertness: Awake/alert Behavior During Therapy: WFL for tasks assessed/performed Overall Cognitive Status: No family/caregiver present to determine baseline cognitive functioning Area of Impairment: Attention;Memory;Following commands;Safety/judgement;Awareness;Problem solving                   Current Attention Level: Sustained Memory: Decreased short-term memory Following Commands: Follows one step commands inconsistently;Follows one step commands with increased time Safety/Judgement: Decreased awareness of safety;Decreased awareness of deficits Awareness: Emergent Problem Solving: Slow processing;Difficulty sequencing;Requires verbal cues General Comments: Pt attempting Pillbox Test today for cognitive awareness, sequencing and memory. Pt showing deficits in all areas listed. Pt unable to fill in 1 week completely with 1 pill container. Pt given multimodal cues, but unable to perform task successfully. Pt constantly grabbing 1 "pill" at a time, placing it in pill box and rereading directions for next pill. Pt unable to perform medication management at this time.        Exercises     Shoulder Instructions  General Comments OT recommendations shall downgraded to SNF for safety as pt lives alone.    Pertinent Vitals/ Pain       Pain Assessment: No/denies pain  Home Living                                          Prior Functioning/Environment              Frequency  Min 2X/week        Progress Toward Goals  OT Goals(current goals can now be found in the care plan section)  Progress towards OT goals: Progressing toward  goals  Acute Rehab OT Goals Patient Stated Goal: go home OT Goal Formulation: With patient Time For Goal Achievement: 08/23/19 Potential to Achieve Goals: Good ADL Goals Pt Will Perform Grooming: with supervision;with set-up;standing Pt Will Perform Lower Body Bathing: with supervision;with set-up Pt Will Perform Lower Body Dressing: with supervision;with set-up Pt Will Transfer to Toilet: with supervision;with modified independence;ambulating Pt Will Perform Toileting - Clothing Manipulation and hygiene: with supervision;with modified independence;sit to/from stand Pt Will Perform Tub/Shower Transfer: with supervision;with modified independence;ambulating;grab bars;shower seat Additional ADL Goal #1: Pt will consistently follow simple and complex commands with minimal cues.  Plan Discharge plan needs to be updated    Co-evaluation                 AM-PAC OT "6 Clicks" Daily Activity     Outcome Measure   Help from another person eating meals?: None Help from another person taking care of personal grooming?: A Little Help from another person toileting, which includes using toliet, bedpan, or urinal?: A Little Help from another person bathing (including washing, rinsing, drying)?: A Little Help from another person to put on and taking off regular upper body clothing?: A Little Help from another person to put on and taking off regular lower body clothing?: A Little 6 Click Score: 19    End of Session    OT Visit Diagnosis: Unsteadiness on feet (R26.81);Other abnormalities of gait and mobility (R26.89);Muscle weakness (generalized) (M62.81);History of falling (Z91.81)   Activity Tolerance Patient tolerated treatment well   Patient Left in bed;with call bell/phone within reach;with bed alarm set   Nurse Communication Mobility status        Time: 1555-1630 OT Time Calculation (min): 35 min  Charges: OT General Charges $OT Visit: 1 Visit OT Treatments $Cognitive  Funtion inital: Initial 15 mins $Cognitive Funtion additional: Additional15 mins  Jefferey Pica, OTR/L Acute Rehabilitation Services Pager: [phone] Office: [phone]    Treston Coker C 08/04/2019, 4:40 PM

## 2019-08-05 LAB — GLUCOSE, CAPILLARY
Glucose-Capillary: 108 mg/dL — ABNORMAL HIGH (ref 70–99)
Glucose-Capillary: 173 mg/dL — ABNORMAL HIGH (ref 70–99)
Glucose-Capillary: 107 mg/dL — ABNORMAL HIGH (ref 70–99)
Glucose-Capillary: 180 mg/dL — ABNORMAL HIGH (ref 70–99)

## 2019-08-05 LAB — CBC
HCT: 26.1 % — ABNORMAL LOW (ref 39.0–52.0)
Hemoglobin: 8.3 g/dL — ABNORMAL LOW (ref 13.0–17.0)
MCH: 28.2 pg (ref 26.0–34.0)
MCHC: 31.8 g/dL (ref 30.0–36.0)
MCV: 88.8 fL (ref 80.0–100.0)
Platelets: 315 10*3/uL (ref 150–400)
RBC: 2.94 MIL/uL — ABNORMAL LOW (ref 4.22–5.81)
RDW: 13.8 % (ref 11.5–15.5)
WBC: 8.1 10*3/uL (ref 4.0–10.5)
nRBC: 0 % (ref 0.0–0.2)

## 2019-08-05 LAB — LEVETIRACETAM LEVEL: Levetiracetam Lvl: 14 ug/mL (ref 10.0–40.0)

## 2019-08-05 MED ORDER — FERROUS SULFATE 325 (65 FE) MG PO TABS: 325.0000 mg | ORAL_TABLET | Freq: Every day | ORAL | Status: DC

## 2019-08-05 MED ORDER — FERROUS SULFATE 325 (65 FE) MG PO TABS
325.0000 mg | ORAL_TABLET | Freq: Every day | ORAL | Status: DC
  Administered 2019-08-05 – 2019-08-06 (×2): 325 mg via ORAL
  Filled 2019-08-05 (×2): qty 1

## 2019-08-05 NOTE — TOC Progression Note (Addendum)
Transition of Care [facility]) - Progression Note    Patient Details  Name: Giovani Neumeister MRN: [id number] Date of Birth: [birth year]  Transition of Care [facility]) CM/SW [place], [place] Phone Number: 08/05/2019, 2:31 PM  Clinical Narrative:     CSW met with pt to discuss SNF choice. Pt. Remains ambivalent about SNF. He responds vaguely when presented with choices. Pt. States he does not have any friends or family who can help with decision. Pt does consent to [facility] contacting his friend Standley Brooking. CSW attempts to call Elberta Fortis with pt. Present; no answer, message left. Pt then says he wants to go home. CSW explains the difference between SNF vs Home with [facility] option. Pt maintains that he wants to go home. CSW followed up with MD who will discuss further as she believes he will benefit more from SNF [facility] will continue to follow.   CSW and MD met with pt along with Standley Brooking on speaker phone. Discussed SNF vs Home with HH. Pt is agreeable to to [facility] SN[facility] after meeting with CSW, MD, and friend Elberta Fortis.   1553: Spoke with Tammy at [facility]. Confirmed bed for tomorrow.       Expected Discharge Plan and Services                                                 Social Determinants of Health (SDOH) Interventions    Readmission Risk Interventions No flowsheet data found.

## 2019-08-05 NOTE — Progress Notes (Signed)
Family Medicine Teaching Service Daily Progress Note Intern Pager: [phone]  Patient name: Thomas Kline Medical record number: [id number] Date of birth: 02-Apr-1950 Age: 69 y.o. Gender: male  Primary Care Provider: Nuala Alpha, DO Consultants: ENT Code Status: Partial   Pt Overview and Major Events to Date:  5/17: admitted, loading dose Keppra followed by Keppra 500mg  BID  5/18: ENT surgery   Assessment and Plan:  Chronic pansinusitis  and nasal polyposis CT maxillofacial: Subtotal nasal cavity, paranasal sinus, and nasopharynx opacification as seen by MRI in March. This has substantially progressed since prior paranasal sinus CT in November. POD#2 Bilateral total Ethmoidectomy, 700cc blood loss, Augmentin (5/19-) -ENT following, patient recommendations -Monitor nasal packs -Continue Augmentin for 7 days -Continue nasal saline rinse for comfort prn -PT recommended SNF  HTN BP 117/80, stable  Home meds: HCTZ 12.5mg  once daily  -Monitor Bps  -HCTZ 25mg  once daily  -Amlodipine 5mg  daily   Seizure No reported seizures overnight  Home meds: Keprpra 500mg  BID although reported not taking  -Continue keppra 500mg  BID -Seizure precautions -PT/OT -TOC for meds -SCDs -Consider restarting Lovenox if Hb stable today  Anemia due to acute blood loss  Hb 12.3>11.8>11.6>9.5>9.6 significant blood loss during surgery . Hemodynamically stable. BP 122/67, HR 81 -CBC pending  New diagnosis T2DM A1c 6.9, CBGs 129, 118 -Given relatively low A1c and other acute medical issues will defer treatment of DM to PCP -Monitor CBGs  -Continue sSSI  FEN/GI: Regular diet  PPx: SCDs  Disposition: PT recommending SNF   Subjective:  No acute events overnight  Objective: Temp:  [98 F (36.7 C)-98.3 F (36.8 C)] 98.1 F (36.7 C) (05/19 2232) Pulse Rate:  [92-98] 95 (05/19 2232) Resp:  [18-20] 20 (05/19 2232) BP: (117-132)/(67-80) 121/67 (05/19 2232) SpO2:  [96 %-98 %] 97 % (05/19  2232)  Physical Exam:  General: Alert, pleasant, no acute distress Cardio: Normal S1 and S2, RRR  Pulm: CTAB, normal WOB Abdomen: Bowel sounds normal. Abdomen soft and non-tender.  Extremities: No peripheral edema. Warm/ well perfused.   Neuro: Cranial nerves grossly intact  Laboratory: Recent Labs  Lab 08/02/19 0259 08/02/19 0259 08/03/19 0320 08/03/19 1654 08/04/19 0222  WBC 11.5*  --  9.0  --  12.2*  HGB 11.8*   < > 11.6* 9.5* 9.6*  HCT 37.7*   < > 36.3* 28.0* 30.2*  PLT 393  --  372  --  385   < > = values in this interval not displayed.   Recent Labs  Lab 08/01/19 1913 08/01/19 1913 08/02/19 0259 08/02/19 0259 08/03/19 0320 08/03/19 1654 08/04/19 0222  NA 139   < > 140   < > 137 137 139  K 4.0   < > 3.7   < > 3.4* 4.1 4.8  CL 104   < > 109   < > 102 101 104  CO2 16*   < > 24  --  25  --  26  BUN 11   < > 9   < > 9 11 16   CREATININE 1.26*   < > 0.91   < > 0.95 1.00 1.21  CALCIUM 8.8*   < > 8.7*  --  8.7*  --  8.4*  PROT 7.1  --   --   --   --   --   --   BILITOT 0.4  --   --   --   --   --   --   ALKPHOS 106  --   --   --   --   --   --  ALT 15  --   --   --   --   --   --   AST 24  --   --   --   --   --   --   GLUCOSE 179*   < > 113*   < > 125* 149* 165*   < > = values in this interval not displayed.     Imaging/Diagnostic Tests: No results found.  Lattie Haw, MD 08/05/2019, 7:25 AM PGY-1, [place] Intern pager: [phone], text pages welcome

## 2019-08-05 NOTE — Progress Notes (Signed)
Patient is medically stable for discharge to SNF when bed is available.  Thomas Haw  MD PGY-1, [place] [facility]

## 2019-08-05 NOTE — Care Management Important Message (Signed)
Important Message  Patient Details  Name: Thomas Kline MRN: [id number] Date of Birth: 1950/11/12   Medicare Important Message Given:  Yes     [name] 08/05/2019, 2:00 PM

## 2019-08-05 NOTE — Progress Notes (Addendum)
Went to bedside to speak with patient about skilled nursing facility versus home health PT OT.  LCSW Cyrus joined at bedside.  We spoke on the phone with patient's contact, Baruch Merl, who also engaged in encouraging patient to go to skilled nursing facility where he would receive physical therapy 5 times a week versus 2 times a week at home.  Mr. Nelva Nay also thought it would be important that he was in a facility where he can start getting used to a medication schedule and also provide meals at this time. Patient is ultimately agreeable.  Reviewed with patient that his stay could range anywhere from 1 to 3 weeks depending on his progression with physical therapy and Occupational Therapy.  Both patient and Mr. Nelva Nay are requesting [facility] for location convenience.  Patient will continue to follow-up at family medicine center with his PCP Dr. Garlan Fillers.  Both patient and Mr. Nelva Nay are requesting that patient receives 90-day supplies to help with medication compliance.  Expect discharge to [facility] 08/06/19  Medically stable for discharge. Patient to follow up with ENT for wound care on Monday, 5/24.   Wilber Oliphant, M.D.  3:01 PM 08/05/2019

## 2019-08-06 ENCOUNTER — Encounter (HOSPITAL_COMMUNITY): Payer: Self-pay | Admitting: Family Medicine

## 2019-08-06 LAB — CBC
HCT: 27 % — ABNORMAL LOW (ref 39.0–52.0)
Hemoglobin: 8.6 g/dL — ABNORMAL LOW (ref 13.0–17.0)
MCH: 28.1 pg (ref 26.0–34.0)
MCHC: 31.9 g/dL (ref 30.0–36.0)
MCV: 88.2 fL (ref 80.0–100.0)
Platelets: 390 10*3/uL (ref 150–400)
RBC: 3.06 MIL/uL — ABNORMAL LOW (ref 4.22–5.81)
RDW: 13.7 % (ref 11.5–15.5)
WBC: 9 10*3/uL (ref 4.0–10.5)
nRBC: 0 % (ref 0.0–0.2)

## 2019-08-06 LAB — SARS CORONAVIRUS 2 BY RT PCR (HOSPITAL ORDER, PERFORMED IN ~~LOC~~ HOSPITAL LAB): SARS Coronavirus 2: NEGATIVE

## 2019-08-06 LAB — GLUCOSE, CAPILLARY
Glucose-Capillary: 118 mg/dL — ABNORMAL HIGH (ref 70–99)
Glucose-Capillary: 127 mg/dL — ABNORMAL HIGH (ref 70–99)

## 2019-08-06 MED ORDER — LEVETIRACETAM 500 MG PO TABS: 500.0000 mg | ORAL_TABLET | Freq: Two times a day (BID) | ORAL | Status: DC

## 2019-08-06 MED ORDER — HYDROCHLOROTHIAZIDE 25 MG PO TABS: 25.0000 mg | ORAL_TABLET | Freq: Every day | ORAL | Status: DC

## 2019-08-06 MED ORDER — FERROUS SULFATE 325 (65 FE) MG PO TABS: 325.0000 mg | ORAL_TABLET | Freq: Every day | ORAL | 3 refills | Status: AC

## 2019-08-06 MED ORDER — AMLODIPINE BESYLATE 5 MG PO TABS: 5.0000 mg | ORAL_TABLET | Freq: Every day | ORAL | Status: DC

## 2019-08-06 MED ORDER — HYDROCHLOROTHIAZIDE 12.5 MG PO CAPS: 25.0000 mg | ORAL_CAPSULE | Freq: Every day | ORAL | 0 refills | Status: DC

## 2019-08-06 MED ORDER — AMOXICILLIN-POT CLAVULANATE 875-125 MG PO TABS: 1.0000 | ORAL_TABLET | Freq: Two times a day (BID) | ORAL | 0 refills | Status: AC

## 2019-08-06 MED ORDER — SALINE SPRAY 0.65 % NA SOLN: 1.0000 | NASAL | 0 refills | Status: AC | PRN

## 2019-08-06 NOTE — NC FL2 (Signed)
[place] LEVEL OF CARE SCREENING TOOL     IDENTIFICATION  Patient Name: Thomas Kline Birthdate: 12-May-1950 Sex: male Admission Date (Current Location): 08/01/2019  [facility] and [place] Number:  Herbalist and Address:  [facility], [place] [address], [place], [place] [address]      Provider Number: [id number]  Attending Physician Name and Address:  Lind Covert, MD  Relative Name and Phone Number:       Current Level of Care: Hospital Recommended Level of Care: [place] Prior Approval Number:    Date Approved/Denied:   PASRR Number: [id number]  Discharge Plan: SNF    Current Diagnoses: Patient Active Problem List   Diagnosis Date Noted  . Anemia associated with acute blood loss 08/04/2019  . Seizure ([place]) 08/02/2019  . Polyp of nasal cavity 08/02/2019  . Hypertension 08/02/2019    Orientation RESPIRATION BLADDER Height & Weight     Self, Time, Situation, Place  Normal External catheter Weight: 222 lb 10.6 oz (101 kg) Height:  6' (182.9 cm)  BEHAVIORAL SYMPTOMS/MOOD NEUROLOGICAL BOWEL NUTRITION STATUS    Convulsions/Seizures Continent Diet(see discharge summary)  AMBULATORY STATUS COMMUNICATION OF NEEDS Skin   Extensive Assist Verbally Surgical wounds(Incision on nose)                       Personal Care Assistance Level of Assistance  Feeding, Dressing, Bathing Bathing Assistance: Independent Feeding assistance: Independent Dressing Assistance: Limited assistance     Functional Limitations Info  Sight, Speech, Hearing Sight Info: Adequate Hearing Info: Adequate Speech Info: Adequate    SPECIAL CARE FACTORS FREQUENCY  PT (By licensed PT), OT (By licensed OT)     PT Frequency: 5x/week OT Frequency: 5x/week            Contractures Contractures Info: Not present    Additional Factors Info  Code Status, Allergies Code Status Info: Partial Code Allergies Info: NKA            Current Medications (08/06/2019):  This is the current hospital active medication list Current Facility-Administered Medications  Medication Dose Route Frequency Provider Last Rate Last Admin  . acetaminophen (TYLENOL) tablet 650 mg  650 mg Oral Q6H PRN Melida Quitter, MD       Or  . acetaminophen (TYLENOL) suppository 650 mg  650 mg Rectal Q6H PRN Melida Quitter, MD      . amLODipine (NORVASC) tablet 5 mg  5 mg Oral Daily Melida Quitter, MD   5 mg at 08/05/19 0957  . amoxicillin-clavulanate (AUGMENTIN) 875-125 MG per tablet 1 tablet  1 tablet Oral Q12H Lattie Haw, MD   1 tablet at 08/05/19 2121  . ferrous sulfate tablet 325 mg  325 mg Oral Q breakfast Lind Covert, MD   325 mg at 08/06/19 0600  . hydrochlorothiazide (HYDRODIURIL) tablet 25 mg  25 mg Oral Daily Melida Quitter, MD   25 mg at 08/05/19 0958  . HYDROcodone-acetaminophen (NORCO/VICODIN) 5-325 MG per tablet 1-2 tablet  1-2 tablet Oral Q6H PRN Melida Quitter, MD      . levETIRAcetam (KEPPRA) tablet 500 mg  500 mg Oral BID Melida Quitter, MD   500 mg at 08/05/19 2121  . sodium chloride (OCEAN) 0.65 % nasal spray 1 spray  1 spray Each Nare PRN Melida Quitter, MD      . sodium chloride (OCEAN) 0.65 % nasal spray 2 spray  2 spray Each Nare Q4H while awake Redmond Baseman,  Orpah Greek, MD   2 spray at 08/06/19 [id number]     Discharge Medications: Please see discharge summary for a list of discharge medications.  Relevant Imaging Results:  Relevant Lab Results:   Additional Information [employer],

## 2019-08-06 NOTE — TOC Transition Note (Addendum)
Transition of Care [facility]) - CM/SW Discharge Note   Patient Details  Name: Thomas Kline MRN: [id number] Date of Birth: Jul 25, 1950  [facility]) CM/SW Contact:  Bethann Berkshire, [place] Phone Number: 08/06/2019, 10:31 AM   Clinical Narrative:     Patient will DC to: [facility] Anticipated DC date: 08/06/19 Family notified: Hermelinda Dellen (Friend)  [phone] [facility]) Transport by: Corey Harold   Per MD patient ready for DC to [place]. RN, patient, patient's family, and facility notified of DC. Discharge Summary and FL2 sent to facility. RN to call report prior to discharge ([id number] Room [id number]). DC packet on chart. Ambulance transport requested for patient for 1130.   CSW will sign off for now as social work intervention is no longer needed. Please consult [place] again if new needs arise.   Final next level of care: Skilled Nursing Facility Barriers to Discharge: No Barriers Identified   Patient Goals and CMS Choice Patient states their goals for this hospitalization and ongoing recovery are:: [facility] SNF CMS Medicare.gov Compare Post Acute Care list provided to:: Patient    Discharge Placement              Patient chooses bed at: [facility]) Patient to be transferred to facility by: [place] Name of family member notified: Hermelinda Dellen (Friend) [phone] [facility]) Patient and family notified of of transfer: 08/06/19  Discharge Plan and Services                                     Social Determinants of Health (SDOH) Interventions     Readmission Risk Interventions No flowsheet data found.

## 2019-08-09 LAB — SURGICAL PATHOLOGY

## 2021-03-20 ENCOUNTER — Emergency Department (HOSPITAL_COMMUNITY): Payer: Medicare Other

## 2021-03-20 ENCOUNTER — Encounter (HOSPITAL_COMMUNITY): Payer: Self-pay

## 2021-03-20 ENCOUNTER — Emergency Department (HOSPITAL_COMMUNITY)
Admission: EM | Admit: 2021-03-20 | Discharge: 2021-03-20 | Discharge status: Absent without leave | Payer: Medicare Other | Attending: Emergency Medicine | Admitting: Emergency Medicine

## 2021-03-20 ENCOUNTER — Emergency Department (HOSPITAL_COMMUNITY)
Admission: EM | Admit: 2021-03-20 | Discharge: 2021-03-20 | Disposition: A | Discharge status: Home / Self Care | Payer: Medicare Other | Attending: Emergency Medicine | Admitting: Emergency Medicine

## 2021-03-20 ENCOUNTER — Other Ambulatory Visit: Payer: Self-pay

## 2021-03-20 DIAGNOSIS — W208XXA Other cause of strike by thrown, projected or falling object, initial encounter: Secondary | ICD-10-CM | POA: Insufficient documentation

## 2021-03-20 DIAGNOSIS — Z79899 Other long term (current) drug therapy: Secondary | ICD-10-CM | POA: Diagnosis not present

## 2021-03-20 DIAGNOSIS — R569 Unspecified convulsions: Secondary | ICD-10-CM

## 2021-03-20 DIAGNOSIS — G40909 Epilepsy, unspecified, not intractable, without status epilepticus: Secondary | ICD-10-CM

## 2021-03-20 DIAGNOSIS — R410 Disorientation, unspecified: Secondary | ICD-10-CM | POA: Insufficient documentation

## 2021-03-20 DIAGNOSIS — G40911 Epilepsy, unspecified, intractable, with status epilepticus: Secondary | ICD-10-CM | POA: Diagnosis present

## 2021-03-20 LAB — CBC
HCT: 36.6 % — ABNORMAL LOW (ref 39.0–52.0)
Hemoglobin: 11.3 g/dL — ABNORMAL LOW (ref 13.0–17.0)
MCH: 25.3 pg — ABNORMAL LOW (ref 26.0–34.0)
MCHC: 30.9 g/dL (ref 30.0–36.0)
MCV: 82.1 fL (ref 80.0–100.0)
Platelets: 365 10*3/uL (ref 150–400)
RBC: 4.46 MIL/uL (ref 4.22–5.81)
RDW: 16.9 % — ABNORMAL HIGH (ref 11.5–15.5)
WBC: 5.7 10*3/uL (ref 4.0–10.5)
nRBC: 0 % (ref 0.0–0.2)

## 2021-03-20 LAB — COMPREHENSIVE METABOLIC PANEL
ALT: 16 U/L (ref 0–44)
AST: 23 U/L (ref 15–41)
Albumin: 3.6 g/dL (ref 3.5–5.0)
Alkaline Phosphatase: 91 U/L (ref 38–126)
Anion gap: 12 (ref 5–15)
BUN: 11 mg/dL (ref 8–23)
CO2: 22 mmol/L (ref 22–32)
Calcium: 8.9 mg/dL (ref 8.9–10.3)
Chloride: 103 mmol/L (ref 98–111)
Creatinine, Ser: 1.22 mg/dL (ref 0.61–1.24)
GFR, Estimated: >60 mL/min (ref >60)
Glucose, Bld: 160 mg/dL — ABNORMAL HIGH (ref 70–99)
Potassium: 3.6 mmol/L (ref 3.5–5.1)
Sodium: 137 mmol/L (ref 135–145)
Total Bilirubin: 0.7 mg/dL (ref 0.3–1.2)
Total Protein: 7.1 g/dL (ref 6.5–8.1)

## 2021-03-20 LAB — ETHANOL: Alcohol, Ethyl (B): <10 mg/dL (ref <10)

## 2021-03-20 MED ORDER — LEVETIRACETAM 500 MG PO TABS: 500.0000 mg | ORAL_TABLET | Freq: Two times a day (BID) | ORAL | 2 refills | Status: DC

## 2021-03-20 MED ORDER — LEVETIRACETAM IN NACL 1500 MG/100ML IV SOLN
1500.0000 mg | Freq: Once | INTRAVENOUS | Status: AC
  Administered 2021-03-20: 1500 mg via INTRAVENOUS
  Filled 2021-03-20: qty 100

## 2021-03-20 MED ORDER — HYDROCHLOROTHIAZIDE 25 MG PO TABS: 25.0000 mg | ORAL_TABLET | Freq: Every day | ORAL | 2 refills | Status: DC

## 2021-03-20 MED ORDER — ONDANSETRON HCL 4 MG/2ML IJ SOLN: 4.0000 mg | Freq: Once | INTRAMUSCULAR | Status: DC

## 2021-03-20 MED ORDER — LORAZEPAM 2 MG/ML IJ SOLN
1.0000 mg | Freq: Once | INTRAMUSCULAR | Status: AC
Start: 2021-03-20 — End: 2021-03-20
  Administered 2021-03-20: 1 mg via INTRAVENOUS
  Filled 2021-03-20: qty 1

## 2021-03-20 MED ORDER — AMLODIPINE BESYLATE 5 MG PO TABS: 5.0000 mg | ORAL_TABLET | Freq: Every day | ORAL | 2 refills | Status: DC

## 2021-03-20 NOTE — ED Notes (Signed)
Patient transported to CT 

## 2021-03-20 NOTE — Care Management (Signed)
Received call from [facility] concerning patient needing transportation to return home. Patient is ambulatory according to ED RN, and would be appropriate for [facility]. Patient consent to [facility] transport. Verified address and information concerning the driver time of ride.

## 2021-03-20 NOTE — ED Triage Notes (Signed)
Pt BIB [facility] for eval of seizure. Pt was found face down in bus stop. Witness states that he was face down and "shaking for a bit" but did not pay attention to the seizure episode. Pt altered on arrival, abrasion to face/nose. On EMS arrival, pt was face down and snoring respirations which improved w. Positional change. Pt altered on arrival to ED.

## 2021-03-20 NOTE — ED Provider Notes (Signed)
Assumed care of the patient at 3:30 PM from Dr. Jeanell Sparrow.  Patient was noted to have a seizure today at the bus stop.  Initially postictal upon arrival here.  Patient now is awake and seems to be alert.  However does seem to have somewhat of a language barrier.  Patient does not have any family or friends present.  Attempted to call patient's friend who has been contacted in the past during hospitalization but nobody has called back.  I have consulted transition of care team with social work to try to figure out if patient is stable for discharge home.  He is in no acute distress at this time.  He does interact appropriately however unable to understand some of what he says.  He is eating and drinking at this time.  He reports that he has a legal guardian however it is unclear who that might be.  10:11 PM Pt is now dressing.  He confirmed his address.  Will get pt an uber home.   Blanchie Dessert, MD 03/20/21 2212

## 2021-03-20 NOTE — ED Notes (Signed)
Pt found sitting on end of bed.  Pt encouraged back into bed and assisted with getting food together so he can eat

## 2021-03-20 NOTE — ED Notes (Signed)
Dc instructions reviewed with pt. PT verbalized understanding. Pt DC.  °

## 2021-03-20 NOTE — ED Triage Notes (Deleted)
Pt BIB [facility] for eval of seizure. Pt was found face down in bus stop. Witness states that he was face down and "shaking for a bit" but did not pay attention to the seizure episode. Pt altered on arrival, abrasion to face/nose. On EMS arrival, pt was face down and snoring respirations which improved w. Positional change. Pt altered on arrival to ED, registered as a Doe, unsure if history of same.

## 2021-03-20 NOTE — ED Provider Notes (Signed)
[facility] EMERGENCY DEPARTMENT Provider Note   CSN: [id number] Arrival date & time: 03/20/21  1335     History  Chief Complaint  Patient presents with   Seizures    Thomas Kline is a 71 y.o. male.  HPI 71 year old male history of seizures presents today with report of seizure at a bus stop.  History obtained through nursing and EMS.  They report that he had a 2-minute grand mal seizure.  He fell and struck his face.  He has been postictal prehospital and continues confused on my initial evaluation    Home Medications Prior to Admission medications   Medication Sig Start Date End Date Taking? Authorizing Provider  amLODipine (NORVASC) 5 MG tablet Take 1 tablet (5 mg total) by mouth daily. 08/06/19   Lattie Haw, MD  ferrous sulfate 325 (65 FE) MG tablet Take 1 tablet (325 mg total) by mouth daily with breakfast. 08/06/19   Lattie Haw, MD  fluticasone (FLONASE) 50 MCG/ACT nasal spray Place 2 sprays into both nostrils daily. Patient not taking: Reported on 06/04/2019 10/06/17   Nuala Alpha, MD  fluticasone [facility]) 50 MCG/ACT nasal spray Place 2 sprays into both nostrils daily as needed for allergies or rhinitis.    [provider]  hydrochlorothiazide (HYDRODIURIL) 25 MG tablet Take 1 tablet (25 mg total) by mouth daily. 08/06/19   Wilber Oliphant, MD  hydrochlorothiazide (MICROZIDE) 12.5 MG capsule TAKE 1 CAPSULE(12.5 MG) BY MOUTH DAILY 07/06/19   Nuala Alpha, MD  hydrochlorothiazide (MICROZIDE) 12.5 MG capsule Take 2 capsules (25 mg total) by mouth daily. 08/06/19 09/05/19  Lattie Haw, MD  levETIRAcetam (KEPPRA) 500 MG tablet Take 1 tablet (500 mg total) by mouth 2 (two) times daily. 06/04/19   Meccariello, Bernita Raisin, DO  levETIRAcetam (KEPPRA) 500 MG tablet Take 1 tablet (500 mg total) by mouth 2 (two) times daily. 08/06/19   Lattie Haw, MD  loratadine (CLARITIN) 10 MG tablet Take 1 tablet (10 mg total) by mouth daily. Patient not taking:  Reported on 06/04/2019 10/23/17   Nuala Alpha, MD  montelukast (SINGULAIR) 10 MG tablet Take 1 tablet (10 mg total) by mouth at bedtime. Patient not taking: Reported on 06/04/2019 10/23/17   Nuala Alpha, MD  omeprazole (PRILOSEC) 20 MG capsule Take 20 mg by mouth 2 (two) times daily. 07/07/19   [provider]  pantoprazole (PROTONIX) 20 MG tablet Take 1 tablet (20 mg total) by mouth 2 (two) times daily. Patient not taking: Reported on 06/04/2019 10/06/17   Nuala Alpha, MD  sodium chloride (OCEAN) 0.65 % SOLN nasal spray Place 1 spray into both nostrils as needed for congestion. 08/06/19   Wilber Oliphant, MD      Allergies    Patient has no known allergies.    Review of Systems   Review of Systems  Unable to perform ROS: Mental status change   Physical Exam Updated Vital Signs BP (!) 160/87    Pulse 85    Temp 97.8 F (36.6 C)    Resp (!) 24    Ht 1.829 m (6')    Wt 101 kg    SpO2 94%    BMI 30.20 kg/m  Physical Exam Vitals and nursing note reviewed.  Constitutional:      General: He is not in acute distress.    Appearance: He is ill-appearing.  HENT:     Head: Normocephalic.     Comments: Contusions and abrasions of face    Right Ear: External  ear normal.     Left Ear: External ear normal.     Nose: Nose normal.  Cardiovascular:     Rate and Rhythm: Regular rhythm. Tachycardia present.  Pulmonary:     Effort: Pulmonary effort is normal.  Neurological:     General: No focal deficit present.     Mental Status: He is alert.     Comments: Patient is fidgety in the bed.  He rolls from his stomach to his side to his back.  He appears to move all extremities equally.    ED Results / Procedures / Treatments   Labs (all labs ordered are listed, but only abnormal results are displayed) Labs Reviewed  CBC - Abnormal; Notable for the following components:      Result Value   Hemoglobin 11.3 (*)    HCT 36.6 (*)    MCH 25.3 (*)    RDW 16.9 (*)    All other  components within normal limits  COMPREHENSIVE METABOLIC PANEL - Abnormal; Notable for the following components:   Glucose, Bld 160 (*)    All other components within normal limits  ETHANOL    EKG None  Radiology CT Head Wo Contrast  Result Date: 03/20/2021 CLINICAL DATA:  Head trauma, minor (Age >= 65y); Neck trauma (Age >= 65y); Facial trauma, blunt. EXAM: CT HEAD WITHOUT CONTRAST CT MAXILLOFACIAL WITHOUT CONTRAST CT CERVICAL SPINE WITHOUT CONTRAST TECHNIQUE: Multidetector CT imaging of the head, cervical spine, and maxillofacial structures were performed using the standard protocol without intravenous contrast. Multiplanar CT image reconstructions of the cervical spine and maxillofacial structures were also generated. COMPARISON:  CT head 06/03/2019 FINDINGS: CT HEAD FINDINGS BRAIN: BRAIN Patchy and confluent areas of decreased attenuation are noted throughout the deep and periventricular white matter of the cerebral hemispheres bilaterally, compatible with chronic microvascular ischemic disease. No evidence of large-territorial acute infarction. No parenchymal hemorrhage. No mass lesion. No extra-axial collection. No mass effect or midline shift. No hydrocephalus. Basilar cisterns are patent. Vascular: No hyperdense vessel. Atherosclerotic calcifications are present within the cavernous internal carotid arteries. Skull: No acute fracture or focal lesion. Other: None. CT MAXILLOFACIAL FINDINGS Osseous: Likely old healed bilateral nasal bone fractures. No definite acute displaced fracture or mandibular dislocation. No destructive process. Sinuses/Orbits: Almost complete opacification of the paranasal sinuses. Polypoid lesions within the nasal cavities (4:54). Paranasal sinuses and mastoid air cells are clear. The orbits are unremarkable. Soft tissues: No large hematoma formation. CT CERVICAL SPINE FINDINGS Alignment: Normal. Skull base and vertebrae: Multilevel severe degenerative changes spine with  fusion of the C4 through C6 levels. No acute fracture. No aggressive appearing focal osseous lesion or focal pathologic process. Soft tissues and spinal canal: No prevertebral fluid or swelling. No visible canal hematoma. Upper chest: Trace biapical paraseptal emphysematous changes. Other: None. IMPRESSION: 1. No acute intracranial abnormality. 2. No acute displaced facial fracture. 3. No acute displaced fracture or traumatic listhesis of the cervical spine. 4. Pansinusitis with polypoid lesions within the nasal cavities. Recommend direct visualization. Differential diagnosis includes nasal polyps, amyloidosis, neoplasm, inspissated secretions, fungal infection. Electronically Signed   By: Iven Finn M.D.   On: 03/20/2021 15:12   CT Cervical Spine Wo Contrast  Result Date: 03/20/2021 CLINICAL DATA:  Head trauma, minor (Age >= 65y); Neck trauma (Age >= 65y); Facial trauma, blunt. EXAM: CT HEAD WITHOUT CONTRAST CT MAXILLOFACIAL WITHOUT CONTRAST CT CERVICAL SPINE WITHOUT CONTRAST TECHNIQUE: Multidetector CT imaging of the head, cervical spine, and maxillofacial structures were performed using the standard protocol  without intravenous contrast. Multiplanar CT image reconstructions of the cervical spine and maxillofacial structures were also generated. COMPARISON:  CT head 06/03/2019 FINDINGS: CT HEAD FINDINGS BRAIN: BRAIN Patchy and confluent areas of decreased attenuation are noted throughout the deep and periventricular white matter of the cerebral hemispheres bilaterally, compatible with chronic microvascular ischemic disease. No evidence of large-territorial acute infarction. No parenchymal hemorrhage. No mass lesion. No extra-axial collection. No mass effect or midline shift. No hydrocephalus. Basilar cisterns are patent. Vascular: No hyperdense vessel. Atherosclerotic calcifications are present within the cavernous internal carotid arteries. Skull: No acute fracture or focal lesion. Other: None. CT  MAXILLOFACIAL FINDINGS Osseous: Likely old healed bilateral nasal bone fractures. No definite acute displaced fracture or mandibular dislocation. No destructive process. Sinuses/Orbits: Almost complete opacification of the paranasal sinuses. Polypoid lesions within the nasal cavities (4:54). Paranasal sinuses and mastoid air cells are clear. The orbits are unremarkable. Soft tissues: No large hematoma formation. CT CERVICAL SPINE FINDINGS Alignment: Normal. Skull base and vertebrae: Multilevel severe degenerative changes spine with fusion of the C4 through C6 levels. No acute fracture. No aggressive appearing focal osseous lesion or focal pathologic process. Soft tissues and spinal canal: No prevertebral fluid or swelling. No visible canal hematoma. Upper chest: Trace biapical paraseptal emphysematous changes. Other: None. IMPRESSION: 1. No acute intracranial abnormality. 2. No acute displaced facial fracture. 3. No acute displaced fracture or traumatic listhesis of the cervical spine. 4. Pansinusitis with polypoid lesions within the nasal cavities. Recommend direct visualization. Differential diagnosis includes nasal polyps, amyloidosis, neoplasm, inspissated secretions, fungal infection. Electronically Signed   By: Iven Finn M.D.   On: 03/20/2021 15:12   CT Maxillofacial Wo Contrast  Result Date: 03/20/2021 CLINICAL DATA:  Head trauma, minor (Age >= 65y); Neck trauma (Age >= 65y); Facial trauma, blunt. EXAM: CT HEAD WITHOUT CONTRAST CT MAXILLOFACIAL WITHOUT CONTRAST CT CERVICAL SPINE WITHOUT CONTRAST TECHNIQUE: Multidetector CT imaging of the head, cervical spine, and maxillofacial structures were performed using the standard protocol without intravenous contrast. Multiplanar CT image reconstructions of the cervical spine and maxillofacial structures were also generated. COMPARISON:  CT head 06/03/2019 FINDINGS: CT HEAD FINDINGS BRAIN: BRAIN Patchy and confluent areas of decreased attenuation are noted  throughout the deep and periventricular white matter of the cerebral hemispheres bilaterally, compatible with chronic microvascular ischemic disease. No evidence of large-territorial acute infarction. No parenchymal hemorrhage. No mass lesion. No extra-axial collection. No mass effect or midline shift. No hydrocephalus. Basilar cisterns are patent. Vascular: No hyperdense vessel. Atherosclerotic calcifications are present within the cavernous internal carotid arteries. Skull: No acute fracture or focal lesion. Other: None. CT MAXILLOFACIAL FINDINGS Osseous: Likely old healed bilateral nasal bone fractures. No definite acute displaced fracture or mandibular dislocation. No destructive process. Sinuses/Orbits: Almost complete opacification of the paranasal sinuses. Polypoid lesions within the nasal cavities (4:54). Paranasal sinuses and mastoid air cells are clear. The orbits are unremarkable. Soft tissues: No large hematoma formation. CT CERVICAL SPINE FINDINGS Alignment: Normal. Skull base and vertebrae: Multilevel severe degenerative changes spine with fusion of the C4 through C6 levels. No acute fracture. No aggressive appearing focal osseous lesion or focal pathologic process. Soft tissues and spinal canal: No prevertebral fluid or swelling. No visible canal hematoma. Upper chest: Trace biapical paraseptal emphysematous changes. Other: None. IMPRESSION: 1. No acute intracranial abnormality. 2. No acute displaced facial fracture. 3. No acute displaced fracture or traumatic listhesis of the cervical spine. 4. Pansinusitis with polypoid lesions within the nasal cavities. Recommend direct visualization. Differential diagnosis includes nasal  polyps, amyloidosis, neoplasm, inspissated secretions, fungal infection. Electronically Signed   By: Iven Finn M.D.   On: 03/20/2021 15:12    Procedures .Critical Care Performed by: Pattricia Boss, MD Authorized by: Pattricia Boss, MD   Critical care provider statement:     Critical care time (minutes):  45   Critical care was necessary to treat or prevent imminent or life-threatening deterioration of the following conditions:  CNS failure or compromise   Critical care was time spent personally by me on the following activities:  Development of treatment plan with patient or surrogate, discussions with consultants, evaluation of patient's response to treatment, examination of patient, ordering and review of laboratory studies, ordering and review of radiographic studies, ordering and performing treatments and interventions, pulse oximetry, re-evaluation of patient's condition and review of old charts Comments:     Signed out to Dr. Maryan Rued Advised that she will need to reevaluate for disposition    Medications Ordered in ED Medications  levETIRAcetam (KEPPRA) IVPB 1500 mg/ 100 mL premix (has no administration in time range)  LORazepam (ATIVAN) injection 1 mg (1 mg Intravenous Given 03/20/21 1417)    ED Course/ Medical Decision Making/ A&P Clinical Course as of 03/20/21 1537  Tue Mar 20, 2021  1522 CT head reviewed radiology interpretation reviewed with chronic pansinusitis noted [DR]  1533 Patient reexamined and able to talk with me but continues confused and appears postictal [DR]    Clinical Course User Index [DR] Pattricia Boss, MD                           Medical Decision Making 71 year old man known history disorder presents today with reported seizure.  On initial advanced exam he continued to be very postictal.  Patient with ongoing evaluation and no acute metabolic abnormality noted.  Electrolytes reviewed and appear to be normal unlikely contributing to worsening of seizures.  Blood sugar has been evaluated and within normal limits He will need to be reevaluated Review of records reveal that he has been on Keppra.  He was given Keppra IV here.  Amount and/or Complexity of Data Reviewed Independent Historian: EMS External Data Reviewed:  notes.    Details: Reviewed discharge summary from hospital in 2021 when he was admitted Labs: ordered. Radiology: ordered and independent interpretation performed. Decision-making details documented in ED Course. ECG/medicine tests: ordered. Decision-making details documented in ED Course.           Final Clinical Impression(s) / ED Diagnoses Final diagnoses:  Seizure ([place])  Seizure disorder ([place])  Confusion    Rx / DC Orders ED Discharge Orders     None         Pattricia Boss, MD 03/20/21 1537

## 2021-03-22 ENCOUNTER — Other Ambulatory Visit: Payer: Self-pay

## 2021-03-22 ENCOUNTER — Emergency Department (HOSPITAL_COMMUNITY): Payer: Medicare Other

## 2021-03-22 ENCOUNTER — Inpatient Hospital Stay (HOSPITAL_COMMUNITY)
Admission: EM | Admit: 2021-03-22 | Discharge: 2021-03-27 | DRG: 100 | Disposition: A | Discharge status: Skilled Nursing Facility | Payer: Medicare Other | Attending: Internal Medicine | Admitting: Internal Medicine

## 2021-03-22 ENCOUNTER — Encounter (HOSPITAL_COMMUNITY): Payer: Self-pay

## 2021-03-22 DIAGNOSIS — Z79899 Other long term (current) drug therapy: Secondary | ICD-10-CM

## 2021-03-22 DIAGNOSIS — Z91199 Patient's noncompliance with other medical treatment and regimen due to unspecified reason: Secondary | ICD-10-CM

## 2021-03-22 DIAGNOSIS — R54 Age-related physical debility: Secondary | ICD-10-CM | POA: Diagnosis present

## 2021-03-22 DIAGNOSIS — E872 Acidosis, unspecified: Secondary | ICD-10-CM | POA: Diagnosis present

## 2021-03-22 DIAGNOSIS — I10 Essential (primary) hypertension: Secondary | ICD-10-CM | POA: Diagnosis present

## 2021-03-22 DIAGNOSIS — E860 Dehydration: Secondary | ICD-10-CM | POA: Diagnosis present

## 2021-03-22 DIAGNOSIS — G40909 Epilepsy, unspecified, not intractable, without status epilepticus: Secondary | ICD-10-CM | POA: Diagnosis not present

## 2021-03-22 DIAGNOSIS — K5730 Diverticulosis of large intestine without perforation or abscess without bleeding: Secondary | ICD-10-CM | POA: Diagnosis present

## 2021-03-22 DIAGNOSIS — R4182 Altered mental status, unspecified: Secondary | ICD-10-CM

## 2021-03-22 DIAGNOSIS — E119 Type 2 diabetes mellitus without complications: Secondary | ICD-10-CM | POA: Diagnosis present

## 2021-03-22 DIAGNOSIS — S0081XA Abrasion of other part of head, initial encounter: Secondary | ICD-10-CM | POA: Diagnosis present

## 2021-03-22 DIAGNOSIS — Z9114 Patient's other noncompliance with medication regimen: Secondary | ICD-10-CM

## 2021-03-22 DIAGNOSIS — R569 Unspecified convulsions: Secondary | ICD-10-CM | POA: Diagnosis not present

## 2021-03-22 DIAGNOSIS — Z5902 Unsheltered homelessness: Secondary | ICD-10-CM

## 2021-03-22 DIAGNOSIS — N179 Acute kidney failure, unspecified: Secondary | ICD-10-CM | POA: Diagnosis present

## 2021-03-22 DIAGNOSIS — G9341 Metabolic encephalopathy: Secondary | ICD-10-CM | POA: Diagnosis present

## 2021-03-22 DIAGNOSIS — Z596 Low income: Secondary | ICD-10-CM

## 2021-03-22 DIAGNOSIS — R32 Unspecified urinary incontinence: Secondary | ICD-10-CM | POA: Diagnosis present

## 2021-03-22 DIAGNOSIS — F05 Delirium due to known physiological condition: Secondary | ICD-10-CM | POA: Diagnosis present

## 2021-03-22 DIAGNOSIS — Z20822 Contact with and (suspected) exposure to covid-19: Secondary | ICD-10-CM | POA: Diagnosis present

## 2021-03-22 DIAGNOSIS — X58XXXA Exposure to other specified factors, initial encounter: Secondary | ICD-10-CM | POA: Diagnosis present

## 2021-03-22 LAB — RESP PANEL BY RT-PCR (FLU A&B, COVID) ARPGX2
Influenza A by PCR: NEGATIVE
Influenza B by PCR: NEGATIVE
SARS Coronavirus 2 by RT PCR: NEGATIVE

## 2021-03-22 LAB — COMPREHENSIVE METABOLIC PANEL
ALT: 19 U/L (ref 0–44)
AST: 39 U/L (ref 15–41)
Albumin: 4.1 g/dL (ref 3.5–5.0)
Alkaline Phosphatase: 89 U/L (ref 38–126)
Anion gap: 20 — ABNORMAL HIGH (ref 5–15)
BUN: 27 mg/dL — ABNORMAL HIGH (ref 8–23)
CO2: 16 mmol/L — ABNORMAL LOW (ref 22–32)
Calcium: 9.1 mg/dL (ref 8.9–10.3)
Chloride: 104 mmol/L (ref 98–111)
Creatinine, Ser: 1.54 mg/dL — ABNORMAL HIGH (ref 0.61–1.24)
GFR, Estimated: 48 mL/min — ABNORMAL LOW (ref >60)
Glucose, Bld: 70 mg/dL (ref 70–99)
Potassium: 4.1 mmol/L (ref 3.5–5.1)
Sodium: 140 mmol/L (ref 135–145)
Total Bilirubin: 1.2 mg/dL (ref 0.3–1.2)
Total Protein: 7.9 g/dL (ref 6.5–8.1)

## 2021-03-22 LAB — CBC
HCT: 41.8 % (ref 39.0–52.0)
Hemoglobin: 12.3 g/dL — ABNORMAL LOW (ref 13.0–17.0)
MCH: 25.1 pg — ABNORMAL LOW (ref 26.0–34.0)
MCHC: 29.4 g/dL — ABNORMAL LOW (ref 30.0–36.0)
MCV: 85.3 fL (ref 80.0–100.0)
Platelets: 418 10*3/uL — ABNORMAL HIGH (ref 150–400)
RBC: 4.9 MIL/uL (ref 4.22–5.81)
RDW: 17.3 % — ABNORMAL HIGH (ref 11.5–15.5)
WBC: 10.5 10*3/uL (ref 4.0–10.5)
nRBC: 0 % (ref 0.0–0.2)

## 2021-03-22 LAB — ETHANOL: Alcohol, Ethyl (B): <10 mg/dL (ref <10)

## 2021-03-22 LAB — CBG MONITORING, ED: Glucose-Capillary: 71 mg/dL (ref 70–99)

## 2021-03-22 MED ORDER — SODIUM CHLORIDE 0.9 % IV BOLUS
1000.0000 mL | Freq: Once | INTRAVENOUS | Status: AC
  Administered 2021-03-22: 1000 mL via INTRAVENOUS

## 2021-03-22 MED ORDER — LEVETIRACETAM IN NACL 1000 MG/100ML IV SOLN
1000.0000 mg | Freq: Once | INTRAVENOUS | Status: AC
Start: 2021-03-22 — End: 2021-03-22
  Administered 2021-03-22: 1000 mg via INTRAVENOUS
  Filled 2021-03-22: qty 100

## 2021-03-22 NOTE — ED Provider Notes (Signed)
[facility] DEPT Provider Note   CSN: [id number] Arrival date & time: 03/22/21  1350     History  Chief Complaint  Patient presents with   Altered Mental Status    Thomas Kline is a 71 y.o. male.  71 year old male with prior medical history as detailed below presents for evaluation.  Patient apparently was found lying on the ground near a truck stop.  Patient still had an IV and [facility] bracelet on.  Patient appears to have been evaluated 2 days prior for possible seizure-like activity.  Patient is a poor historian.  Patient has multiple abrasions to the face and scalp.  Patient apparently was covered with stool and urine upon his initial evaluation with EMS.  It is unclear whether the patient had a seizure today.   No bystander information is available.  Patient with prior history of seizure and prolonged postictal phase.  The history is provided by the patient and the EMS personnel.  Seizures Seizure activity on arrival: no   Seizure type:  Unable to specify Initial focality:  Unable to specify Postictal symptoms: confusion   Return to baseline: no       Home Medications Prior to Admission medications   Medication Sig Start Date End Date Taking? Authorizing Provider  amLODipine (NORVASC) 5 MG tablet Take 1 tablet (5 mg total) by mouth daily. 03/20/21 06/18/21  Blanchie Dessert, MD  ferrous sulfate 325 (65 FE) MG tablet Take 1 tablet (325 mg total) by mouth daily with breakfast. 08/06/19   Lattie Haw, MD  fluticasone (FLONASE) 50 MCG/ACT nasal spray Place 2 sprays into both nostrils daily. Patient not taking: Reported on 06/04/2019 10/06/17   Nuala Alpha, MD  fluticasone [facility]) 50 MCG/ACT nasal spray Place 2 sprays into both nostrils daily as needed for allergies or rhinitis.    [provider]  hydrochlorothiazide (HYDRODIURIL) 25 MG tablet Take 1 tablet (25 mg total) by mouth daily. 03/20/21 06/18/21  Blanchie Dessert, MD   levETIRAcetam (KEPPRA) 500 MG tablet Take 1 tablet (500 mg total) by mouth 2 (two) times daily. 03/20/21   Blanchie Dessert, MD  loratadine (CLARITIN) 10 MG tablet Take 1 tablet (10 mg total) by mouth daily. Patient not taking: Reported on 06/04/2019 10/23/17   Nuala Alpha, MD  montelukast (SINGULAIR) 10 MG tablet Take 1 tablet (10 mg total) by mouth at bedtime. Patient not taking: Reported on 06/04/2019 10/23/17   Nuala Alpha, MD  omeprazole (PRILOSEC) 20 MG capsule Take 20 mg by mouth 2 (two) times daily. 07/07/19   [provider]  pantoprazole (PROTONIX) 20 MG tablet Take 1 tablet (20 mg total) by mouth 2 (two) times daily. Patient not taking: Reported on 06/04/2019 10/06/17   Nuala Alpha, MD  sodium chloride (OCEAN) 0.65 % SOLN nasal spray Place 1 spray into both nostrils as needed for congestion. 08/06/19   Wilber Oliphant, MD      Allergies    Patient has no known allergies.    Review of Systems   Review of Systems  Neurological:  Positive for seizures.  All other systems reviewed and are negative.  Physical Exam Updated Vital Signs BP 138/81 (BP Location: Left Arm)    Pulse 92    Temp 98 F (36.7 C) (Oral)    Resp 18    Ht 6' (1.829 m)    Wt 101 kg    SpO2 96%    BMI 30.20 kg/m  Physical Exam Vitals and nursing note reviewed.  Constitutional:      General: He is not in acute distress.    Appearance: Normal appearance. He is well-developed.  HENT:     Head: Normocephalic.     Comments: Multiple abrasions to scalp and nasal bridge of various ages. Eyes:     Conjunctiva/sclera: Conjunctivae normal.     Pupils: Pupils are equal, round, and reactive to light.  Cardiovascular:     Rate and Rhythm: Normal rate and regular rhythm.     Heart sounds: Normal heart sounds.  Pulmonary:     Effort: Pulmonary effort is normal. No respiratory distress.     Breath sounds: Normal breath sounds.  Abdominal:     General: There is no distension.     Palpations: Abdomen is  soft.     Tenderness: There is no abdominal tenderness.  Musculoskeletal:        General: No deformity. Normal range of motion.     Cervical back: Normal range of motion and neck supple.  Skin:    General: Skin is warm and dry.  Neurological:     General: No focal deficit present.     Mental Status: He is alert.     Comments: Alert  Oriented to person, place  5/5 strength to BUE/BLE  VAN negative    ED Results / Procedures / Treatments   Labs (all labs ordered are listed, but only abnormal results are displayed) Labs Reviewed  COMPREHENSIVE METABOLIC PANEL - Abnormal; Notable for the following components:      Result Value   CO2 16 (*)    BUN 27 (*)    Creatinine, Ser 1.54 (*)    GFR, Estimated 48 (*)    Anion gap 20 (*)    All other components within normal limits  CBC - Abnormal; Notable for the following components:   Hemoglobin 12.3 (*)    MCH 25.1 (*)    MCHC 29.4 (*)    RDW 17.3 (*)    Platelets 418 (*)    All other components within normal limits  RESP PANEL BY RT-PCR (FLU A&B, COVID) ARPGX2  ETHANOL  RAPID URINE DRUG SCREEN, HOSP PERFORMED  URINALYSIS, ROUTINE W REFLEX MICROSCOPIC  CBG MONITORING, ED    EKG None  Radiology No results found.  Procedures Procedures    Medications Ordered in ED Medications  levETIRAcetam (KEPPRA) IVPB 1000 mg/100 mL premix (has no administration in time range)    ED Course/ Medical Decision Making/ A&P                           Medical Decision Making Patient presented with alteration in mental status.  Patient with prior history of seizure.  Patient's presentation is consistent with likely postictal confusion.  Patient observed in the ED for several hours without significant improvement in his level of confusion.  Prior admission in May 2021 demonstrated similar presentation with improved sensorium and mental status after observation.  CT imaging of the patient's brain does not show acute pathology.  Baseline  labs obtained otherwise demonstrate mild dehydration.  Patient was given 1 g of Keppra on arrival.  Patient is supposed to be taking 500 mg of Keppra twice daily.  Given that patient was evaluated for possible seizure-like activity 2 days prior it seems unlikely that he is compliant.  Patient without evidence of significant traumatic injury.  Patient's case discussed with Dr. Lorrin Goodell of neurology.  He agrees with assessment - we will plan  to admit for observation.  Neurology request official consultation if patient fails return to baseline in a.m.  Amount and/or Complexity of Data Reviewed External Data Reviewed: notes.    Details: Prior ED evaluation. Prior Inpatient evaluation. Labs: ordered. Radiology: ordered and independent interpretation performed.    Details: CT Head - NAD  Agree with ED evaluation.    CRITICAL CARE Performed by: Valarie Merino   Total critical care time: 45 minutes  Critical care time was exclusive of separately billable procedures and treating other patients.  Critical care was necessary to treat or prevent imminent or life-threatening deterioration.  Critical care was time spent personally by me on the following activities: development of treatment plan with patient and/or surrogate as well as nursing, discussions with consultants, evaluation of patient's response to treatment, examination of patient, obtaining history from patient or surrogate, ordering and performing treatments and interventions, ordering and review of laboratory studies, ordering and review of radiographic studies, pulse oximetry and re-evaluation of patient's condition.        Final Clinical Impression(s) / ED Diagnoses Final diagnoses:  None    Rx / DC Orders ED Discharge Orders     None         Valarie Merino, MD 03/22/21 2231

## 2021-03-22 NOTE — ED Triage Notes (Addendum)
Per EMS- patient was found lying in a field by a truck stop. Patient still had an IV and [facility] bracelet on where he had been seen 2 days prior for seizure evaluation. Patient is only oriented to self. Patient has a laceration to his nose and was incontinent of stool and urine.  Patient added in triage tht he did not feel very well.
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
## 2021-03-22 NOTE — ED Notes (Signed)
I attempted to collect labs and was unsuccessful. 

## 2021-03-23 ENCOUNTER — Observation Stay (HOSPITAL_COMMUNITY)
Admit: 2021-03-23 | Discharge: 2021-03-23 | Disposition: A | Discharge status: Home / Self Care | Payer: Medicare Other | Attending: Internal Medicine | Admitting: Internal Medicine

## 2021-03-23 ENCOUNTER — Encounter (HOSPITAL_COMMUNITY): Payer: Self-pay | Admitting: Internal Medicine

## 2021-03-23 DIAGNOSIS — E119 Type 2 diabetes mellitus without complications: Secondary | ICD-10-CM

## 2021-03-23 DIAGNOSIS — R569 Unspecified convulsions: Secondary | ICD-10-CM

## 2021-03-23 DIAGNOSIS — F05 Delirium due to known physiological condition: Secondary | ICD-10-CM | POA: Diagnosis not present

## 2021-03-23 DIAGNOSIS — E872 Acidosis, unspecified: Secondary | ICD-10-CM | POA: Diagnosis present

## 2021-03-23 DIAGNOSIS — N179 Acute kidney failure, unspecified: Secondary | ICD-10-CM | POA: Diagnosis present

## 2021-03-23 LAB — CBC
HCT: 28.7 % — ABNORMAL LOW (ref 39.0–52.0)
Hemoglobin: 8.6 g/dL — ABNORMAL LOW (ref 13.0–17.0)
MCH: 25.1 pg — ABNORMAL LOW (ref 26.0–34.0)
MCHC: 30 g/dL (ref 30.0–36.0)
MCV: 83.9 fL (ref 80.0–100.0)
Platelets: 289 10*3/uL (ref 150–400)
RBC: 3.42 MIL/uL — ABNORMAL LOW (ref 4.22–5.81)
RDW: 17.1 % — ABNORMAL HIGH (ref 11.5–15.5)
WBC: 6.8 10*3/uL (ref 4.0–10.5)
nRBC: 0 % (ref 0.0–0.2)

## 2021-03-23 LAB — RAPID URINE DRUG SCREEN, HOSP PERFORMED
Amphetamines: NOT DETECTED
Barbiturates: NOT DETECTED
Benzodiazepines: NOT DETECTED
Cocaine: NOT DETECTED
Opiates: NOT DETECTED
Tetrahydrocannabinol: NOT DETECTED

## 2021-03-23 LAB — URINALYSIS, ROUTINE W REFLEX MICROSCOPIC
Bilirubin Urine: NEGATIVE
Glucose, UA: NEGATIVE mg/dL
Hgb urine dipstick: NEGATIVE
Ketones, ur: 80 mg/dL — AB
Leukocytes,Ua: NEGATIVE
Nitrite: NEGATIVE
Protein, ur: NEGATIVE mg/dL
Specific Gravity, Urine: 1.023 (ref 1.005–1.030)
pH: 5 (ref 5.0–8.0)

## 2021-03-23 LAB — LACTIC ACID, PLASMA: Lactic Acid, Venous: 1.1 mmol/L (ref 0.5–1.9)

## 2021-03-23 LAB — BASIC METABOLIC PANEL
Anion gap: 13 (ref 5–15)
BUN: 23 mg/dL (ref 8–23)
CO2: 19 mmol/L — ABNORMAL LOW (ref 22–32)
Calcium: 8.5 mg/dL — ABNORMAL LOW (ref 8.9–10.3)
Chloride: 108 mmol/L (ref 98–111)
Creatinine, Ser: 1.08 mg/dL (ref 0.61–1.24)
GFR, Estimated: >60 mL/min (ref >60)
Glucose, Bld: 76 mg/dL (ref 70–99)
Potassium: 3.8 mmol/L (ref 3.5–5.1)
Sodium: 140 mmol/L (ref 135–145)

## 2021-03-23 LAB — HEMOGLOBIN A1C
Hgb A1c MFr Bld: 6 % — ABNORMAL HIGH (ref 4.8–5.6)
Mean Plasma Glucose: 125.5 mg/dL

## 2021-03-23 LAB — CBG MONITORING, ED: Glucose-Capillary: 96 mg/dL (ref 70–99)

## 2021-03-23 MED ORDER — AMLODIPINE BESYLATE 5 MG PO TABS
5.0000 mg | ORAL_TABLET | Freq: Every day | ORAL | Status: DC
  Administered 2021-03-23: 5 mg via ORAL
  Filled 2021-03-23: qty 1

## 2021-03-23 MED ORDER — ACETAMINOPHEN 325 MG PO TABS: 650.0000 mg | ORAL_TABLET | Freq: Four times a day (QID) | ORAL | Status: DC | PRN

## 2021-03-23 MED ORDER — HYDRALAZINE HCL 25 MG PO TABS: 25.0000 mg | ORAL_TABLET | Freq: Three times a day (TID) | ORAL | Status: DC | PRN

## 2021-03-23 MED ORDER — ENOXAPARIN SODIUM 40 MG/0.4ML IJ SOSY
40.0000 mg | PREFILLED_SYRINGE | INTRAMUSCULAR | Status: DC
  Administered 2021-03-23 – 2021-03-27 (×5): 40 mg via SUBCUTANEOUS
  Filled 2021-03-23 (×5): qty 0.4

## 2021-03-23 MED ORDER — ACETAMINOPHEN 650 MG RE SUPP: 650.0000 mg | Freq: Four times a day (QID) | RECTAL | Status: DC | PRN

## 2021-03-23 MED ORDER — SODIUM CHLORIDE 0.9 % IV SOLN: INTRAVENOUS | Status: DC

## 2021-03-23 MED ORDER — LEVETIRACETAM 500 MG PO TABS
500.0000 mg | ORAL_TABLET | Freq: Two times a day (BID) | ORAL | Status: DC
  Administered 2021-03-23 – 2021-03-27 (×9): 500 mg via ORAL
  Filled 2021-03-23 (×9): qty 1

## 2021-03-23 NOTE — Progress Notes (Signed)
EEG completed, results pending. 

## 2021-03-23 NOTE — ED Notes (Signed)
ED TO INPATIENT HANDOFF REPORT  ED Nurse Name and Phone #: Erick Colace, RN [phone]  S Name/Age/Gender Thomas Kline 71 y.o. male Room/Bed: WA24/WA24  Code Status   Code Status: Full Code  Home/SNF/Other Home Patient oriented to: self, place, and situation Is this baseline? Yes   Triage Complete: Triage complete  Chief Complaint Seizure [facility]) [R56.9]  Triage Note Per EMS- patient was found lying in a field by a truck stop. Patient still had an IV and [facility] bracelet on where he had been seen 2 days prior for seizure evaluation. Patient is only oriented to self. Patient has a laceration to his nose and was incontinent of stool and urine.  Patient added in triage tht he did not feel very well.   Allergies No Known Allergies  Level of Care/Admitting Diagnosis ED Disposition     ED Disposition  Admit   Condition  --   Comment  Hospital Area: [place] [[id number]]  Level of Care: Telemetry [5]  Admit to tele based on following criteria: Complex arrhythmia (Bradycardia/Tachycardia)  May place patient in observation at [facility] or [place] if equivalent level of care is available:: Yes  Covid Evaluation: Asymptomatic Screening Protocol (No Symptoms)  Diagnosis: Seizure [facility]) [[id number]]  Admitting Physician: Shela Leff [[id number]]  Attending Physician: Shela Leff [[id number]]          B Medical/Surgery History Past Medical History:  Diagnosis Date   GERD (gastroesophageal reflux disease)    Hypertension    Intestinal polyps    colonoscopy from 05/06/11: multiple polyps and internal and external hemorrhoids. along with pan diverticulosis   Pancolonic diverticulosis 05/06/11   diagnosed on colonoscopy from 04/2011 along with multiple polyps and internal and external hemorrhoids.    Seizures [facility])    Past Surgical History:  Procedure Laterality Date   COLONOSCOPY  05/06/2011   Procedure: COLONOSCOPY;  Surgeon: Beryle Beams, MD;   Location: [place];  Service: Endoscopy;  Laterality: N/A;   COMPUTER ASSISTED IMAGE GUIDE NAVIGATION Bilateral 08/03/2019   Procedure: Bilateral Computer Assisted Image Guide Navigation;  Surgeon: Melida Quitter, MD;  Location: [place];  Service: ENT;  Laterality: Bilateral;   ETHMOIDECTOMY Bilateral 08/03/2019   Procedure: Bilateral total Ethmoidectomy;  Surgeon: Melida Quitter, MD;  Location: [place];  Service: ENT;  Laterality: Bilateral;   FRONTAL SINUS EXPLORATION Bilateral 08/03/2019   Procedure: Bilateral Frontal Sinus Exploration;  Surgeon: Melida Quitter, MD;  Location: [place];  Service: ENT;  Laterality: Bilateral;   MAXILLARY ANTROSTOMY Bilateral 08/03/2019   Procedure: Bilateral Maxillary Antrostomy with tissue removal;  Surgeon: Melida Quitter, MD;  Location: [place];  Service: ENT;  Laterality: Bilateral;   SPHENOIDECTOMY Bilateral 08/03/2019   Procedure: Bilateral Sphenoidectomy with tissue removal;  Surgeon: Melida Quitter, MD;  Location: [place];  Service: ENT;  Laterality: Bilateral;     A IV Location/Drains/Wounds Patient Lines/Drains/Airways Status     Active Line/Drains/Airways     Name Placement date Placement time Site Days   Peripheral IV 03/22/21 22 G Posterior;Right Hand 03/22/21  1908  Hand  1   Peripheral IV 03/22/21 20 G 2.5" Left;Upper Arm 03/22/21  2015  Arm  1   External Urinary Catheter 03/23/21  0114  --  less than 1   Incision (Closed) 08/03/19 Nose 08/03/19  1726  -- 598            Intake/Output Last 24 hours  Intake/Output Summary (Last 24 hours) at 03/23/2021 1956 Last data filed at  03/23/2021 0805 Gross per 24 hour  Intake 1518 ml  Output --  Net 1518 ml    Labs/Imaging Results for orders placed or performed during the hospital encounter of 03/22/21 (from the past 48 hour(s))  Comprehensive metabolic panel     Status: Abnormal   Collection Time: 03/22/21  2:40 PM  Result Value Ref Range   Sodium 140 135 - 145 mmol/L   Potassium 4.1 3.5 - 5.1 mmol/L    Chloride 104 98 - 111 mmol/L   CO2 16 (L) 22 - 32 mmol/L   Glucose, Bld 70 70 - 99 mg/dL    Comment: Glucose reference range applies only to samples taken after fasting for at least 8 hours.   BUN 27 (H) 8 - 23 mg/dL   Creatinine, Ser 1.54 (H) 0.61 - 1.24 mg/dL   Calcium 9.1 8.9 - 10.3 mg/dL   Total Protein 7.9 6.5 - 8.1 g/dL   Albumin 4.1 3.5 - 5.0 g/dL   AST 39 15 - 41 U/L   ALT 19 0 - 44 U/L   Alkaline Phosphatase 89 38 - 126 U/L   Total Bilirubin 1.2 0.3 - 1.2 mg/dL   GFR, Estimated 48 (L) >60 mL/min    Comment: (NOTE) Calculated using the CKD-EPI Creatinine Equation (2021)    Anion gap 20 (H) 5 - 15    Comment: Performed at [facility], [place] [address], [place], [place] [address]  CBC     Status: Abnormal   Collection Time: 03/22/21  2:40 PM  Result Value Ref Range   WBC 10.5 4.0 - 10.5 K/uL   RBC 4.90 4.22 - 5.81 MIL/uL   Hemoglobin 12.3 (L) 13.0 - 17.0 g/dL   HCT 41.8 39.0 - 52.0 %   MCV 85.3 80.0 - 100.0 fL   MCH 25.1 (L) 26.0 - 34.0 pg   MCHC 29.4 (L) 30.0 - 36.0 g/dL   RDW 17.3 (H) 11.5 - 15.5 %   Platelets 418 (H) 150 - 400 K/uL   nRBC 0.0 0.0 - 0.2 %    Comment: Performed at [facility], [place] [address] [place], [place] [address]  CBG monitoring, ED     Status: None   Collection Time: 03/22/21  4:41 PM  Result Value Ref Range   Glucose-Capillary 71 70 - 99 mg/dL    Comment: Glucose reference range applies only to samples taken after fasting for at least 8 hours.  Ethanol     Status: None   Collection Time: 03/22/21  7:28 PM  Result Value Ref Range   Alcohol, Ethyl (B) <10 <10 mg/dL    Comment: (NOTE) Lowest detectable limit for serum alcohol is 10 mg/dL.  For medical purposes only. Performed at [place], [place] [address], [place], [place] [address]   Resp Panel by RT-PCR (Flu A&B, Covid) Nasopharyngeal Swab     Status: None   Collection Time: 03/22/21  7:28 PM   Specimen: Nasopharyngeal Swab;  Nasopharyngeal(NP) swabs in vial transport medium  Result Value Ref Range   SARS Coronavirus 2 by RT PCR NEGATIVE NEGATIVE    Comment: (NOTE) SARS-CoV-2 target nucleic acids are NOT DETECTED.  The SARS-CoV-2 RNA is generally detectable in upper respiratory specimens during the acute phase of infection. The lowest concentration of SARS-CoV-2 viral copies this assay can detect is 138 copies/mL. A negative result does not preclude SARS-Cov-2 infection and should not be used as the sole basis for treatment or other patient management  decisions. A negative result may occur with  improper specimen collection/handling, submission of specimen other than nasopharyngeal swab, presence of viral mutation(s) within the areas targeted by this assay, and inadequate number of viral copies(<138 copies/mL). A negative result must be combined with clinical observations, patient history, and epidemiological information. The expected result is Negative.  Fact Sheet for Patients:  EntrepreneurPulse.com.au  Fact Sheet for Healthcare Providers:  IncredibleEmployment.be  This test is no t yet approved or cleared by the [place] FDA and  has been authorized for detection and/or diagnosis of SARS-CoV-2 by FDA under an Emergency Use Authorization (EUA). This EUA will remain  in effect (meaning this test can be used) for the duration of the COVID-19 declaration under Section 564(b)(1) of the Act, 21 U.S.C.section 360bbb-3(b)(1), unless the authorization is terminated  or revoked sooner.       Influenza A by PCR NEGATIVE NEGATIVE   Influenza B by PCR NEGATIVE NEGATIVE    Comment: (NOTE) The Xpert Xpress SARS-CoV-2/FLU/RSV plus assay is intended as an aid in the diagnosis of influenza from Nasopharyngeal swab specimens and should not be used as a sole basis for treatment. Nasal washings and aspirates are unacceptable for Xpert Xpress  SARS-CoV-2/FLU/RSV testing.  Fact Sheet for Patients: EntrepreneurPulse.com.au  Fact Sheet for Healthcare Providers: IncredibleEmployment.be  This test is not yet approved or cleared by the [place] FDA and has been authorized for detection and/or diagnosis of SARS-CoV-2 by FDA under an Emergency Use Authorization (EUA). This EUA will remain in effect (meaning this test can be used) for the duration of the COVID-19 declaration under Section 564(b)(1) of the Act, 21 U.S.C. section 360bbb-3(b)(1), unless the authorization is terminated or revoked.  Performed at [facility], [place] [address] [place], [place] [address]   CBC     Status: Abnormal   Collection Time: 03/23/21  3:51 AM  Result Value Ref Range   WBC 6.8 4.0 - 10.5 K/uL   RBC 3.42 (L) 4.22 - 5.81 MIL/uL   Hemoglobin 8.6 (L) 13.0 - 17.0 g/dL    Comment: REPEATED TO VERIFY   HCT 28.7 (L) 39.0 - 52.0 %   MCV 83.9 80.0 - 100.0 fL   MCH 25.1 (L) 26.0 - 34.0 pg   MCHC 30.0 30.0 - 36.0 g/dL   RDW 17.1 (H) 11.5 - 15.5 %   Platelets 289 150 - 400 K/uL   nRBC 0.0 0.0 - 0.2 %    Comment: Performed at [facility], [place] [address] [place],  [address]  Basic metabolic panel     Status: Abnormal   Collection Time: 03/23/21  3:51 AM  Result Value Ref Range   Sodium 140 135 - 145 mmol/L   Potassium 3.8 3.5 - 5.1 mmol/L   Chloride 108 98 - 111 mmol/L   CO2 19 (L) 22 - 32 mmol/L   Glucose, Bld 76 70 - 99 mg/dL    Comment: Glucose reference range applies only to samples taken after fasting for at least 8 hours.   BUN 23 8 - 23 mg/dL   Creatinine, Ser 1.08 0.61 - 1.24 mg/dL   Calcium 8.5 (L) 8.9 - 10.3 mg/dL   GFR, Estimated >60 >60 mL/min    Comment: (NOTE) Calculated using the CKD-EPI Creatinine Equation (2021)    Anion gap 13 5 - 15    Comment: Performed at [facility], [place] [address] [place], [place] [address]  Lactic  acid, plasma  Status: None   Collection Time: 03/23/21  3:51 AM  Result Value Ref Range   Lactic Acid, Venous 1.1 0.5 - 1.9 mmol/L    Comment: Performed at [facility], [place] [address] [place], [place] [address]  Hemoglobin A1c     Status: Abnormal   Collection Time: 03/23/21  3:51 AM  Result Value Ref Range   Hgb A1c MFr Bld 6.0 (H) 4.8 - 5.6 %    Comment: (NOTE) Pre diabetes:          5.7%-6.4%  Diabetes:              >6.4%  Glycemic control for   <7.0% adults with diabetes    Mean Plasma Glucose 125.5 mg/dL    Comment: Performed at [place] [address], [place], [place] [address]  Urine rapid drug screen (hosp performed)     Status: None   Collection Time: 03/23/21  3:55 AM  Result Value Ref Range   Opiates NONE DETECTED NONE DETECTED   Cocaine NONE DETECTED NONE DETECTED   Benzodiazepines NONE DETECTED NONE DETECTED   Amphetamines NONE DETECTED NONE DETECTED   Tetrahydrocannabinol NONE DETECTED NONE DETECTED   Barbiturates NONE DETECTED NONE DETECTED    Comment: (NOTE) DRUG SCREEN FOR MEDICAL PURPOSES ONLY.  IF CONFIRMATION IS NEEDED FOR ANY PURPOSE, NOTIFY LAB WITHIN 5 DAYS.  LOWEST DETECTABLE LIMITS FOR URINE DRUG SCREEN Drug Class                     Cutoff (ng/mL) Amphetamine and metabolites    1000 Barbiturate and metabolites    200 Benzodiazepine                 130 Tricyclics and metabolites     300 Opiates and metabolites        300 Cocaine and metabolites        300 THC                            50 Performed at [facility], [place] [address], [place], [place] [address]   Urinalysis, Routine w reflex microscopic     Status: Abnormal   Collection Time: 03/23/21  3:55 AM  Result Value Ref Range   Color, Urine YELLOW YELLOW   APPearance CLEAR CLEAR   Specific Gravity, Urine 1.023 1.005 - 1.030   pH 5.0 5.0 - 8.0   Glucose, UA NEGATIVE NEGATIVE mg/dL   Hgb urine dipstick NEGATIVE NEGATIVE    Bilirubin Urine NEGATIVE NEGATIVE   Ketones, ur 80 (A) NEGATIVE mg/dL   Protein, ur NEGATIVE NEGATIVE mg/dL   Nitrite NEGATIVE NEGATIVE   Leukocytes,Ua NEGATIVE NEGATIVE    Comment: Performed at [facility] [address], [place], [place] [address]  CBG monitoring, ED     Status: None   Collection Time: 03/23/21  5:19 PM  Result Value Ref Range   Glucose-Capillary 96 70 - 99 mg/dL    Comment: Glucose reference range applies only to samples taken after fasting for at least 8 hours.   CT Head Wo Contrast  Result Date: 03/22/2021 CLINICAL DATA:  Head trauma in a 71 year old male. EXAM: CT HEAD WITHOUT CONTRAST TECHNIQUE: Contiguous axial images were obtained from the base of the skull through the vertex without intravenous contrast. Radiation dose reduction: This exam was performed according to the departmental dose-optimization program which includes automated exposure control, adjustment of  the mA and/or kV according to patient size and/or use of iterative reconstruction technique. COMPARISON:  Comparison is made with MRI of the brain of June 04, 2019. Also with head CT of March 20, 2021. FINDINGS: Brain: No evidence of acute infarction, hemorrhage, hydrocephalus, extra-axial collection or mass lesion/mass effect. Signs of atrophy and chronic microvascular ischemic change are similar to prior imaging. Vascular: No hyperdense vessel or unexpected calcification. Skull: Normal. Negative for fracture or focal lesion. Sinuses/Orbits: Diffuse paranasal sinus disease improved compared to more remote imaging involving all visualized paranasal sinuses to some extent, as far back as 2012 there was complete opacification of the paranasal sinuses. Some improvement perhaps since recent comparison imaging from March 20, 2021 with continued polypoid appearance of many of these areas. Other: None IMPRESSION: No acute intracranial abnormality with signs of chronic microvascular ischemic change  and atrophy as before. Paranasal sinus disease perhaps slightly improved compared to recent imaging and markedly improved compared to prior studies. Nodular appearance particularly in the sphenoid sinuses may reflect inspissated secretions, sequela of fungal infection or even neoplasm. Direct visualization if not yet performed on follow-up may be helpful as stated previously. Electronically Signed   By: Zetta Bills M.D.   On: 03/22/2021 18:09   DG Chest Port 1 View  Result Date: 03/22/2021 CLINICAL DATA:  weakness EXAM: PORTABLE CHEST 1 VIEW COMPARISON:  Chest x-ray 05/03/2011 FINDINGS: Slightly prominent cardiomediastinal silhouette likely due to AP portable technique. Otherwise the heart and mediastinal contours are within normal limits. No focal consolidation. No pulmonary edema. No pleural effusion. No pneumothorax. No acute osseous abnormality. IMPRESSION: Slightly prominent cardiomediastinal silhouette likely due to AP portable technique. No active disease. Electronically Signed   By: Iven Finn M.D.   On: 03/22/2021 17:15   EEG adult  Result Date: 03/23/2021 Lora Havens, MD     03/23/2021  3:47 PM Patient Name: Thorn Demas MRN: [id number] Epilepsy Attending: Lora Havens Referring Physician/Provider: Dr Derrick Ravel Date: 03/23/2021 Duration: 23.30 mins Patient history: 71yo m with breakthrough seizure. EEG to evaluate for seizure Level of alertness: Awake, asleep AEDs during EEG study: LEV Technical aspects: This EEG study was done with scalp electrodes positioned according to the 10-20 International system of electrode placement. Electrical activity was acquired at a sampling rate of 500Hz  and reviewed with a high frequency filter of 70Hz  and a low frequency filter of 1Hz . EEG data were recorded continuously and digitally stored. Description: The posterior dominant rhythm consists of 9 Hz activity of moderate voltage (25-35 uV) seen predominantly in posterior head regions,  symmetric and reactive to eye opening and eye closing. Sleep was characterized by vertex waves, sleep spindles (12 to 14 Hz), maximal frontocentral region. Hyperventilation and photic stimulation were not performed.   IMPRESSION: This study is within normal limits. No seizures or epileptiform discharges were seen throughout the recording. [employer] (From admission, onward)     Start     Ordered   03/24/21 0500  CBC  Tomorrow morning,   R        03/23/21 1517   03/24/21 5397  Basic metabolic panel  Daily,   R        03/23/21 1517            Vitals/Pain Today's Vitals   03/23/21 1600 03/23/21 1800 03/23/21 1831 03/23/21 1900  BP: (!) 153/68 (!) 144/92 (!) 152/77 (!) 157/69  Pulse:  93 93 78  Resp: 14  13 16 20   Temp:    97.9 F (36.6 C)  TempSrc:    Oral  SpO2: 100% 98% 99% 97%  Weight:      Height:      PainSc:        Isolation Precautions No active isolations  Medications Medications  levETIRAcetam (KEPPRA) tablet 500 mg (500 mg Oral Given 03/23/21 0838)  enoxaparin (LOVENOX) injection 40 mg (40 mg Subcutaneous Given 03/23/21 1143)  acetaminophen (TYLENOL) tablet 650 mg (has no administration in time range)    Or  acetaminophen (TYLENOL) suppository 650 mg (has no administration in time range)  amLODipine (NORVASC) tablet 5 mg (5 mg Oral Given 03/23/21 1142)  hydrALAZINE (APRESOLINE) tablet 25 mg (has no administration in time range)  levETIRAcetam (KEPPRA) IVPB 1000 mg/100 mL premix (0 mg Intravenous Stopped 03/22/21 1938)  sodium chloride 0.9 % bolus 1,000 mL (0 mLs Intravenous Stopped 03/22/21 2341)    Mobility walks with device High fall risk   Focused Assessments    R Recommendations: See Admitting Provider Note  Report given to:   Additional Notes:

## 2021-03-23 NOTE — Progress Notes (Signed)
OT Cancellation Note  Patient Details Name: Thomas Kline MRN: [id number] DOB: [birth year]   Cancelled Treatment:    Reason Eval/Treat Not Completed: Patient at procedure or test/ unavailable. Patient getting EEG.  Allysson Rinehimer L Shulamit Donofrio 03/23/2021, 3:23 PM

## 2021-03-23 NOTE — Evaluation (Addendum)
Physical Therapy Evaluation Patient Details Name: Thomas Kline MRN: [id number] DOB: Apr 27, 1950 Today's Date: 03/23/2021  History of Present Illness  Thomas Kline is a 71 y.o. male presents after found lying on the ground. Pt appears to have been evaluated 2 days prior for possible seizure-like activity on 03/20/20.  CT head negative. PMH: seizure, HTN, diverticulosis, intestinal polyps, hemorrhoids, anemia, diabetes, GERD   Clinical Impression  Pt admitted with above diagnosis. Pt answers a&o questions appropriately, though unable to recall home set up and repeats "I'm so confused" multiple times during eval. Pt requires min guard for safety with transfers and no AD. Attempted to cue pt to ambulate but very difficult, pt able to take 3 lateral sidesteps to [facility] with max multimodal cues then returns to sitting reporting "nothing feels right". In current state, recommending CIR; pt could improve to home with HHPT or no f/u pending improvement in cognition and progress with acute PT. Pt currently with functional limitations due to the deficits listed below (see PT Problem List). Pt will benefit from skilled PT to increase their independence and safety with mobility to allow discharge to the venue listed below.          Recommendations for follow up therapy are one component of a multi-disciplinary discharge planning process, led by the attending physician.  Recommendations may be updated based on patient status, additional functional criteria and insurance authorization.  Follow Up Recommendations Acute inpatient rehab (3hours/day) (possibly HHPT pending progress, cognitive improvement)    Assistance Recommended at Discharge Frequent or constant Supervision/Assistance  Patient can return home with the following       Equipment Recommendations None recommended by PT  Recommendations for Other Services       Functional Status Assessment Patient has had a recent decline in their functional status  and demonstrates the ability to make significant improvements in function in a reasonable and predictable amount of time.     Precautions / Restrictions Precautions Precautions: Fall Restrictions Weight Bearing Restrictions: No      Mobility  Bed Mobility Overal bed mobility: Needs Assistance Bed Mobility: Supine to Sit;Sit to Supine  Supine to sit: Supervision Sit to supine: Supervision  General bed mobility comments: increasead time, use of bedrail to upright trunk and reposition self, therapist managing all cords/lines for safety    Transfers Overall transfer level: Needs assistance Equipment used: None Transfers: Sit to/from Stand Sit to Stand: Min guard  General transfer comment: no physical assist, slightly unsteady    Ambulation/Gait Ambulation/Gait assistance: Min guard  Gait Pattern/deviations: Step-to pattern  General Gait Details: cued pt for ambulation but pt appears confused and doesn't take steps forward/back or lateral, with heavy multimodal cues able to take 3 steps up to [employer] with min guard then returns to sitting, pt reports "nothing feels right" and returns to supine without elaborating  Stairs            Wheelchair Mobility    Modified Rankin (Stroke Patients Only)       Balance Overall balance assessment: Needs assistance Sitting-balance support: Feet supported Sitting balance-Leahy Scale: Good Sitting balance - Comments: seated EOB   Standing balance support: During functional activity Standing balance-Leahy Scale: Fair       Pertinent Vitals/Pain Pain Assessment: Faces Faces Pain Scale: Hurts a little bit Pain Location: "my whole body hurts" Pain Descriptors / Indicators:  ("nothing feels right") Pain Intervention(s): Limited activity within patient's tolerance;Monitored during session    Thomas Kline expects to be discharged to::  Unsure  Additional Comments: Per chart review, pt is homeless, but pt states he lives  at home, unable to provide any details    Prior Function Prior Level of Function : Independent/Modified Independent  Mobility Comments: pt reports amb independently without AD, works ADLs Comments: pt reports ind with ADLs/IADls     Hand Dominance        Extremity/Trunk Assessment   Upper Extremity Assessment Upper Extremity Assessment: Defer to OT evaluation    Lower Extremity Assessment Lower Extremity Assessment: Generalized weakness (AROM WNL, strength 4-/5 hip and knees, strength 3+/5 ankle DF/PF, denies numbness/tingling, symmetrical)    Cervical / Trunk Assessment Cervical / Trunk Assessment: Normal  Communication   Communication: No difficulties  Cognition Arousal/Alertness: Awake/alert Behavior During Therapy: WFL for tasks assessed/performed Overall Cognitive Status: No family/caregiver present to determine baseline cognitive functioning  General Comments: pt a&o x4 with questions, but repeats "I don't know how I got here" "Why am I here" and "I'm so confused" throughout eval; pt appears confused when cued to ambulate, requires max multimodal cues        General Comments General comments (skin integrity, edema, etc.): Pt BP 123/105 at beginning of treatment, denies dizziness, headache or lightheadedness- RN aware; pulse 90s and SpO2 >92% on RA throughout eval    Exercises     Assessment/Plan    PT Assessment Patient needs continued PT services  PT Problem List Decreased strength;Decreased activity tolerance;Decreased balance;Decreased cognition;Decreased safety awareness;Pain       PT Treatment Interventions DME instruction;Gait training;Stair training;Functional mobility training;Therapeutic activities;Therapeutic exercise;Balance training;Cognitive remediation;Patient/family education    PT Goals (Current goals can be found in the Care Plan section)  Acute Rehab PT Goals Patient Stated Goal: "I'm so confused" PT Goal Formulation: With patient Time For  Goal Achievement: 04/06/21 Potential to Achieve Goals: Good    Frequency Min 3X/week     Co-evaluation               AM-PAC PT "6 Clicks" Mobility  Outcome Measure Help needed turning from your back to your side while in a flat bed without using bedrails?: A Little Help needed moving from lying on your back to sitting on the side of a flat bed without using bedrails?: A Little Help needed moving to and from a bed to a chair (including a wheelchair)?: A Little Help needed standing up from a chair using your arms (e.g., wheelchair or bedside chair)?: A Little Help needed to walk in hospital room?: A Little Help needed climbing 3-5 steps with a railing? : A Lot 6 Click Score: 17    End of Session   Activity Tolerance: Patient tolerated treatment well Patient left: in bed;with call bell/phone within reach Nurse Communication: Mobility status;Other (comment) (BP) PT Visit Diagnosis: Unsteadiness on feet (R26.81);Other abnormalities of gait and mobility (R26.89);Muscle weakness (generalized) (M62.81)    Time: 0370-4888 PT Time Calculation (min) (ACUTE ONLY): 21 min   Charges:   PT Evaluation $PT Eval Low Complexity: 1 Low           Tori Mckenzy Salazar PT, DPT 03/23/21, 3:08 PM

## 2021-03-23 NOTE — Procedures (Signed)
Patient Name: Thomas Kline  MRN: [id number]  Epilepsy Attending: Lora Havens  Referring Physician/Provider: Dr Derrick Ravel Date: 03/23/2021 Duration: 23.30 mins  Patient history: 71yo m with breakthrough seizure. EEG to evaluate for seizure  Level of alertness: Awake, asleep  AEDs during EEG study: LEV  Technical aspects: This EEG study was done with scalp electrodes positioned according to the 10-20 International system of electrode placement. Electrical activity was acquired at a sampling rate of 500Hz  and reviewed with a high frequency filter of 70Hz  and a low frequency filter of 1Hz . EEG data were recorded continuously and digitally stored.   Description: The posterior dominant rhythm consists of 9 Hz activity of moderate voltage (25-35 uV) seen predominantly in posterior head regions, symmetric and reactive to eye opening and eye closing. Sleep was characterized by vertex waves, sleep spindles (12 to 14 Hz), maximal frontocentral region. Hyperventilation and photic stimulation were not performed.     IMPRESSION: This study is within normal limits. No seizures or epileptiform discharges were seen throughout the recording.  Thomas Kline

## 2021-03-23 NOTE — H&P (Signed)
History and Physical    Thomas Kline ZHY:[id number] DOB: 07-24-1950 DOA: 03/22/2021  PCP: Gifford Shave, MD  Chief Complaint: Altered mental status  HPI: Thomas Kline is a 71 y.o. male with medical history significant of seizure disorder, hypertension, pancolonic diverticulosis, intestinal polyps, hemorrhoids, anemia, diet-controlled type 2 diabetes, GERD.  Recently seen in the ED on 03/20/2021 for seizure [place] bus stop.  He returned to the ED yesterday via EMS after he was found lying in a field by a truck stop.  He still had an IV and hospital bracelet which was placed during his ED visit 2 days ago.  He was oriented to self only.  He had multiple abrasions to the face and scalp and was incontinent of stool and urine.  Patient was confused on arrival to the ED which was felt to be likely postictal confusion.  Vital signs stable.  Labs showing no leukocytosis.  Hemoglobin slightly low but improved compared to prior labs.  Platelet count 418k.  Bicarb 16, anion gap 20.  Blood glucose 70.  BUN 27, creatinine 1.5 (baseline 0.9-1.2).  LFTs normal.  UA and UDS pending.  Blood ethanol level undetectable.  COVID and influenza PCR negative.  Chest x-ray negative for acute finding.  CT head negative for acute finding. Case was discussed with neurology and patient was given loading dose IV Keppra 1 g.  Neurology recommended official consultation in the morning if his mental status fails to return to baseline.  Patient was also given 1 L normal saline bolus.  Patient is confused.  He is not sure why he is here.  States he lives on the streets and does not have a home.  He does not take any medications.  Initially stated he drinks a lot of alcohol but later stated he only drinks 2 bottles of beer in a week.  No additional history could be obtained from him.  Review of Systems:  All systems reviewed and apart from history of presenting illness, are negative.  Past Medical History:  Diagnosis Date   GERD  (gastroesophageal reflux disease)    Hypertension    Intestinal polyps    colonoscopy from 05/06/11: multiple polyps and internal and external hemorrhoids. along with pan diverticulosis   Pancolonic diverticulosis 05/06/11   diagnosed on colonoscopy from 04/2011 along with multiple polyps and internal and external hemorrhoids.    Seizures [facility])     Past Surgical History:  Procedure Laterality Date   COLONOSCOPY  05/06/2011   Procedure: COLONOSCOPY;  Surgeon: Beryle Beams, MD;  Location: [place];  Service: Endoscopy;  Laterality: N/A;   COMPUTER ASSISTED IMAGE GUIDE NAVIGATION Bilateral 08/03/2019   Procedure: Bilateral Computer Assisted Image Guide Navigation;  Surgeon: Melida Quitter, MD;  Location: [place];  Service: ENT;  Laterality: Bilateral;   ETHMOIDECTOMY Bilateral 08/03/2019   Procedure: Bilateral total Ethmoidectomy;  Surgeon: Melida Quitter, MD;  Location: [place];  Service: ENT;  Laterality: Bilateral;   FRONTAL SINUS EXPLORATION Bilateral 08/03/2019   Procedure: Bilateral Frontal Sinus Exploration;  Surgeon: Melida Quitter, MD;  Location: [place];  Service: ENT;  Laterality: Bilateral;   MAXILLARY ANTROSTOMY Bilateral 08/03/2019   Procedure: Bilateral Maxillary Antrostomy with tissue removal;  Surgeon: Melida Quitter, MD;  Location: [place];  Service: ENT;  Laterality: Bilateral;   SPHENOIDECTOMY Bilateral 08/03/2019   Procedure: Bilateral Sphenoidectomy with tissue removal;  Surgeon: Melida Quitter, MD;  Location: [place];  Service: ENT;  Laterality: Bilateral;     reports that he has quit  smoking. He has never used smokeless tobacco. He reports current alcohol use. He reports that he does not use drugs.  No Known Allergies  History reviewed. No pertinent family history.  Prior to Admission medications   Medication Sig Start Date End Date Taking? Authorizing Provider  amLODipine (NORVASC) 5 MG tablet Take 1 tablet (5 mg total) by mouth daily. 03/20/21 06/18/21  Blanchie Dessert, MD   ferrous sulfate 325 (65 FE) MG tablet Take 1 tablet (325 mg total) by mouth daily with breakfast. 08/06/19   Lattie Haw, MD  fluticasone (FLONASE) 50 MCG/ACT nasal spray Place 2 sprays into both nostrils daily as needed for allergies or rhinitis.    [provider]  hydrochlorothiazide (HYDRODIURIL) 25 MG tablet Take 1 tablet (25 mg total) by mouth daily. 03/20/21 06/18/21  Blanchie Dessert, MD  levETIRAcetam (KEPPRA) 500 MG tablet Take 1 tablet (500 mg total) by mouth 2 (two) times daily. 03/20/21   Blanchie Dessert, MD  loratadine (CLARITIN) 10 MG tablet Take 1 tablet (10 mg total) by mouth daily. 10/23/17   Nuala Alpha, MD  montelukast (SINGULAIR) 10 MG tablet Take 1 tablet (10 mg total) by mouth at bedtime. 10/23/17   Nuala Alpha, MD  pantoprazole (PROTONIX) 20 MG tablet Take 1 tablet (20 mg total) by mouth 2 (two) times daily. 10/06/17   Nuala Alpha, MD  sodium chloride (OCEAN) 0.65 % SOLN nasal spray Place 1 spray into both nostrils as needed for congestion. 08/06/19   Wilber Oliphant, MD    Physical Exam: Vitals:   03/22/21 1700 03/22/21 1900 03/22/21 2100 03/22/21 2300  BP: 133/81 (!) 163/77 (!) 153/74 135/73  Pulse: 84 (!) 36 83 78  Resp: 12 16 15 16   Temp:      TempSrc:      SpO2: 100% 100% 100%   Weight:      Height:        Physical Exam Constitutional:      General: He is not in acute distress. HENT:     Head: Normocephalic and atraumatic.  Eyes:     Extraocular Movements: Extraocular movements intact.     Conjunctiva/sclera: Conjunctivae normal.  Cardiovascular:     Rate and Rhythm: Normal rate and regular rhythm.     Pulses: Normal pulses.  Pulmonary:     Effort: Pulmonary effort is normal. No respiratory distress.     Breath sounds: Normal breath sounds. No wheezing or rales.  Abdominal:     General: Bowel sounds are normal. There is no distension.     Palpations: Abdomen is soft.     Tenderness: There is no abdominal tenderness.   Musculoskeletal:        General: No swelling or tenderness.     Cervical back: Normal range of motion and neck supple.  Skin:    General: Skin is warm and dry.  Neurological:     General: No focal deficit present.     Mental Status: He is alert and oriented to person, place, and time.     Labs on Admission: I have personally reviewed following labs and imaging studies  CBC: Recent Labs  Lab 03/20/21 1421 03/22/21 1440  WBC 5.7 10.5  HGB 11.3* 12.3*  HCT 36.6* 41.8  MCV 82.1 85.3  PLT 365 924*   Basic Metabolic Panel: Recent Labs  Lab 03/20/21 1421 03/22/21 1440  NA 137 140  K 3.6 4.1  CL 103 104  CO2 22 16*  GLUCOSE 160* 70  BUN 11 27*  CREATININE 1.22 1.54*  CALCIUM 8.9 9.1   GFR: Estimated Creatinine Clearance: 53.4 mL/min (A) (by C-G formula based on SCr of 1.54 mg/dL (H)). Liver Function Tests: Recent Labs  Lab 03/20/21 1421 03/22/21 1440  AST 23 39  ALT 16 19  ALKPHOS 91 89  BILITOT 0.7 1.2  PROT 7.1 7.9  ALBUMIN 3.6 4.1   No results for input(s): LIPASE, AMYLASE in the last 168 hours. No results for input(s): AMMONIA in the last 168 hours. Coagulation Profile: No results for input(s): INR, PROTIME in the last 168 hours. Cardiac Enzymes: No results for input(s): CKTOTAL, CKMB, CKMBINDEX, TROPONINI in the last 168 hours. BNP (last 3 results) No results for input(s): PROBNP in the last 8760 hours. HbA1C: No results for input(s): HGBA1C in the last 72 hours. CBG: Recent Labs  Lab 03/22/21 1641  GLUCAP 71   Lipid Profile: No results for input(s): CHOL, HDL, LDLCALC, TRIG, CHOLHDL, LDLDIRECT in the last 72 hours. Thyroid Function Tests: No results for input(s): TSH, T4TOTAL, FREET4, T3FREE, THYROIDAB in the last 72 hours. Anemia Panel: No results for input(s): VITAMINB12, FOLATE, FERRITIN, TIBC, IRON, RETICCTPCT in the last 72 hours. Urine analysis:    Component Value Date/Time   COLORURINE STRAW (A) 08/01/2019 2034   APPEARANCEUR CLEAR  08/01/2019 2034   LABSPEC 1.014 08/01/2019 2034   PHURINE 5.0 08/01/2019 2034   GLUCOSEU 50 (A) 08/01/2019 2034   HGBUR NEGATIVE 08/01/2019 2034   BILIRUBINUR NEGATIVE 08/01/2019 2034   [place] NEGATIVE 08/01/2019 2034   PROTEINUR 30 (A) 08/01/2019 2034   UROBILINOGEN 0.2 05/04/2011 1316   NITRITE NEGATIVE 08/01/2019 2034   LEUKOCYTESUR NEGATIVE 08/01/2019 2034    Radiological Exams on Admission: CT Head Wo Contrast  Result Date: 03/22/2021 CLINICAL DATA:  Head trauma in a 71 year old male. EXAM: CT HEAD WITHOUT CONTRAST TECHNIQUE: Contiguous axial images were obtained from the base of the skull through the vertex without intravenous contrast. Radiation dose reduction: This exam was performed according to the departmental dose-optimization program which includes automated exposure control, adjustment of the mA and/or kV according to patient size and/or use of iterative reconstruction technique. COMPARISON:  Comparison is made with MRI of the brain of June 04, 2019. Also with head CT of March 20, 2021. FINDINGS: Brain: No evidence of acute infarction, hemorrhage, hydrocephalus, extra-axial collection or mass lesion/mass effect. Signs of atrophy and chronic microvascular ischemic change are similar to prior imaging. Vascular: No hyperdense vessel or unexpected calcification. Skull: Normal. Negative for fracture or focal lesion. Sinuses/Orbits: Diffuse paranasal sinus disease improved compared to more remote imaging involving all visualized paranasal sinuses to some extent, as far back as 2012 there was complete opacification of the paranasal sinuses. Some improvement perhaps since recent comparison imaging from March 20, 2021 with continued polypoid appearance of many of these areas. Other: None IMPRESSION: No acute intracranial abnormality with signs of chronic microvascular ischemic change and atrophy as before. Paranasal sinus disease perhaps slightly improved compared to recent imaging and  markedly improved compared to prior studies. Nodular appearance particularly in the sphenoid sinuses may reflect inspissated secretions, sequela of fungal infection or even neoplasm. Direct visualization if not yet performed on follow-up may be helpful as stated previously. Electronically Signed   By: Zetta Bills M.D.   On: 03/22/2021 18:09   DG Chest Port 1 View  Result Date: 03/22/2021 CLINICAL DATA:  weakness EXAM: PORTABLE CHEST 1 VIEW COMPARISON:  Chest x-ray 05/03/2011 FINDINGS: Slightly prominent cardiomediastinal silhouette likely due to AP portable technique.  Otherwise the heart and mediastinal contours are within normal limits. No focal consolidation. No pulmonary edema. No pleural effusion. No pneumothorax. No acute osseous abnormality. IMPRESSION: Slightly prominent cardiomediastinal silhouette likely due to AP portable technique. No active disease. Electronically Signed   By: Iven Finn M.D.   On: 03/22/2021 17:15    EKG: Independently reviewed.  Poor quality study with artifact.  Repeat EKG ordered and currently pending.  Assessment/Plan Principal Problem:   Seizure ([place]) Active Problems:   Hypertension   AKI (acute kidney injury) ([place])   Metabolic acidosis   Type 2 diabetes mellitus ([place])   Breakthrough seizures Due to antiepileptic drug noncompliance.  He was seen in the ED for the same problem 3 days ago with negative head CT and metabolic work-up.  Blood ethanol level undetectable but no other signs of alcohol withdrawal at this time.  Work-up done at this time not suggestive of infection.  No severe hypoglycemia.  Repeat CT head negative for acute finding.  Patient continues to be confused, likely postictal confusion given his history.  He was given loading dose IV Keppra 1 g in the ED and Keppra level was not checked.  No recurrence of seizure-like activity in the ED.  Neurology recommended official consultation in the morning if his mental status fails to return to  baseline. -Resume home Keppra 500 mg twice daily.  EEG ordered.  Seizure precautions.  UDS pending.  AKI Likely prerenal from dehydration.  BUN 27, creatinine 1.5 (baseline 0.9-1.2). -IV fluid hydration and monitor renal function.  Avoid nephrotoxic agents.  High anion gap metabolic acidosis Likely due to seizures.  -IV fluid hydration, repeat labs.  Check lactic acid level.  Hypertension Stable. -Pharmacy med rec pending.  Diet controlled type 2 diabetes A1c 6.9 in May 2021. -Repeat A1c  Paranasal sinus disease CT showing paranasal sinus disease which appears improved compared to prior studies.  Showing nodular appearance in the sphenoid sinuses suspicious for inspissated secretions, sequela of fungal infection, or neoplasm. -Outpatient ENT follow-up  DVT prophylaxis: Lovenox Code Status: Full code by default.  Patient is confused, please readdress in the morning after his mental status improves. Family Communication: No family available at this time. Disposition Plan: Status is: Observation  The patient remains OBS appropriate and will d/c before 2 midnights.  Level of care: Level of care: Telemetry  The medical decision making on this patient was of high complexity and the patient is at high risk for clinical deterioration, therefore this is a level 3 visit.  Shela Leff MD [facility]  If 7PM-7AM, please contact night-coverage [email or web address]  03/23/2021, 3:52 AM

## 2021-03-23 NOTE — Progress Notes (Signed)
PROGRESS NOTE    Thomas Kline  TWS:[id number] DOB: 1951-01-01 DOA: 03/22/2021 PCP: Gifford Shave, MD    Brief Narrative:  Thomas Kline is a 71 year old male with past medical history significant for seizure disorder, essential hypertension, pancolonic diverticulosis, intestinal polyps, hemorrhoids, anemia, type 2 diabetes mellitus, GERD who presented to [facility] ED on 1/5 via EMS as he was found lying in a field by a truck stop.  He was noted to have multiple abrasions to the face and scalp and incontinent of stool and urine.  He also had a IVN hospital bracelet which was placed during his previous ED visit 2 days ago.  Patient was only oriented to self and remained confused on arrival to the ED which was felt likely be postictal confusion.  He states he lives on the streets and does not have a home.  Does not take any medications.  Reports he drinks 2 bottles of beer a week.  No other information could be obtained due to his current mental status.  In the ED, temperature 98.0 F, HR 92, RR 18, BP 138/81, SPO2 96% on room air.  Sodium 140, potassium 4.1, chloride 104, CO2 16, glucose 70, BUN 27, creatinine 1.54, AST 39, ALT 19, total bilirubin 1.2.  WBC 10.5, hemoglobin 13.3, platelets 14.  Lactic acid 1.1.  COVID-19 PCR negative.  Lactic acid 1.1.  Influenza A/B PCR negative.  Urinalysis unrevealing.  EtOH level less than 10.  UDS negative.  CT head without contrast with no acute intracranial normality with signs of chronic microvascular ischemic change and atrophy.  Chest x-ray with slightly prominent cardiomediastinal silhouette, no active disease.   Assessment & Plan:   Principal Problem:   Seizure ([place]) Active Problems:   Hypertension   AKI (acute kidney injury) ([place])   Metabolic acidosis   Type 2 diabetes mellitus ([place])   Acute metabolic encephalopathy, POA Breakthrough seizure Patient presenting to ED via EMS after being found down in the field.  He was confused, likely postictal state  as he is noncompliant with medical therapy/antiepileptics.  Recently seen in the ED 2 days prior and discharged home with Melburn Popper.  Evaluation the ED with negative head CT, normal EtOH level and no signs of active infectious process.  Blood sugar within normal limits, UDS negative..  Neurology was consulted and recommended loading dose IV Keppra 1 g followed by resumption of home Keppra 500 twice daily. --EEG: Pending --Keppra 500 mg p.o. twice daily --Seizure precautions --Monitor on telemetry  Acute renal failure Creatinine on admission elevated 1.54, likely prerenal azotemia in the setting of dehydration and postictal state from seizure as above.  Baseline creatinine 0.9-1.2. --Cr 1.54>1.08 --Continue IVF hydration  High anion gap metabolic acidosis: Resolved Anion gap elevated at 20 admission.  Lactic acid 1.1.  Likely secondary to seizure as above.  Anion gap now down to 13, resolved. --Supportive care  Essential hypertension BP 175/65 this morning. --Start amlodipine 5 mg p.o. daily. --Hydralazine 25 mg p.o. q8h PRN SBP >170 or DBP >110  Type 2 diabetes mellitus Hemoglobin A1c 6.0, well controlled.  Diet controlled at home. --SSI for coverage    DVT prophylaxis: enoxaparin (LOVENOX) injection 40 mg Start: 03/23/21 1000   Code Status: Full Code Family Communication: No family present at bedside this morning  Disposition Plan:  Level of care: Telemetry Status is: Observation  The patient remains OBS appropriate and will d/c before 2 midnights.   Consultants:  Neurology -Case was discussed by ED physician  Procedures:  EEG: Pending  Antimicrobials:  None   Subjective: Patient seen examined at bedside, resting comfortably.  Remains pleasantly confused in ED holding area.  No specific complaints this morning.  Reports he is homeless and does not take any medication.  Denies headache, no fever/chills/night sweats, no nausea cefonicid diarrhea, no chest pain, palpitations,  no abdominal pain, no weakness, no fatigue, no paresthesias.  No acute events overnight per nursing staff.  Objective: Vitals:   03/23/21 0356 03/23/21 0725 03/23/21 1139 03/23/21 1200  BP: (!) 175/65 (!) 166/82 (!) 164/81 (!) 157/80  Pulse: 85 66 74   Resp: 15 18 13 14   Temp:      TempSrc:      SpO2: 100% 100% 100%   Weight:      Height:        Intake/Output Summary (Last 24 hours) at 03/23/2021 1429 Last data filed at 03/23/2021 0805 Gross per 24 hour  Intake 1618 ml  Output --  Net 1618 ml   Filed Weights   03/22/21 1433  Weight: 101 kg    Examination:  General exam: Appears calm and comfortable, pleasantly confused Respiratory system: Clear to auscultation. Respiratory effort normal. Cardiovascular system: S1 & S2 heard, RRR. No JVD, murmurs, rubs, gallops or clicks. No pedal edema. Gastrointestinal system: Abdomen is nondistended, soft and nontender. No organomegaly or masses felt. Normal bowel sounds heard. Central nervous system: Alert, oriented to place (hospital), time (2023), but not person or situation, No focal neurological deficits. Extremities: Symmetric 5 x 5 power. Skin: No rashes, lesions or ulcers Psychiatry: Judgement and insight appear poor. Mood & affect appropriate.     Data Reviewed: I have personally reviewed following labs and imaging studies  CBC: Recent Labs  Lab 03/20/21 1421 03/22/21 1440 03/23/21 0351  WBC 5.7 10.5 6.8  HGB 11.3* 12.3* 8.6*  HCT 36.6* 41.8 28.7*  MCV 82.1 85.3 83.9  PLT 365 418* 154   Basic Metabolic Panel: Recent Labs  Lab 03/20/21 1421 03/22/21 1440 03/23/21 0351  NA 137 140 140  K 3.6 4.1 3.8  CL 103 104 108  CO2 22 16* 19*  GLUCOSE 160* 70 76  BUN 11 27* 23  CREATININE 1.22 1.54* 1.08  CALCIUM 8.9 9.1 8.5*   GFR: Estimated Creatinine Clearance: 76.1 mL/min (by C-G formula based on SCr of 1.08 mg/dL). Liver Function Tests: Recent Labs  Lab 03/20/21 1421 03/22/21 1440  AST 23 39  ALT 16 19   ALKPHOS 91 89  BILITOT 0.7 1.2  PROT 7.1 7.9  ALBUMIN 3.6 4.1   No results for input(s): LIPASE, AMYLASE in the last 168 hours. No results for input(s): AMMONIA in the last 168 hours. Coagulation Profile: No results for input(s): INR, PROTIME in the last 168 hours. Cardiac Enzymes: No results for input(s): CKTOTAL, CKMB, CKMBINDEX, TROPONINI in the last 168 hours. BNP (last 3 results) No results for input(s): PROBNP in the last 8760 hours. HbA1C: Recent Labs    03/23/21 0351  HGBA1C 6.0*   CBG: Recent Labs  Lab 03/22/21 1641  GLUCAP 71   Lipid Profile: No results for input(s): CHOL, HDL, LDLCALC, TRIG, CHOLHDL, LDLDIRECT in the last 72 hours. Thyroid Function Tests: No results for input(s): TSH, T4TOTAL, FREET4, T3FREE, THYROIDAB in the last 72 hours. Anemia Panel: No results for input(s): VITAMINB12, FOLATE, FERRITIN, TIBC, IRON, RETICCTPCT in the last 72 hours. Sepsis Labs: Recent Labs  Lab 03/23/21 0351  LATICACIDVEN 1.1    Recent Results (from the past 240 hour(s))  Resp Panel by RT-PCR (Flu A&B, Covid) Nasopharyngeal Swab     Status: None   Collection Time: 03/22/21  7:28 PM   Specimen: Nasopharyngeal Swab; Nasopharyngeal(NP) swabs in vial transport medium  Result Value Ref Range Status   SARS Coronavirus 2 by RT PCR NEGATIVE NEGATIVE Final    Comment: (NOTE) SARS-CoV-2 target nucleic acids are NOT DETECTED.  The SARS-CoV-2 RNA is generally detectable in upper respiratory specimens during the acute phase of infection. The lowest concentration of SARS-CoV-2 viral copies this assay can detect is 138 copies/mL. A negative result does not preclude SARS-Cov-2 infection and should not be used as the sole basis for treatment or other patient management decisions. A negative result may occur with  improper specimen collection/handling, submission of specimen other than nasopharyngeal swab, presence of viral mutation(s) within the areas targeted by this assay, and  inadequate number of viral copies(<138 copies/mL). A negative result must be combined with clinical observations, patient history, and epidemiological information. The expected result is Negative.  Fact Sheet for Patients:  EntrepreneurPulse.com.au  Fact Sheet for Healthcare Providers:  IncredibleEmployment.be  This test is no t yet approved or cleared by the [place] FDA and  has been authorized for detection and/or diagnosis of SARS-CoV-2 by FDA under an Emergency Use Authorization (EUA). This EUA will remain  in effect (meaning this test can be used) for the duration of the COVID-19 declaration under Section 564(b)(1) of the Act, 21 U.S.C.section 360bbb-3(b)(1), unless the authorization is terminated  or revoked sooner.       Influenza A by PCR NEGATIVE NEGATIVE Final   Influenza B by PCR NEGATIVE NEGATIVE Final    Comment: (NOTE) The Xpert Xpress SARS-CoV-2/FLU/RSV plus assay is intended as an aid in the diagnosis of influenza from Nasopharyngeal swab specimens and should not be used as a sole basis for treatment. Nasal washings and aspirates are unacceptable for Xpert Xpress SARS-CoV-2/FLU/RSV testing.  Fact Sheet for Patients: EntrepreneurPulse.com.au  Fact Sheet for Healthcare Providers: IncredibleEmployment.be  This test is not yet approved or cleared by the [place] FDA and has been authorized for detection and/or diagnosis of SARS-CoV-2 by FDA under an Emergency Use Authorization (EUA). This EUA will remain in effect (meaning this test can be used) for the duration of the COVID-19 declaration under Section 564(b)(1) of the Act, 21 U.S.C. section 360bbb-3(b)(1), unless the authorization is terminated or revoked.  Performed at [facility], [place] [address], [place], [place] [address]          Radiology Studies: CT Head Wo Contrast  Result Date:  03/22/2021 CLINICAL DATA:  Head trauma in a 71 year old male. EXAM: CT HEAD WITHOUT CONTRAST TECHNIQUE: Contiguous axial images were obtained from the base of the skull through the vertex without intravenous contrast. Radiation dose reduction: This exam was performed according to the departmental dose-optimization program which includes automated exposure control, adjustment of the mA and/or kV according to patient size and/or use of iterative reconstruction technique. COMPARISON:  Comparison is made with MRI of the brain of June 04, 2019. Also with head CT of March 20, 2021. FINDINGS: Brain: No evidence of acute infarction, hemorrhage, hydrocephalus, extra-axial collection or mass lesion/mass effect. Signs of atrophy and chronic microvascular ischemic change are similar to prior imaging. Vascular: No hyperdense vessel or unexpected calcification. Skull: Normal. Negative for fracture or focal lesion. Sinuses/Orbits: Diffuse paranasal sinus disease improved compared to more remote imaging involving all visualized paranasal sinuses to some extent, as far back as 2012 there  was complete opacification of the paranasal sinuses. Some improvement perhaps since recent comparison imaging from March 20, 2021 with continued polypoid appearance of many of these areas. Other: None IMPRESSION: No acute intracranial abnormality with signs of chronic microvascular ischemic change and atrophy as before. Paranasal sinus disease perhaps slightly improved compared to recent imaging and markedly improved compared to prior studies. Nodular appearance particularly in the sphenoid sinuses may reflect inspissated secretions, sequela of fungal infection or even neoplasm. Direct visualization if not yet performed on follow-up may be helpful as stated previously. Electronically Signed   By: Zetta Bills M.D.   On: 03/22/2021 18:09   DG Chest Port 1 View  Result Date: 03/22/2021 CLINICAL DATA:  weakness EXAM: PORTABLE CHEST 1 VIEW  COMPARISON:  Chest x-ray 05/03/2011 FINDINGS: Slightly prominent cardiomediastinal silhouette likely due to AP portable technique. Otherwise the heart and mediastinal contours are within normal limits. No focal consolidation. No pulmonary edema. No pleural effusion. No pneumothorax. No acute osseous abnormality. IMPRESSION: Slightly prominent cardiomediastinal silhouette likely due to AP portable technique. No active disease. Electronically Signed   By: Iven Finn M.D.   On: 03/22/2021 17:15        Scheduled Meds:  amLODipine  5 mg Oral Daily   enoxaparin (LOVENOX) injection  40 mg Subcutaneous Q24H   levETIRAcetam  500 mg Oral BID   Continuous Infusions:   LOS: 0 days    Time spent: 46 minutes spent on chart review, discussion with nursing staff, consultants, updating family and interview/physical exam; more than 50% of that time was spent in counseling and/or coordination of care.    Valerian Jewel J [place] DO [facility] Available via Epic secure chat 7am-7pm After these hours, please refer to coverage provider listed on [email or web address] 03/23/2021, 2:29 PM

## 2021-03-24 DIAGNOSIS — R569 Unspecified convulsions: Secondary | ICD-10-CM | POA: Diagnosis not present

## 2021-03-24 LAB — BASIC METABOLIC PANEL WITH GFR
Anion gap: 7 (ref 5–15)
BUN: 17 mg/dL (ref 8–23)
CO2: 27 mmol/L (ref 22–32)
Calcium: 8.5 mg/dL — ABNORMAL LOW (ref 8.9–10.3)
Chloride: 102 mmol/L (ref 98–111)
Creatinine, Ser: 0.99 mg/dL (ref 0.61–1.24)
GFR, Estimated: >60 mL/min
Glucose, Bld: 120 mg/dL — ABNORMAL HIGH (ref 70–99)
Potassium: 3.7 mmol/L (ref 3.5–5.1)
Sodium: 136 mmol/L (ref 135–145)

## 2021-03-24 LAB — CBC
HCT: 33.7 % — ABNORMAL LOW (ref 39.0–52.0)
Hemoglobin: 10.3 g/dL — ABNORMAL LOW (ref 13.0–17.0)
MCH: 25 pg — ABNORMAL LOW (ref 26.0–34.0)
MCHC: 30.6 g/dL (ref 30.0–36.0)
MCV: 81.8 fL (ref 80.0–100.0)
Platelets: 360 K/uL (ref 150–400)
RBC: 4.12 MIL/uL — ABNORMAL LOW (ref 4.22–5.81)
RDW: 17.3 % — ABNORMAL HIGH (ref 11.5–15.5)
WBC: 9.5 K/uL (ref 4.0–10.5)
nRBC: 0 % (ref 0.0–0.2)

## 2021-03-24 MED ORDER — AMLODIPINE BESYLATE 10 MG PO TABS
10.0000 mg | ORAL_TABLET | Freq: Every day | ORAL | Status: DC
  Administered 2021-03-24 – 2021-03-27 (×4): 10 mg via ORAL
  Filled 2021-03-24 (×4): qty 1

## 2021-03-24 MED ORDER — METOCLOPRAMIDE HCL 5 MG/ML IJ SOLN
10.0000 mg | Freq: Once | INTRAMUSCULAR | Status: AC
  Administered 2021-03-24: 10 mg via INTRAVENOUS
  Filled 2021-03-24: qty 2

## 2021-03-24 NOTE — Evaluation (Signed)
Occupational Therapy Evaluation Patient Details Name: Thomas Kline MRN: [id number] DOB: [birth year] Today's Date: 03/24/2021   History of Present Illness Thomas Kline is a 71 y.o. male presents after found lying on the ground. Pt appears to have been evaluated 2 days prior for possible seizure-like activity on 03/20/20.  CT head negative. PMH: seizure, HTN, diverticulosis, intestinal polyps, hemorrhoids, anemia, diabetes, GERD   Clinical Impression   Patient is a 71 year old male who was noted to have had a significant decline in the ability to participate in ADLs. Patient was noted to have increased BLE buckling with functional mobility, decreased safety awareness, decreased functional activity tolerance, decreased endurance impacting participation in ADLs. Patient is noted in chart to be homeless with unclear d/c location at this time. Patient's cognitioin was hard to assess with no friends or family present to verify at this time. Patient would continue to benefit from skilled OT services at this time while admitted and after d/c to address noted deficits in order to improve overall safety and independence in ADLs.       Recommendations for follow up therapy are one component of a multi-disciplinary discharge planning process, led by the attending physician.  Recommendations may be updated based on patient status, additional functional criteria and insurance authorization.   Follow Up Recommendations  Acute inpatient rehab (3hours/day)    Assistance Recommended at Discharge Frequent or constant Supervision/Assistance  Patient can return home with the following A little help with walking and/or transfers;A little help with bathing/dressing/bathroom;Direct supervision/assist for medications management;Direct supervision/assist for financial management;Help with stairs or ramp for entrance;Assist for transportation    Functional Status Assessment  Patient has had a recent decline in their  functional status and demonstrates the ability to make significant improvements in function in a reasonable and predictable amount of time.  Equipment Recommendations  Other (comment) (TBD)    Recommendations for Other Services       Precautions / Restrictions Precautions Precautions: Fall Precaution Comments: knees buckle with movement Restrictions Weight Bearing Restrictions: No      Mobility Bed Mobility Overal bed mobility: Needs Assistance Bed Mobility: Supine to Sit;Sit to Supine     Supine to sit: Supervision     General bed mobility comments: patient needed increased time for supine to sit on edge of bed.    Transfers                          Balance Overall balance assessment: Needs assistance Sitting-balance support: Feet supported Sitting balance-Leahy Scale: Good     Standing balance support: During functional activity Standing balance-Leahy Scale: Fair Standing balance comment: standing at sink for UB bathing and grooming tasks no AD                           ADL either performed or assessed with clinical judgement   ADL Overall ADL's : Needs assistance/impaired Eating/Feeding: Modified independent;Sitting   Grooming: Wash/dry face;Wash/dry hands;Oral care;Standing Grooming Details (indicate cue type and reason): at sink with no AD Upper Body Bathing: Min guard;Standing Upper Body Bathing Details (indicate cue type and reason): at sink Lower Body Bathing: Minimal assistance;Sit to/from stand Lower Body Bathing Details (indicate cue type and reason): patient declined to wash LB at this time. Upper Body Dressing : Set up;Sitting   Lower Body Dressing: Minimal assistance;Sit to/from stand Lower Body Dressing Details (indicate cue type and reason): patient was able to  don/doff bilateral socks sitting on edge of bed. Toilet Transfer: Magazine features editor Details (indicate cue type and reason): patient was able to transfer from  recliner at sink to bed in room taking few steps to simulate toileting tasks. patient was noted to have BLE buckle with increased steps with patient reporting " its not normally this bad". Toileting- Clothing Manipulation and Hygiene: Minimal assistance;Sit to/from stand       Functional mobility during ADLs: Minimal assistance       Vision Patient Visual Report: No change from baseline       Perception     Praxis      Pertinent Vitals/Pain Pain Assessment: Faces Faces Pain Scale: Hurts a little bit Pain Location: "my whole body hurts" Pain Descriptors / Indicators:  (unable to describe) Pain Intervention(s): Monitored during session     Hand Dominance Right   Extremity/Trunk Assessment Upper Extremity Assessment Upper Extremity Assessment: Overall WFL for tasks assessed   Lower Extremity Assessment Lower Extremity Assessment: Defer to PT evaluation   Cervical / Trunk Assessment Cervical / Trunk Assessment: Normal   Communication Communication Communication: No difficulties   Cognition Arousal/Alertness: Awake/alert Behavior During Therapy: WFL for tasks assessed/performed Overall Cognitive Status: No family/caregiver present to determine baseline cognitive functioning                                 General Comments: patient is difficult to assess with long pauses between responses. patient reported having moved to the [place] from [place] but unable to provide any more information past that statement.     General Comments       Exercises     Shoulder Instructions      Home Living Family/patient expects to be discharged to:: Unsure                                 Additional Comments: Per chart review, pt is homeless, but pt states he lives at home, unable to provide any details      Prior Functioning/Environment Prior Level of Function : Independent/Modified Independent             Mobility Comments: pt reports amb  independently without AD, works ADLs Comments: pt reports ind with ADLs/IADls        OT Problem List: Decreased activity tolerance;Impaired balance (sitting and/or standing);Decreased safety awareness;Decreased knowledge of precautions;Decreased knowledge of use of DME or AE      OT Treatment/Interventions: Self-care/ADL training;Neuromuscular education;Energy conservation;DME and/or AE instruction;Therapeutic activities;Balance training;Patient/family education    OT Goals(Current goals can be found in the care plan section) Acute Rehab OT Goals Patient Stated Goal: to get moving again OT Goal Formulation: With patient Time For Goal Achievement: 04/07/21 Potential to Achieve Goals: Good  OT Frequency: Min 2X/week    Co-evaluation              AM-PAC OT "6 Clicks" Daily Activity     Outcome Measure Help from another person eating meals?: None Help from another person taking care of personal grooming?: A Little Help from another person toileting, which includes using toliet, bedpan, or urinal?: A Little Help from another person bathing (including washing, rinsing, drying)?: A Little Help from another person to put on and taking off regular upper body clothing?: A Little Help from another person to put on and taking off regular lower  body clothing?: A Little 6 Click Score: 19   End of Session Equipment Utilized During Treatment: Gait belt Nurse Communication: Mobility status  Activity Tolerance: Patient tolerated treatment well Patient left: in chair;with call bell/phone within reach;with chair alarm set  OT Visit Diagnosis: Unsteadiness on feet (R26.81);Other abnormalities of gait and mobility (R26.89);History of falling (Z91.81)                Time: 9767-3419 OT Time Calculation (min): 26 min Charges:  OT General Charges $OT Visit: 1 Visit OT Evaluation $OT Eval Low Complexity: 1 Low OT Treatments $Self Care/Home Management : 8-22 mins  Jackelyn Poling OTR/L, MS Acute  Rehabilitation Department Office# [phone] Pager# [phone]   Marcellina Millin 03/24/2021, 4:58 PM

## 2021-03-24 NOTE — Progress Notes (Signed)
PROGRESS NOTE    Rudolf Blizard  CZY:[id number] DOB: [birth year] DOA: 03/22/2021 PCP: Gifford Shave, MD    Brief Narrative:  Thomas Kline is a 71 year old male with past medical history significant for seizure disorder, essential hypertension, pancolonic diverticulosis, intestinal polyps, hemorrhoids, anemia, type 2 diabetes mellitus, GERD who presented to [facility] ED on 1/5 via EMS as he was found lying in a field by a truck stop.  He was noted to have multiple abrasions to the face and scalp and incontinent of stool and urine.  He also had a IVN hospital bracelet which was placed during his previous ED visit 2 days ago.  Patient was only oriented to self and remained confused on arrival to the ED which was felt likely be postictal confusion.  He states [place] on the streets and does not have a home.  Does not take any medications.  Reports he drinks 2 bottles of beer a week.  No other information could be obtained due to his current mental status.  In the ED, temperature 98.0 F, HR 92, RR 18, BP 138/81, SPO2 96% on room air.  Sodium 140, potassium 4.1, chloride 104, CO2 16, glucose 70, BUN 27, creatinine 1.54, AST 39, ALT 19, total bilirubin 1.2.  WBC 10.5, hemoglobin 13.3, platelets 14.  Lactic acid 1.1.  COVID-19 PCR negative.  Lactic acid 1.1.  Influenza A/B PCR negative.  Urinalysis unrevealing.  EtOH level less than 10.  UDS negative.  CT head without contrast with no acute intracranial normality with signs of chronic microvascular ischemic change and atrophy.  Chest x-ray with slightly prominent cardiomediastinal silhouette, no active disease.   Assessment & Plan:   Principal Problem:   Seizure ([place]) Active Problems:   Hypertension   AKI (acute kidney injury) ([place])   Metabolic acidosis   Type 2 diabetes mellitus ([place])   Acute metabolic encephalopathy, POA Breakthrough seizure Patient presenting to ED via EMS after being found down in the field.  He was confused, likely postictal state  as he is noncompliant with medical therapy/antiepileptics.  Recently seen in the ED 2 days prior and discharged home with Melburn Popper.  Evaluation the ED with negative head CT, normal EtOH level and no signs of active infectious process.  Blood sugar within normal limits, UDS negative..  Neurology was consulted and recommended loading dose IV Keppra 1 g followed by resumption of home Keppra 500 twice daily.  EEG with no seizure or epileptiform discharges. --Keppra 500 mg p.o. twice daily --Seizure precautions --Monitor on telemetry  Acute renal failure: Resolved Creatinine on admission elevated 1.54, likely prerenal azotemia in the setting of dehydration and postictal state from seizure as above.  Baseline creatinine 0.9-1.2.  Started on IV fluid hydration with resolution of renal failure, now discontinued. --Cr 1.54>1.08>0.99  High anion gap metabolic acidosis: Resolved Anion gap elevated at 20 admission.  Lactic acid 1.1.  Likely secondary to seizure as above.  Anion gap now down to 13, resolved. --Supportive care  Essential hypertension BP 161/74 this morning. --Increased amlodipine to 10 mg p.o. daily today --Hydralazine 25 mg p.o. q8h PRN SBP >170 or DBP >110 --Continue to monitor BP closely, may need to add a second agent if remains poorly controlled  Type 2 diabetes mellitus Hemoglobin A1c 6.0, well controlled.  Diet controlled at home. --SSI for coverage  Weakness/debility/deconditioning: --Seen by PT, with recommendations of CIR --OT consult: Pending --Referral placed for CIR for evaluation    DVT prophylaxis: enoxaparin (LOVENOX) injection 40 mg Start: 03/23/21  1000   Code Status: Full Code Family Communication: No family present at bedside this morning  Disposition Plan:  Level of care: Telemetry Status is: Observation  The patient remains OBS appropriate and will d/c before 2 midnights.   Consultants:  Neurology - Case discussed by ED physician  Procedures:  EEG:    Antimicrobials:  None   Subjective: Patient seen examined at bedside, resting comfortably.  Confusion now resolved.  Continues with weakness.  Seen by PT yesterday with recommendations of CIR admission.  No other specific complaints or concerns at this time.  Denies headache, no fever/chills/night sweats, no nausea cefonicid diarrhea, no chest pain, palpitations, no abdominal pain, no weakness, no fatigue, no paresthesias.  No acute events overnight per nursing staff.  Objective: Vitals:   03/23/21 2200 03/24/21 0149 03/24/21 0619 03/24/21 0951  BP: (!) 156/84 (!) 155/86 (!) 161/74 (!) 157/105  Pulse: 88 89 69 93  Resp: 20 20 20 14   Temp: 98.2 F (36.8 C) 98.5 F (36.9 C) 98 F (36.7 C) (!) 97.5 F (36.4 C)  TempSrc: Oral Oral Oral Oral  SpO2: 99% 100% 100% 100%  Weight:      Height:        Intake/Output Summary (Last 24 hours) at 03/24/2021 1229 Last data filed at 03/24/2021 1200 Gross per 24 hour  Intake 240 ml  Output 1050 ml  Net -810 ml   Filed Weights   03/22/21 1433  Weight: 101 kg    Examination:  General exam: Appears calm and comfortable Respiratory system: Clear to auscultation. Respiratory effort normal.  On room air Cardiovascular system: S1 & S2 heard, RRR. No JVD, murmurs, rubs, gallops or clicks. No pedal edema. Gastrointestinal system: Abdomen is nondistended, soft and nontender. No organomegaly or masses felt. Normal bowel sounds heard. Central nervous system: Alert, oriented to place (hospital), time (2023), and person.  No focal neurological deficits. Extremities: Symmetric 5 x 5 power. Skin: No rashes, lesions or ulcers Psychiatry: Judgement and insight appear poor. Mood & affect appropriate.     Data Reviewed: I have personally reviewed following labs and imaging studies  CBC: Recent Labs  Lab 03/20/21 1421 03/22/21 1440 03/23/21 0351 03/24/21 0355  WBC 5.7 10.5 6.8 9.5  HGB 11.3* 12.3* 8.6* 10.3*  HCT 36.6* 41.8 28.7* 33.7*  MCV 82.1  85.3 83.9 81.8  PLT 365 418* 289 867   Basic Metabolic Panel: Recent Labs  Lab 03/20/21 1421 03/22/21 1440 03/23/21 0351 03/24/21 0355  NA 137 140 140 136  K 3.6 4.1 3.8 3.7  CL 103 104 108 102  CO2 22 16* 19* 27  GLUCOSE 160* 70 76 120*  BUN 11 27* 23 17  CREATININE 1.22 1.54* 1.08 0.99  CALCIUM 8.9 9.1 8.5* 8.5*   GFR: Estimated Creatinine Clearance: 83 mL/min (by C-G formula based on SCr of 0.99 mg/dL). Liver Function Tests: Recent Labs  Lab 03/20/21 1421 03/22/21 1440  AST 23 39  ALT 16 19  ALKPHOS 91 89  BILITOT 0.7 1.2  PROT 7.1 7.9  ALBUMIN 3.6 4.1   No results for input(s): LIPASE, AMYLASE in the last 168 hours. No results for input(s): AMMONIA in the last 168 hours. Coagulation Profile: No results for input(s): INR, PROTIME in the last 168 hours. Cardiac Enzymes: No results for input(s): CKTOTAL, CKMB, CKMBINDEX, TROPONINI in the last 168 hours. BNP (last 3 results) No results for input(s): PROBNP in the last 8760 hours. HbA1C: Recent Labs    03/23/21 0351  HGBA1C  6.0*   CBG: Recent Labs  Lab 03/22/21 1641 03/23/21 1719  GLUCAP 71 96   Lipid Profile: No results for input(s): CHOL, HDL, LDLCALC, TRIG, CHOLHDL, LDLDIRECT in the last 72 hours. Thyroid Function Tests: No results for input(s): TSH, T4TOTAL, FREET4, T3FREE, THYROIDAB in the last 72 hours. Anemia Panel: No results for input(s): VITAMINB12, FOLATE, FERRITIN, TIBC, IRON, RETICCTPCT in the last 72 hours. Sepsis Labs: Recent Labs  Lab 03/23/21 0351  LATICACIDVEN 1.1    Recent Results (from the past 240 hour(s))  Resp Panel by RT-PCR (Flu A&B, Covid) Nasopharyngeal Swab     Status: None   Collection Time: 03/22/21  7:28 PM   Specimen: Nasopharyngeal Swab; Nasopharyngeal(NP) swabs in vial transport medium  Result Value Ref Range Status   SARS Coronavirus 2 by RT PCR NEGATIVE NEGATIVE Final    Comment: (NOTE) SARS-CoV-2 target nucleic acids are NOT DETECTED.  The SARS-CoV-2 RNA  is generally detectable in upper respiratory specimens during the acute phase of infection. The lowest concentration of SARS-CoV-2 viral copies this assay can detect is 138 copies/mL. A negative result does not preclude SARS-Cov-2 infection and should not be used as the sole basis for treatment or other patient management decisions. A negative result may occur with  improper specimen collection/handling, submission of specimen other than nasopharyngeal swab, presence of viral mutation(s) within the areas targeted by this assay, and inadequate number of viral copies(<138 copies/mL). A negative result must be combined with clinical observations, patient history, and epidemiological information. The expected result is Negative.  Fact Sheet for Patients:  EntrepreneurPulse.com.au  Fact Sheet for Healthcare Providers:  IncredibleEmployment.be  This test is no t yet approved or cleared by the [place] FDA and  has been authorized for detection and/or diagnosis of SARS-CoV-2 by FDA under an Emergency Use Authorization (EUA). This EUA will remain  in effect (meaning this test can be used) for the duration of the COVID-19 declaration under Section 564(b)(1) of the Act, 21 U.S.C.section 360bbb-3(b)(1), unless the authorization is terminated  or revoked sooner.       Influenza A by PCR NEGATIVE NEGATIVE Final   Influenza B by PCR NEGATIVE NEGATIVE Final    Comment: (NOTE) The Xpert Xpress SARS-CoV-2/FLU/RSV plus assay is intended as an aid in the diagnosis of influenza from Nasopharyngeal swab specimens and should not be used as a sole basis for treatment. Nasal washings and aspirates are unacceptable for Xpert Xpress SARS-CoV-2/FLU/RSV testing.  Fact Sheet for Patients: EntrepreneurPulse.com.au  Fact Sheet for Healthcare Providers: IncredibleEmployment.be  This test is not yet approved or cleared by the  [place] FDA and has been authorized for detection and/or diagnosis of SARS-CoV-2 by FDA under an Emergency Use Authorization (EUA). This EUA will remain in effect (meaning this test can be used) for the duration of the COVID-19 declaration under Section 564(b)(1) of the Act, 21 U.S.C. section 360bbb-3(b)(1), unless the authorization is terminated or revoked.  Performed at [facility], [place] [address] [place], [place] [address]          Radiology Studies: CT Head Wo Contrast  Result Date: 03/22/2021 CLINICAL DATA:  Head trauma in a 71 year old male. EXAM: CT HEAD WITHOUT CONTRAST TECHNIQUE: Contiguous axial images were obtained from the base of the skull through the vertex without intravenous contrast. Radiation dose reduction: This exam was performed according to the departmental dose-optimization program which includes automated exposure control, adjustment of the mA and/or kV according to patient size and/or use of iterative reconstruction  technique. COMPARISON:  Comparison is made with MRI of the brain of June 04, 2019. Also with head CT of March 20, 2021. FINDINGS: Brain: No evidence of acute infarction, hemorrhage, hydrocephalus, extra-axial collection or mass lesion/mass effect. Signs of atrophy and chronic microvascular ischemic change are similar to prior imaging. Vascular: No hyperdense vessel or unexpected calcification. Skull: Normal. Negative for fracture or focal lesion. Sinuses/Orbits: Diffuse paranasal sinus disease improved compared to more remote imaging involving all visualized paranasal sinuses to some extent, as far back as 2012 there was complete opacification of the paranasal sinuses. Some improvement perhaps since recent comparison imaging from March 20, 2021 with continued polypoid appearance of many of these areas. Other: None IMPRESSION: No acute intracranial abnormality with signs of chronic microvascular ischemic change and atrophy as  before. Paranasal sinus disease perhaps slightly improved compared to recent imaging and markedly improved compared to prior studies. Nodular appearance particularly in the sphenoid sinuses may reflect inspissated secretions, sequela of fungal infection or even neoplasm. Direct visualization if not yet performed on follow-up may be helpful as stated previously. Electronically Signed   By: Zetta Bills M.D.   On: 03/22/2021 18:09   DG Chest Port 1 View  Result Date: 03/22/2021 CLINICAL DATA:  weakness EXAM: PORTABLE CHEST 1 VIEW COMPARISON:  Chest x-ray 05/03/2011 FINDINGS: Slightly prominent cardiomediastinal silhouette likely due to AP portable technique. Otherwise the heart and mediastinal contours are within normal limits. No focal consolidation. No pulmonary edema. No pleural effusion. No pneumothorax. No acute osseous abnormality. IMPRESSION: Slightly prominent cardiomediastinal silhouette likely due to AP portable technique. No active disease. Electronically Signed   By: Iven Finn M.D.   On: 03/22/2021 17:15   EEG adult  Result Date: 03/23/2021 Lora Havens, MD     03/23/2021  3:47 PM Patient Name: Yanixan Mellinger MRN: [id number] Epilepsy Attending: Lora Havens Referring Physician/Provider: Dr Derrick Ravel Date: 03/23/2021 Duration: 23.30 mins Patient history: 71yo m with breakthrough seizure. EEG to evaluate for seizure Level of alertness: Awake, asleep AEDs during EEG study: LEV Technical aspects: This EEG study was done with scalp electrodes positioned according to the 10-20 International system of electrode placement. Electrical activity was acquired at a sampling rate of 500Hz  and reviewed with a high frequency filter of 70Hz  and a low frequency filter of 1Hz . EEG data were recorded continuously and digitally stored. Description: The posterior dominant rhythm consists of 9 Hz activity of moderate voltage (25-35 uV) seen predominantly in posterior head regions, symmetric and reactive  to eye opening and eye closing. Sleep was characterized by vertex waves, sleep spindles (12 to 14 Hz), maximal frontocentral region. Hyperventilation and photic stimulation were not performed.   IMPRESSION: This study is within normal limits. No seizures or epileptiform discharges were seen throughout the recording. Priyanka Barbra Sarks        Scheduled Meds:  amLODipine  10 mg Oral Daily   enoxaparin (LOVENOX) injection  40 mg Subcutaneous Q24H   levETIRAcetam  500 mg Oral BID   Continuous Infusions:   LOS: 0 days    Time spent: 38 minutes spent on chart review, discussion with nursing staff, consultants, updating family and interview/physical exam; more than 50% of that time was spent in counseling and/or coordination of care.    Keymari Sato J [place] DO [facility] Available via Epic secure chat 7am-7pm After these hours, please refer to coverage provider listed on [email or web address] 03/24/2021, 12:29 PM

## 2021-03-24 NOTE — Progress Notes (Incomplete)
Pt alert to self, place and time, follows command, pt able to answer some of the admission questions. No family relatives on file to call. This RN unable to complete admission requirements at this time because of pt's mentation.

## 2021-03-24 NOTE — Progress Notes (Signed)
Patient ID: Thomas Kline, male   DOB: [birth year], 71 y.o.   MRN: [id number] Patient C/O hiccups for last 4 hours, will try IV reglan once. Phillips Climes MD

## 2021-03-25 DIAGNOSIS — R569 Unspecified convulsions: Secondary | ICD-10-CM | POA: Diagnosis not present

## 2021-03-25 NOTE — Progress Notes (Signed)
Pt has had hiccups for an extended amount of time. Was first noticed around 1030 am and last heard around 1730. No complaints from pt and MD notified.  Thomas Kline

## 2021-03-25 NOTE — Progress Notes (Signed)
PROGRESS NOTE    Thomas Kline  UMP:[id number] DOB: September 13, 1950 DOA: 03/22/2021 PCP: Gifford Shave, MD    Brief Narrative:  Thomas Kline is a 71 year old male with past medical history significant for seizure disorder, essential hypertension, pancolonic diverticulosis, intestinal polyps, hemorrhoids, anemia, type 2 diabetes mellitus, GERD who presented to [facility] ED on 1/5 via EMS as he was found lying in a field by a truck stop.  He was noted to have multiple abrasions to the face and scalp and incontinent of stool and urine.  He also had a IVN hospital bracelet which was placed during his previous ED visit 2 days ago.  Patient was only oriented to self and remained confused on arrival to the ED which was felt likely be postictal confusion.  He states [place] on the streets and does not have a home.  Does not take any medications.  Reports he drinks 2 bottles of beer a week.  No other information could be obtained due to his current mental status.  In the ED, temperature 98.0 F, HR 92, RR 18, BP 138/81, SPO2 96% on room air.  Sodium 140, potassium 4.1, chloride 104, CO2 16, glucose 70, BUN 27, creatinine 1.54, AST 39, ALT 19, total bilirubin 1.2.  WBC 10.5, hemoglobin 13.3, platelets 14.  Lactic acid 1.1.  COVID-19 PCR negative.  Lactic acid 1.1.  Influenza A/B PCR negative.  Urinalysis unrevealing.  EtOH level less than 10.  UDS negative.  CT head without contrast with no acute intracranial normality with signs of chronic microvascular ischemic change and atrophy.  Chest x-ray with slightly prominent cardiomediastinal silhouette, no active disease.   Assessment & Plan:   Principal Problem:   Seizure ([place]) Active Problems:   Hypertension   AKI (acute kidney injury) ([place])   Metabolic acidosis   Type 2 diabetes mellitus ([place])   Acute metabolic encephalopathy, POA Breakthrough seizure Patient presenting to ED via EMS after being found down in the field.  He was confused, likely postictal state  as he is noncompliant with medical therapy/antiepileptics.  Recently seen in the ED 2 days prior and discharged home with Melburn Popper.  Evaluation the ED with negative head CT, normal EtOH level and no signs of active infectious process.  Blood sugar within normal limits, UDS negative..  Neurology was consulted and recommended loading dose IV Keppra 1 g followed by resumption of home Keppra 500 twice daily.  EEG with no seizure or epileptiform discharges. --Keppra 500 mg p.o. twice daily --Seizure precautions --Monitor on telemetry  Acute renal failure, POA: Resolved Creatinine on admission elevated 1.54, likely prerenal azotemia in the setting of dehydration and postictal state from seizure as above.  Baseline creatinine 0.9-1.2.  Started on IV fluid hydration with resolution of renal failure, now discontinued. --Cr 1.54>1.08>0.99  High anion gap metabolic acidosis: Resolved Anion gap elevated at 20 admission.  Lactic acid 1.1.  Likely secondary to seizure as above.  Anion gap now down to 13, resolved. --Supportive care  Essential hypertension BP 136/84 this morning. --Amlodipine 10 mg p.o. daily --Hydralazine 25 mg p.o. q8h PRN SBP >170 or DBP >110  Type 2 diabetes mellitus Hemoglobin A1c 6.0, well controlled.  Diet controlled at home. --SSI for coverage  Weakness/debility/deconditioning: --Seen by PT/OT, with recommendations of CIR --Referral placed for CIR for evaluation; but given on inpatient status not a candidate.  Patient not safe for discharge home at this time due to concerns of living situation and repeat ED presentation/hospitalization within 2 days of discharge  after being found in the field by EMS.  Awaiting TOC evaluation.    DVT prophylaxis: enoxaparin (LOVENOX) injection 40 mg Start: 03/23/21 1000   Code Status: Full Code Family Communication: No family present at bedside this morning attempted to contact patient's friend listed in chart; Thomas Kline; no  answer.  Disposition Plan:  Level of care: Telemetry Status is: Observation  The patient remains OBS appropriate and will d/c before 2 midnights.  Patient is currently unsafe disposition to discharge as unclear living situation, patient unable to communicate appropriately and remains intermittently confused.  Patient was recently discharged from the ED with return within 2 days after being found down in the field.  Unclear if he is able to manage his chronic medical conditions alone.  Awaiting TOC evaluation.  Unable to get in contact with patient contacts listed in chart, no answer.   Consultants:  Neurology - Case discussed by ED physician  Procedures:  EEG:   Antimicrobials:  None   Subjective: Patient seen examined at bedside, resting comfortably.  Asking for assistance ordering his breakfast, unable to utilize telephone.  Continues with intermittent confusion and weakness.  Therapy recommended CIR, but given his observation status not a candidate.  Patient unable to tell me where he lives.  No other specific complaints or concerns at this time.  Denies headache, no fever/chills/night sweats, no nausea cefonicid diarrhea, no chest pain, palpitations, no abdominal pain, no weakness, no fatigue, no paresthesias.  No acute events overnight per nursing staff.  Objective: Vitals:   03/24/21 1338 03/24/21 2143 03/25/21 0552 03/25/21 0959  BP: 123/73 (!) 151/80 136/84 136/71  Pulse: 80 77 88   Resp: 16 18 18    Temp: 98.3 F (36.8 C) (!) 97.5 F (36.4 C) 98.7 F (37.1 C)   TempSrc: Oral Oral Oral   SpO2: 99% 98% 99%   Weight:      Height:        Intake/Output Summary (Last 24 hours) at 03/25/2021 1145 Last data filed at 03/25/2021 0900 Gross per 24 hour  Intake 1280 ml  Output 2300 ml  Net -1020 ml   Filed Weights   03/22/21 1433  Weight: 101 kg    Examination:  General exam: Appears calm and comfortable, chronically ill in appearance Respiratory system: Clear to  auscultation. Respiratory effort normal.  On room air Cardiovascular system: S1 & S2 heard, RRR. No JVD, murmurs, rubs, gallops or clicks. No pedal edema. Gastrointestinal system: Abdomen is nondistended, soft and nontender. No organomegaly or masses felt. Normal bowel sounds heard. Central nervous system: Alert, oriented to place (hospital), time (2023).  No focal neurological deficits. Extremities: Symmetric 5 x 5 power. Skin: No rashes, lesions or ulcers Psychiatry: Judgement and insight appear poor. Mood & affect appropriate.     Data Reviewed: I have personally reviewed following labs and imaging studies  CBC: Recent Labs  Lab 03/20/21 1421 03/22/21 1440 03/23/21 0351 03/24/21 0355  WBC 5.7 10.5 6.8 9.5  HGB 11.3* 12.3* 8.6* 10.3*  HCT 36.6* 41.8 28.7* 33.7*  MCV 82.1 85.3 83.9 81.8  PLT 365 418* 289 440   Basic Metabolic Panel: Recent Labs  Lab 03/20/21 1421 03/22/21 1440 03/23/21 0351 03/24/21 0355  NA 137 140 140 136  K 3.6 4.1 3.8 3.7  CL 103 104 108 102  CO2 22 16* 19* 27  GLUCOSE 160* 70 76 120*  BUN 11 27* 23 17  CREATININE 1.22 1.54* 1.08 0.99  CALCIUM 8.9 9.1 8.5* 8.5*  GFR: Estimated Creatinine Clearance: 83 mL/min (by C-G formula based on SCr of 0.99 mg/dL). Liver Function Tests: Recent Labs  Lab 03/20/21 1421 03/22/21 1440  AST 23 39  ALT 16 19  ALKPHOS 91 89  BILITOT 0.7 1.2  PROT 7.1 7.9  ALBUMIN 3.6 4.1   No results for input(s): LIPASE, AMYLASE in the last 168 hours. No results for input(s): AMMONIA in the last 168 hours. Coagulation Profile: No results for input(s): INR, PROTIME in the last 168 hours. Cardiac Enzymes: No results for input(s): CKTOTAL, CKMB, CKMBINDEX, TROPONINI in the last 168 hours. BNP (last 3 results) No results for input(s): PROBNP in the last 8760 hours. HbA1C: Recent Labs    03/23/21 0351  HGBA1C 6.0*   CBG: Recent Labs  Lab 03/22/21 1641 03/23/21 1719  GLUCAP 71 96   Lipid Profile: No results  for input(s): CHOL, HDL, LDLCALC, TRIG, CHOLHDL, LDLDIRECT in the last 72 hours. Thyroid Function Tests: No results for input(s): TSH, T4TOTAL, FREET4, T3FREE, THYROIDAB in the last 72 hours. Anemia Panel: No results for input(s): VITAMINB12, FOLATE, FERRITIN, TIBC, IRON, RETICCTPCT in the last 72 hours. Sepsis Labs: Recent Labs  Lab 03/23/21 0351  LATICACIDVEN 1.1    Recent Results (from the past 240 hour(s))  Resp Panel by RT-PCR (Flu A&B, Covid) Nasopharyngeal Swab     Status: None   Collection Time: 03/22/21  7:28 PM   Specimen: Nasopharyngeal Swab; Nasopharyngeal(NP) swabs in vial transport medium  Result Value Ref Range Status   SARS Coronavirus 2 by RT PCR NEGATIVE NEGATIVE Final    Comment: (NOTE) SARS-CoV-2 target nucleic acids are NOT DETECTED.  The SARS-CoV-2 RNA is generally detectable in upper respiratory specimens during the acute phase of infection. The lowest concentration of SARS-CoV-2 viral copies this assay can detect is 138 copies/mL. A negative result does not preclude SARS-Cov-2 infection and should not be used as the sole basis for treatment or other patient management decisions. A negative result may occur with  improper specimen collection/handling, submission of specimen other than nasopharyngeal swab, presence of viral mutation(s) within the areas targeted by this assay, and inadequate number of viral copies(<138 copies/mL). A negative result must be combined with clinical observations, patient history, and epidemiological information. The expected result is Negative.  Fact Sheet for Patients:  EntrepreneurPulse.com.au  Fact Sheet for Healthcare Providers:  IncredibleEmployment.be  This test is no t yet approved or cleared by the [place] FDA and  has been authorized for detection and/or diagnosis of SARS-CoV-2 by FDA under an Emergency Use Authorization (EUA). This EUA will remain  in effect (meaning this  test can be used) for the duration of the COVID-19 declaration under Section 564(b)(1) of the Act, 21 U.S.C.section 360bbb-3(b)(1), unless the authorization is terminated  or revoked sooner.       Influenza A by PCR NEGATIVE NEGATIVE Final   Influenza B by PCR NEGATIVE NEGATIVE Final    Comment: (NOTE) The Xpert Xpress SARS-CoV-2/FLU/RSV plus assay is intended as an aid in the diagnosis of influenza from Nasopharyngeal swab specimens and should not be used as a sole basis for treatment. Nasal washings and aspirates are unacceptable for Xpert Xpress SARS-CoV-2/FLU/RSV testing.  Fact Sheet for Patients: EntrepreneurPulse.com.au  Fact Sheet for Healthcare Providers: IncredibleEmployment.be  This test is not yet approved or cleared by the [place] FDA and has been authorized for detection and/or diagnosis of SARS-CoV-2 by FDA under an Emergency Use Authorization (EUA). This EUA will remain in effect (meaning this test  can be used) for the duration of the COVID-19 declaration under Section 564(b)(1) of the Act, 21 U.S.C. section 360bbb-3(b)(1), unless the authorization is terminated or revoked.  Performed at [facility], [place] [address], [place], [place] [address]          Radiology Studies: EEG adult  Result Date: 03-26-21 Lora Havens, MD     March 26, 2021  3:47 PM Patient Name: Thomas Kline MRN: [id number] Epilepsy Attending: Lora Havens Referring Physician/Provider: Dr Derrick Ravel Date: 03-26-21 Duration: 23.30 mins Patient history: 71yo m with breakthrough seizure. EEG to evaluate for seizure Level of alertness: Awake, asleep AEDs during EEG study: LEV Technical aspects: This EEG study was done with scalp electrodes positioned according to the 10-20 International system of electrode placement. Electrical activity was acquired at a sampling rate of 500Hz  and reviewed with a high frequency filter of 70Hz   and a low frequency filter of 1Hz . EEG data were recorded continuously and digitally stored. Description: The posterior dominant rhythm consists of 9 Hz activity of moderate voltage (25-35 uV) seen predominantly in posterior head regions, symmetric and reactive to eye opening and eye closing. Sleep was characterized by vertex waves, sleep spindles (12 to 14 Hz), maximal frontocentral region. Hyperventilation and photic stimulation were not performed.   IMPRESSION: This study is within normal limits. No seizures or epileptiform discharges were seen throughout the recording. Priyanka Barbra Sarks        Scheduled Meds:  amLODipine  10 mg Oral Daily   enoxaparin (LOVENOX) injection  40 mg Subcutaneous Q24H   levETIRAcetam  500 mg Oral BID   Continuous Infusions:   LOS: 0 days    Time spent: 38 minutes spent on chart review, discussion with nursing staff, consultants, updating family and interview/physical exam; more than 50% of that time was spent in counseling and/or coordination of care.    Janelle Culton J [place] DO [facility] Available via Epic secure chat 7am-7pm After these hours, please refer to coverage provider listed on [email or web address] 03/25/2021, 11:45 AM

## 2021-03-25 NOTE — Progress Notes (Signed)
Inpatient Rehab Admissions Coordinator:   Per therapy recommendations, patient was screened for CIR candidacy by Clemens Catholic, MS, CCC-SLP. At this time, Pt remains on observation status and does not appear to meet criteria for inpatient admission. I will not pursue for admission at this time; however, if Pt. Status changes to inpatient, I will request rehab MD review case and potentially pursue for CIR admission. Recommend  TOC consider other rehab venues. Please contact me with any questions.  Clemens Catholic, [place], [place] Admissions Coordinator  [phone] ([place]) [phone] (office)

## 2021-03-25 NOTE — Plan of Care (Signed)
  Problem: Nutrition: Goal: Adequate nutrition will be maintained Outcome: Progressing   Problem: Safety: Goal: Ability to remain free from injury will improve Outcome: Progressing   

## 2021-03-26 ENCOUNTER — Other Ambulatory Visit: Payer: Self-pay

## 2021-03-26 DIAGNOSIS — G40909 Epilepsy, unspecified, not intractable, without status epilepticus: Secondary | ICD-10-CM

## 2021-03-26 DIAGNOSIS — Z91199 Patient's noncompliance with other medical treatment and regimen due to unspecified reason: Secondary | ICD-10-CM | POA: Diagnosis not present

## 2021-03-26 DIAGNOSIS — E860 Dehydration: Secondary | ICD-10-CM | POA: Diagnosis present

## 2021-03-26 DIAGNOSIS — X58XXXA Exposure to other specified factors, initial encounter: Secondary | ICD-10-CM | POA: Diagnosis present

## 2021-03-26 DIAGNOSIS — Z9114 Patient's other noncompliance with medication regimen: Secondary | ICD-10-CM | POA: Diagnosis not present

## 2021-03-26 DIAGNOSIS — S0081XA Abrasion of other part of head, initial encounter: Secondary | ICD-10-CM | POA: Diagnosis present

## 2021-03-26 DIAGNOSIS — F05 Delirium due to known physiological condition: Secondary | ICD-10-CM | POA: Diagnosis present

## 2021-03-26 DIAGNOSIS — Z596 Low income: Secondary | ICD-10-CM | POA: Diagnosis not present

## 2021-03-26 DIAGNOSIS — G9341 Metabolic encephalopathy: Secondary | ICD-10-CM | POA: Diagnosis present

## 2021-03-26 DIAGNOSIS — K5730 Diverticulosis of large intestine without perforation or abscess without bleeding: Secondary | ICD-10-CM | POA: Diagnosis present

## 2021-03-26 DIAGNOSIS — I10 Essential (primary) hypertension: Secondary | ICD-10-CM | POA: Diagnosis present

## 2021-03-26 DIAGNOSIS — Z79899 Other long term (current) drug therapy: Secondary | ICD-10-CM | POA: Diagnosis not present

## 2021-03-26 DIAGNOSIS — E119 Type 2 diabetes mellitus without complications: Secondary | ICD-10-CM | POA: Diagnosis present

## 2021-03-26 DIAGNOSIS — Z5902 Unsheltered homelessness: Secondary | ICD-10-CM | POA: Diagnosis not present

## 2021-03-26 DIAGNOSIS — R32 Unspecified urinary incontinence: Secondary | ICD-10-CM | POA: Diagnosis present

## 2021-03-26 DIAGNOSIS — R54 Age-related physical debility: Secondary | ICD-10-CM | POA: Diagnosis present

## 2021-03-26 DIAGNOSIS — Z20822 Contact with and (suspected) exposure to covid-19: Secondary | ICD-10-CM | POA: Diagnosis present

## 2021-03-26 DIAGNOSIS — N179 Acute kidney failure, unspecified: Secondary | ICD-10-CM | POA: Diagnosis present

## 2021-03-26 DIAGNOSIS — E872 Acidosis, unspecified: Secondary | ICD-10-CM | POA: Diagnosis present

## 2021-03-26 DIAGNOSIS — R569 Unspecified convulsions: Secondary | ICD-10-CM | POA: Diagnosis present

## 2021-03-26 NOTE — Progress Notes (Signed)
Physical Therapy Treatment Patient Details Name: Thomas Kline MRN: [id number] DOB: 1950/05/25 Today's Date: 03/26/2021   History of Present Illness Shulem Mader is a 71 y.o. male presented on 03/22/21 after found lying on the ground-likely seizures. Pt appears to have been evaluated 2 days prior for possible seizure-like activity on 03/20/20 and discharged from ED.  CT head negative. PMH: seizure, HTN, diverticulosis, intestinal polyps, hemorrhoids, anemia, diabetes, GERD    PT Comments    In regards to mobility, pt making good progress, but requiring frequent cues and decreased safety awareness.  He was able to ambulate in hallway but required min A for balance.  Pt requiring mod cues for all task for safety and to complete task.  He did score a 8/24 on the DGI indicating high fall risk.   Noted CIR denied pt, updated recommendation to SNF due to decreased cognition, fall risk, and decreased safety awareness. Unsure of baseline cogntion.    Recommendations for follow up therapy are one component of a multi-disciplinary discharge planning process, led by the attending physician.  Recommendations may be updated based on patient status, additional functional criteria and insurance authorization.  Follow Up Recommendations  Skilled nursing-short term rehab (<3 hours/day)     Assistance Recommended at Discharge Frequent or constant Supervision/Assistance  Patient can return home with the following A little help with walking and/or transfers;A little help with bathing/dressing/bathroom;Other (comment);Assist for transportation (would need near 24 hr supervision due to cognition)   Equipment Recommendations  None recommended by PT    Recommendations for Other Services       Precautions / Restrictions Precautions Precautions: Fall     Mobility  Bed Mobility Overal bed mobility: Needs Assistance Bed Mobility: Supine to Sit;Sit to Supine     Supine to sit: Supervision Sit to supine:  Supervision   General bed mobility comments: patient needed increased time for supine to sit on edge of bed.    Transfers Overall transfer level: Needs assistance Equipment used: None   Sit to Stand: Min guard           General transfer comment: no physical assist, slightly unsteady    Ambulation/Gait Ambulation/Gait assistance: Min guard;Min assist Gait Distance (Feet): 100 Feet (100'x3) Assistive device: None Gait Pattern/deviations: Step-through pattern;Wide base of support;Drifts right/left Gait velocity: decreased     General Gait Details: Mild antalgic pattern with unsteadiness requiring min A to correct at times; did take standing rest breaks, cues for safety   Stairs             Wheelchair Mobility    Modified Rankin (Stroke Patients Only)       Balance Overall balance assessment: Needs assistance Sitting-balance support: Feet supported Sitting balance-Leahy Scale: Good     Standing balance support: No upper extremity supported Standing balance-Leahy Scale: Fair Standing balance comment: Did not use AD but did demonstrate unsteadiness with gait and ADLs                 Standardized Balance Assessment Standardized Balance Assessment : Dynamic Gait Index   Dynamic Gait Index Level Surface: Moderate Impairment Change in Gait Speed: Moderate Impairment Gait with Horizontal Head Turns: Moderate Impairment Gait with Vertical Head Turns: Moderate Impairment Gait and Pivot Turn: Moderate Impairment Step Over Obstacle: Moderate Impairment Step Around Obstacles: Moderate Impairment Steps: Moderate Impairment Total Score: 8      Cognition Arousal/Alertness: Awake/alert Behavior During Therapy: WFL for tasks assessed/performed Overall Cognitive Status: No family/caregiver present to determine baseline cognitive  functioning                                 General Comments: Pt requiring increased time to respond and repeated  cues at times.  Seems to have decreased safety awareness.  Also, pt attempting to ambulate into other patients rooms and had difficulty finding his room (after walking in a circle).  He did report he lived on his own but kept perserverating on neighbors "being jealous", but was never able to clearly finish this thought/idea/relevance to PT session/PLOF. Pt also had menu in hand at arrival , asked if he wanted to order lunch before walking (he shook head) but he just kept staring at closed menu, asked if he needed help, pt eventually stated, "no they'll bring it".  Nursing reports have been unable to reach family/friends for baseline.        Exercises      General Comments        Pertinent Vitals/Pain Pain Assessment: Faces Faces Pain Scale: Hurts a little bit Pain Location: stomach    Home Living                          Prior Function            PT Goals (current goals can now be found in the care plan section) Progress towards PT goals: Progressing toward goals    Frequency    Min 3X/week      PT Plan Discharge plan needs to be updated (CIR denied)    Co-evaluation              AM-PAC PT "6 Clicks" Mobility   Outcome Measure  Help needed turning from your back to your side while in a flat bed without using bedrails?: A Little Help needed moving from lying on your back to sitting on the side of a flat bed without using bedrails?: A Little Help needed moving to and from a bed to a chair (including a wheelchair)?: A Little Help needed standing up from a chair using your arms (e.g., wheelchair or bedside chair)?: A Lot (cues) Help needed to walk in hospital room?: A Lot (cues) Help needed climbing 3-5 steps with a railing? : A Lot 6 Click Score: 15    End of Session Equipment Utilized During Treatment: Gait belt Activity Tolerance: Patient tolerated treatment well Patient left: in bed;with call bell/phone within reach;with bed alarm set Nurse  Communication: Mobility status PT Visit Diagnosis: Unsteadiness on feet (R26.81);Other abnormalities of gait and mobility (R26.89);Muscle weakness (generalized) (M62.81)     Time: 5003-7048 PT Time Calculation (min) (ACUTE ONLY): 20 min  Charges:  $Gait Training: 8-22 mins                     Abran Richard, PT Acute Rehab Services Pager [phone] [facility] [place] 03/26/2021, 3:53 PM

## 2021-03-26 NOTE — TOC Initial Note (Addendum)
Transition of Care [facility]) - Initial/Assessment Note    Patient Details  Name: Thomas Kline MRN: [id number] Date of Birth: December 24, 1950  Transition of Care [facility]) CM/SW Contact:    Leeroy Cha, RN Phone Number: 03/26/2021, 8:38 AM  Clinical Narrative:                 Homeless issues P.t. has suggested snf placement. Will see if he is in accordance to go. Spoke with patient alert and reactive.  He does have an apartment at [address]. States that some friends took him to see about a job.  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  Asked him about going to rehab and explained what it is to him.  States he understands and if agreeable with this.  Speaks broken [place] but is understandable and states that he understands what is being said to him. Will send out an fl2 for him and see if any snf's are agreeable to take him for rehab. Fl2 sent out to area snf review.    Patient Details  Name: Thomas Kline Date of Birth: May 23, 1950   Transition of Care [facility]) CM/SW Contact:    Leeroy Cha, RN Phone Number: 03/26/2021, 8:38 AM      Expected Discharge Plan: Homeless Shelter Barriers to Discharge: Continued Medical Work up   Patient Goals and CMS Choice Patient states their goals for this hospitalization and ongoing recovery are:: not stated      Expected Discharge Plan and Services Expected Discharge Plan: Homeless Shelter   Discharge Planning Services: CM Consult   Living arrangements for the past 2 months: Homeless (lives on the streets)                                      Prior Living Arrangements/Services Living arrangements for the past 2 months: Homeless (lives on the streets) Lives with:: Self Patient language and need for interpreter reviewed:: Yes Do you feel safe going back to the place where you live?: Yes            Criminal Activity/Legal Involvement  Pertinent to Current Situation/Hospitalization: No - Comment as needed  Activities of Daily Living Home Assistive Devices/Equipment: None ADL Screening (condition at time of admission) Patient's cognitive ability adequate to safely complete daily activities?: No Is the patient deaf or have difficulty hearing?: No Does the patient have difficulty seeing, even when wearing glasses/contacts?: No Does the patient have difficulty concentrating, remembering, or making decisions?: Yes Patient able to express need for assistance with ADLs?: No Does the patient have difficulty dressing or bathing?: Yes Independently performs ADLs?: No Communication: Independent Dressing (OT): Needs assistance Grooming: Needs assistance Feeding: Independent Bathing: Needs assistance Toileting: Needs assistance In/Out Bed: Needs assistance Does the patient have difficulty walking or climbing stairs?: Yes Weakness of Legs: Both Weakness of Arms/Hands: None  Permission Sought/Granted                  Emotional Assessment Appearance:: Appears stated age Attitude/Demeanor/Rapport: Avoidant Affect (typically observed): Constricted Orientation: : Fluctuating Orientation (Suspected and/or reported Sundowners), Oriented to Place Alcohol / Substance Use: Alcohol Use, Tobacco Use, Illicit Drugs Psych Involvement: No (comment)  Admission diagnosis:  Seizure ([employer]) [R56.9] Post-ictal confusion [F05] Altered mental status, unspecified altered mental status type [R41.82] Patient Active Problem  List   Diagnosis Date Noted   AKI (acute kidney injury) ([place]) 83/15/1761   Metabolic acidosis 60/73/7106   Type 2 diabetes mellitus ([place]) 03/23/2021   Anemia associated with acute blood loss 08/04/2019   Seizure ([place]) 08/02/2019   Polyp of nasal cavity 08/02/2019   Hypertension 08/02/2019   Pancolonic diverticulosis 11/16/2013   Abdominal bloating 11/15/2013   GERD (gastroesophageal reflux disease) 03/22/2012    Lightheadedness 12/09/2011   Multiple nasal polyps 12/09/2011   Hypertension 12/09/2011   Acute blood loss anemia 05/03/2011   Acute lower GI bleeding 05/03/2011   Seizure ([place]) 05/03/2011   PCP:  Gifford Shave, MD Pharmacy:   [facility] [place], [place] [address] Phone: [phone] Fax: [phone]  [facility] [address] [place] [address] Phone: [phone] Fax: [place] [address] [place] [address] Phone: [phone] Fax: [phone]     Social Determinants of Health (SDOH) Interventions    Readmission Risk Interventions No flowsheet data found.

## 2021-03-26 NOTE — Progress Notes (Signed)
Nutrition Brief Note  Patient identified on the Malnutrition Screening Tool (MST) Report with score of 2.0  Wt Readings from Last 15 Encounters:  03/22/21 101 kg  03/20/21 101 kg  08/02/19 101 kg  06/03/19 108 kg  10/23/17 93.8 kg  10/06/17 91.4 kg  06/05/16 96.4 kg  02/28/16 99.2 kg  01/18/16 89.8 kg  06/02/15 94.8 kg  03/27/15 91.3 kg  03/15/15 92.5 kg  10/07/14 90.7 kg  03/21/14 92 kg  01/06/14 92.1 kg    Body mass index is 30.2 kg/m. Patient meets criteria for obesity based on current BMI. Skin WDL.    Current diet order is Heart Healthy and patient has been eating 100% at all meals since admission. Labs and medications reviewed.   No nutrition interventions warranted at this time. If nutrition issues arise, please consult RD.       Jarome Matin, MS, RD, LDN Inpatient Clinical Dietitian RD pager # available in [place]  After hours/weekend pager # available in [facility]

## 2021-03-26 NOTE — Progress Notes (Addendum)
Inpatient Rehab Admissions Coordinator:    Note change to inpatient status. I have reviewed case with rehab MD and do not think Pt. Demonstrates medical necessity to justify CIR admission. I will not pursue admission for this Pt. TOC will need to look towards other rehab venues.   Clemens Catholic, [place], [place] Admissions Coordinator  [phone] ([place]) [phone] (office)

## 2021-03-26 NOTE — Plan of Care (Signed)
°  Problem: Clinical Measurements: °Goal: Respiratory complications will improve °Outcome: Progressing °Goal: Cardiovascular complication will be avoided °Outcome: Progressing °  °Problem: Activity: °Goal: Risk for activity intolerance will decrease °Outcome: Progressing °  °

## 2021-03-26 NOTE — Progress Notes (Addendum)
PROGRESS NOTE    Thomas Kline  SKA:[id number] DOB: 19-Aug-1950 DOA: 03/22/2021 PCP: Gifford Shave, MD    Brief Narrative:  Thomas Kline is a 71 year old male with past medical history significant for seizure disorder, essential hypertension, pancolonic diverticulosis, intestinal polyps, hemorrhoids, anemia, type 2 diabetes mellitus, GERD who presented to [facility] ED on 1/5 via EMS as he was found lying in a field by a truck stop.  He was noted to have multiple abrasions to the face and scalp and incontinent of stool and urine.  He also had a IVN hospital bracelet which was placed during his previous ED visit 2 days ago.  Patient was only oriented to self and remained confused on arrival to the ED which was felt likely be postictal confusion.  He states he lives on the streets and does not have a home.  Does not take any medications.  Reports he drinks 2 bottles of beer a week.  No other information could be obtained due to his current mental status.  In the ED, temperature 98.0 F, HR 92, RR 18, BP 138/81, SPO2 96% on room air.  Sodium 140, potassium 4.1, chloride 104, CO2 16, glucose 70, BUN 27, creatinine 1.54, AST 39, ALT 19, total bilirubin 1.2.  WBC 10.5, hemoglobin 13.3, platelets 14.  Lactic acid 1.1.  COVID-19 PCR negative.  Lactic acid 1.1.  Influenza A/B PCR negative.  Urinalysis unrevealing.  EtOH level less than 10.  UDS negative.  CT head without contrast with no acute intracranial normality with signs of chronic microvascular ischemic change and atrophy.  Chest x-ray with slightly prominent cardiomediastinal silhouette, no active disease.   Assessment & Plan:   Principal Problem:   Seizure ([place]) Active Problems:   Hypertension   AKI (acute kidney injury) ([place])   Metabolic acidosis   Type 2 diabetes mellitus ([place])   Recurrent seizures ([place])   Acute metabolic encephalopathy, POA Breakthrough seizure Patient presenting to ED via EMS after being found down in the field.  He was  confused, likely postictal state as he is noncompliant with medical therapy/antiepileptics.  Recently seen in the ED 2 days prior and discharged home with Melburn Popper.  Evaluation the ED with negative head CT, normal EtOH level and no signs of active infectious process.  Blood sugar within normal limits, UDS negative..  Neurology was consulted and recommended loading dose IV Keppra 1 g followed by resumption of home Keppra 500 twice daily.  EEG with no seizure or epileptiform discharges. --Keppra 500 mg p.o. twice daily --Seizure precautions  Acute renal failure, POA: Resolved Creatinine on admission elevated 1.54, likely prerenal azotemia in the setting of dehydration and postictal state from seizure as above.  Baseline creatinine 0.9-1.2.  Started on IV fluid hydration with resolution of renal failure, now discontinued. --Cr 1.54>1.08>0.99  High anion gap metabolic acidosis: Resolved Anion gap elevated at 20 admission.  Lactic acid 1.1.  Likely secondary to seizure as above.  Anion gap now down to 13, resolved. --Supportive care  Essential hypertension BP 120/72 this morning. --Amlodipine 10 mg p.o. daily --Hydralazine 25 mg p.o. q8h PRN SBP >170 or DBP >110  Type 2 diabetes mellitus Hemoglobin A1c 6.0, well controlled.  Diet controlled at home. --SSI for coverage  Weakness/debility/deconditioning: -- Continue PT/OT efforts while inpatient, pending SNF placement    DVT prophylaxis: enoxaparin (LOVENOX) injection 40 mg Start: 03/23/21 1000   Code Status: Full Code Family Communication: No family present at bedside this morning attempted to contact patient's friend listed in  chart; Hermelinda Dellen; no answer.  Disposition Plan:  Level of care: Med-Surg Status is: Inpatient  Remains inpatient appropriate because: Currently medically stable for discharge when SNF bed available.  Patient not safe for discharge home given his inability to care for himself with recurrent seizures and being  found in the field as patient is homeless.    Consultants:  Neurology - Case discussed by ED physician  Procedures:  EEG:   Antimicrobials:  None   Subjective: Patient seen examined at bedside, resting comfortably.  No complaints this morning.  Reports good sleep overnight.  Pending SNF placement.  No other questions or concerns at this time. Denies headache, no fever/chills/night sweats, no nausea/vomiting/diarrhea, no chest pain, palpitations, no abdominal pain, no weakness, no fatigue, no paresthesias.  No acute events overnight per nursing staff.  Patient is medically stable for discharge to SNF when bed available.  Patient is unsafe discharge home as he is homeless, unable to care for himself, weak with recurrent hospitalizations for recurrent seizures secondary to medication noncompliance.  Objective: Vitals:   03/25/21 1419 03/25/21 1944 03/26/21 0524 03/26/21 0857  BP: 120/75 134/73 (!) 151/93 120/72  Pulse: 85 77 88   Resp: 14  18   Temp: 97.8 F (36.6 C) 98.4 F (36.9 C) 97.8 F (36.6 C)   TempSrc: Oral Oral Oral   SpO2: 100% 100% 98%   Weight:      Height:        Intake/Output Summary (Last 24 hours) at 03/26/2021 1107 Last data filed at 03/26/2021 0942 Gross per 24 hour  Intake 1340 ml  Output 2850 ml  Net -1510 ml   Filed Weights   03/22/21 1433  Weight: 101 kg    Examination:  General exam: Appears calm and comfortable, chronically ill in appearance Respiratory system: Clear to auscultation. Respiratory effort normal.  On room air Cardiovascular system: S1 & S2 heard, RRR. No JVD, murmurs, rubs, gallops or clicks. No pedal edema. Gastrointestinal system: Abdomen is nondistended, soft and nontender. No organomegaly or masses felt. Normal bowel sounds heard. Central nervous system: Alert, oriented to place (hospital), time (2023), but not situation.  No focal neurological deficits. Extremities: Symmetric 5 x 5 power. Skin: No rashes, lesions or  ulcers Psychiatry: Judgement and insight appear poor. Mood & affect appropriate.     Data Reviewed: I have personally reviewed following labs and imaging studies  CBC: Recent Labs  Lab 03/20/21 1421 03/22/21 1440 03/23/21 0351 03/24/21 0355  WBC 5.7 10.5 6.8 9.5  HGB 11.3* 12.3* 8.6* 10.3*  HCT 36.6* 41.8 28.7* 33.7*  MCV 82.1 85.3 83.9 81.8  PLT 365 418* 289 341   Basic Metabolic Panel: Recent Labs  Lab 03/20/21 1421 03/22/21 1440 03/23/21 0351 03/24/21 0355  NA 137 140 140 136  K 3.6 4.1 3.8 3.7  CL 103 104 108 102  CO2 22 16* 19* 27  GLUCOSE 160* 70 76 120*  BUN 11 27* 23 17  CREATININE 1.22 1.54* 1.08 0.99  CALCIUM 8.9 9.1 8.5* 8.5*   GFR: Estimated Creatinine Clearance: 83 mL/min (by C-G formula based on SCr of 0.99 mg/dL). Liver Function Tests: Recent Labs  Lab 03/20/21 1421 03/22/21 1440  AST 23 39  ALT 16 19  ALKPHOS 91 89  BILITOT 0.7 1.2  PROT 7.1 7.9  ALBUMIN 3.6 4.1   No results for input(s): LIPASE, AMYLASE in the last 168 hours. No results for input(s): AMMONIA in the last 168 hours. Coagulation Profile: No results for  input(s): INR, PROTIME in the last 168 hours. Cardiac Enzymes: No results for input(s): CKTOTAL, CKMB, CKMBINDEX, TROPONINI in the last 168 hours. BNP (last 3 results) No results for input(s): PROBNP in the last 8760 hours. HbA1C: No results for input(s): HGBA1C in the last 72 hours.  CBG: Recent Labs  Lab 03/22/21 1641 03/23/21 1719  GLUCAP 71 96   Lipid Profile: No results for input(s): CHOL, HDL, LDLCALC, TRIG, CHOLHDL, LDLDIRECT in the last 72 hours. Thyroid Function Tests: No results for input(s): TSH, T4TOTAL, FREET4, T3FREE, THYROIDAB in the last 72 hours. Anemia Panel: No results for input(s): VITAMINB12, FOLATE, FERRITIN, TIBC, IRON, RETICCTPCT in the last 72 hours. Sepsis Labs: Recent Labs  Lab 03/23/21 0351  LATICACIDVEN 1.1    Recent Results (from the past 240 hour(s))  Resp Panel by RT-PCR (Flu  A&B, Covid) Nasopharyngeal Swab     Status: None   Collection Time: 03/22/21  7:28 PM   Specimen: Nasopharyngeal Swab; Nasopharyngeal(NP) swabs in vial transport medium  Result Value Ref Range Status   SARS Coronavirus 2 by RT PCR NEGATIVE NEGATIVE Final    Comment: (NOTE) SARS-CoV-2 target nucleic acids are NOT DETECTED.  The SARS-CoV-2 RNA is generally detectable in upper respiratory specimens during the acute phase of infection. The lowest concentration of SARS-CoV-2 viral copies this assay can detect is 138 copies/mL. A negative result does not preclude SARS-Cov-2 infection and should not be used as the sole basis for treatment or other patient management decisions. A negative result may occur with  improper specimen collection/handling, submission of specimen other than nasopharyngeal swab, presence of viral mutation(s) within the areas targeted by this assay, and inadequate number of viral copies(<138 copies/mL). A negative result must be combined with clinical observations, patient history, and epidemiological information. The expected result is Negative.  Fact Sheet for Patients:  EntrepreneurPulse.com.au  Fact Sheet for Healthcare Providers:  IncredibleEmployment.be  This test is no t yet approved or cleared by the [place] FDA and  has been authorized for detection and/or diagnosis of SARS-CoV-2 by FDA under an Emergency Use Authorization (EUA). This EUA will remain  in effect (meaning this test can be used) for the duration of the COVID-19 declaration under Section 564(b)(1) of the Act, 21 U.S.C.section 360bbb-3(b)(1), unless the authorization is terminated  or revoked sooner.       Influenza A by PCR NEGATIVE NEGATIVE Final   Influenza B by PCR NEGATIVE NEGATIVE Final    Comment: (NOTE) The Xpert Xpress SARS-CoV-2/FLU/RSV plus assay is intended as an aid in the diagnosis of influenza from Nasopharyngeal swab specimens  and should not be used as a sole basis for treatment. Nasal washings and aspirates are unacceptable for Xpert Xpress SARS-CoV-2/FLU/RSV testing.  Fact Sheet for Patients: EntrepreneurPulse.com.au  Fact Sheet for Healthcare Providers: IncredibleEmployment.be  This test is not yet approved or cleared by the [place] FDA and has been authorized for detection and/or diagnosis of SARS-CoV-2 by FDA under an Emergency Use Authorization (EUA). This EUA will remain in effect (meaning this test can be used) for the duration of the COVID-19 declaration under Section 564(b)(1) of the Act, 21 U.S.C. section 360bbb-3(b)(1), unless the authorization is terminated or revoked.  Performed at [facility], [place] [address], [place], [place] [address]          Radiology Studies: No results found.      Scheduled Meds:  amLODipine  10 mg Oral Daily   enoxaparin (LOVENOX) injection  40 mg Subcutaneous Q24H  levETIRAcetam  500 mg Oral BID   Continuous Infusions:   LOS: 0 days    Time spent: 36 minutes spent on chart review, discussion with nursing staff, consultants, updating family and interview/physical exam; more than 50% of that time was spent in counseling and/or coordination of care.    Brylei Pedley J [place] DO [facility] Available via Epic secure chat 7am-7pm After these hours, please refer to coverage provider listed on [email or web address] 03/26/2021, 11:07 AM

## 2021-03-26 NOTE — NC FL2 (Signed)
[place] LEVEL OF CARE SCREENING TOOL     IDENTIFICATION  Patient Name: Thomas Kline Birthdate: [birth year] Sex: male Admission Date (Current Location): 03/22/2021  [facility] and [place] Number:  Herbalist and Address:  [facility],  [place], [place]      Provider Number: [id number]  Attending Physician Name and Address:  [place] Donnamarie Poag, DO  Relative Name and Phone Number:       Current Level of Care: Hospital Recommended Level of Care: [place] Prior Approval Number:    Date Approved/Denied:   PASRR Number: [id number]  Discharge Plan: SNF    Current Diagnoses: Patient Active Problem List   Diagnosis Date Noted   Recurrent seizures ([place]) 03/26/2021   AKI (acute kidney injury) ([place]) 50/35/4656   Metabolic acidosis 81/27/5170   Type 2 diabetes mellitus ([place]) 03/23/2021   Anemia associated with acute blood loss 08/04/2019   Seizure ([place]) 08/02/2019   Polyp of nasal cavity 08/02/2019   Hypertension 08/02/2019   Pancolonic diverticulosis 11/16/2013   Abdominal bloating 11/15/2013   GERD (gastroesophageal reflux disease) 03/22/2012   Lightheadedness 12/09/2011   Multiple nasal polyps 12/09/2011   Hypertension 12/09/2011   Acute blood loss anemia 05/03/2011   Acute lower GI bleeding 05/03/2011   Seizure ([place]) 05/03/2011    Orientation RESPIRATION BLADDER Height & Weight     Self, Time, Situation, Place  Normal Continent Weight: 101 kg Height:  6' (182.9 cm)  BEHAVIORAL SYMPTOMS/MOOD NEUROLOGICAL BOWEL NUTRITION STATUS      Continent Diet (regular)  AMBULATORY STATUS COMMUNICATION OF NEEDS Skin   Extensive Assist Verbally Normal                       Personal Care Assistance Level of Assistance  Bathing, Feeding, Dressing Bathing Assistance: Limited assistance Feeding assistance: Limited assistance Dressing Assistance: Limited assistance     Functional Limitations Info  Sight,  Hearing, Speech Sight Info: Adequate Hearing Info: Adequate Speech Info: Adequate    SPECIAL CARE FACTORS FREQUENCY  PT (By licensed PT), OT (By licensed OT)     PT Frequency: 5 x weekly OT Frequency: 5 x weekly            Contractures Contractures Info: Not present    Additional Factors Info  Code Status Code Status Info: full             Current Medications (03/26/2021):  This is the current hospital active medication list Current Facility-Administered Medications  Medication Dose Route Frequency Provider Last Rate Last Admin   acetaminophen (TYLENOL) tablet 650 mg  650 mg Oral Q6H PRN Shela Leff, MD       Or   acetaminophen (TYLENOL) suppository 650 mg  650 mg Rectal Q6H PRN Shela Leff, MD       amLODipine (NORVASC) tablet 10 mg  10 mg Oral Daily [place] Eric J, DO   10 mg at 03/26/21 0857   enoxaparin (LOVENOX) injection 40 mg  40 mg Subcutaneous Q24H Shela Leff, MD   40 mg at 03/26/21 0857   hydrALAZINE (APRESOLINE) tablet 25 mg  25 mg Oral Q8H PRN [place] Eric J, DO       levETIRAcetam (KEPPRA) tablet 500 mg  500 mg Oral BID Shela Leff, MD   500 mg at 03/26/21 0174     Discharge Medications: Please see discharge summary for a list of discharge medications.  Relevant Imaging Results:  Relevant Lab Results:  Additional Information [phone] [place]

## 2021-03-27 LAB — RESP PANEL BY RT-PCR (FLU A&B, COVID) ARPGX2
Influenza A by PCR: NEGATIVE
Influenza B by PCR: NEGATIVE
SARS Coronavirus 2 by RT PCR: NEGATIVE

## 2021-03-27 MED ORDER — AMLODIPINE BESYLATE 10 MG PO TABS: 10.0000 mg | ORAL_TABLET | Freq: Every day | ORAL | Status: AC

## 2021-03-27 NOTE — Progress Notes (Signed)
Patient has been explained discharge plan and instructions. Patient has no further questions at this time.Patient AVS instructions have been printed and placed in discharge packet. IV of the right hand and left arm have been removed. Both sites are clean, dry, and intact.  Layla Maw, RN

## 2021-03-27 NOTE — TOC Transition Note (Signed)
Transition of Care [facility]) - CM/SW Discharge Note   Patient Details  Name: Jathniel Smeltzer MRN: [id number] Date of Birth: [birth year]  [facility]) CM/SW Contact:  Leeroy Cha, RN Phone Number: 03/27/2021, 1:07 PM   Clinical Narrative:    Pt to be transitioned to [facility].  Ptar called for pickup at 3 pm.  Patient made aware of this.  Transfer packet completed and placed at the unit clerks desk.     Barriers to Discharge: Continued Medical Work up   Patient Goals and CMS Choice Patient states their goals for this hospitalization and ongoing recovery are:: not stated      Discharge Placement                       Discharge Plan and Services   Discharge Planning Services: CM Consult                                 Social Determinants of Health (SDOH) Interventions     Readmission Risk Interventions No flowsheet data found.

## 2021-03-27 NOTE — Discharge Summary (Addendum)
Physician Discharge Summary  Thomas Kline JKK:[id number] DOB: 1950-07-15 DOA: 03/22/2021  PCP: Gifford Shave, MD  Admit date: 03/22/2021 Discharge date: 03/27/2021  Admitted From: Home Disposition: [facility]  Recommendations for Outpatient Follow-up:  Follow up with PCP in 1-2 weeks Increase amlodipine to 10 mg p.o. daily Discontinue HCTZ for now Continue monitor blood pressure closely outpatient  Discharge Condition: Stable CODE STATUS: Full code Diet recommendation: Heart healthy/consistent carbohydrate diet  History of present illness:  Thomas Kline is a 71 year old male with past medical history significant for seizure disorder, essential hypertension, pancolonic diverticulosis, intestinal polyps, hemorrhoids, anemia, type 2 diabetes mellitus, GERD who presented to [facility] ED on 1/5 via EMS as he was found lying in a field by a truck stop.  He was noted to have multiple abrasions to the face and scalp and incontinent of stool and urine.  He also had a IVN hospital bracelet which was placed during his previous ED visit 2 days ago.  Patient was only oriented to self and remained confused on arrival to the ED which was felt likely be postictal confusion.  He states he lives on the streets and does not have a home.  Does not take any medications.  Reports he drinks 2 bottles of beer a week.  No other information could be obtained due to his current mental status.   In the ED, temperature 98.0 F, HR 92, RR 18, BP 138/81, SPO2 96% on room air.  Sodium 140, potassium 4.1, chloride 104, CO2 16, glucose 70, BUN 27, creatinine 1.54, AST 39, ALT 19, total bilirubin 1.2.  WBC 10.5, hemoglobin 13.3, platelets 14.  Lactic acid 1.1.  COVID-19 PCR negative.  Lactic acid 1.1.  Influenza A/B PCR negative.  Urinalysis unrevealing.  EtOH level less than 10.  UDS negative.  CT head without contrast with no acute intracranial normality with signs of chronic microvascular ischemic change and atrophy.   Chest x-ray with slightly prominent cardiomediastinal silhouette, no active disease.  Hospital course:  Acute metabolic encephalopathy, POA Breakthrough seizure Patient presenting to ED via EMS after being found down in the field.  He was confused, likely postictal state as he is noncompliant with medical therapy/antiepileptics.  Recently seen in the ED 2 days prior and discharged home with Melburn Popper.  Evaluation the ED with negative head CT, normal EtOH level and no signs of active infectious process.  Blood sugar within normal limits, UDS negative..  Neurology was consulted and recommended loading dose IV Keppra 1 g followed by resumption of home Keppra 500 twice daily.  EEG with no seizure or epileptiform discharges.  Continue Keppra 500 mg p.o. twice daily.   Acute renal failure, POA: Resolved Creatinine on admission elevated 1.54, likely prerenal azotemia in the setting of dehydration and postictal state from seizure as above.  Baseline creatinine 0.9-1.2.  Started on IV fluid hydration with resolution of renal failure, now discontinued.  Creatinine improved to 0.99 at time of discharge.   High anion gap metabolic acidosis: Resolved Anion gap elevated at 20 admission.  Lactic acid 1.1.  Likely secondary to seizure as above.  Anion gap now down to 13, resolved.   Essential hypertension Continue amlodipine 10 mg p.o. daily.   Type 2 diabetes mellitus Hemoglobin A1c 6.0, well controlled.  Diet controlled at home.   Weakness/debility/deconditioning: Discharging to SNF for further rehabilitation.  Social: Patient initially stated that he was homeless but was confused in the ED following a postictal state.  It was verified that patient  is a resident of [facility].  He is not homeless and this has been verified by social work.  Discharge Diagnoses:  Active Problems:   Hypertension   Type 2 diabetes mellitus ([place])   Recurrent seizures [facility])    Discharge  Instructions  Discharge Instructions     Call MD for:  difficulty breathing, headache or visual disturbances   Complete by: As directed    Call MD for:  extreme fatigue   Complete by: As directed    Call MD for:  persistant dizziness or light-headedness   Complete by: As directed    Call MD for:  persistant nausea and vomiting   Complete by: As directed    Call MD for:  severe uncontrolled pain   Complete by: As directed    Call MD for:  temperature >100.4   Complete by: As directed    Diet - low sodium heart healthy   Complete by: As directed    Increase activity slowly   Complete by: As directed       Allergies as of 03/27/2021   No Known Allergies      Medication List     STOP taking these medications    hydrochlorothiazide 25 MG tablet Commonly known as: HYDRODIURIL       TAKE these medications    amLODipine 10 MG tablet Commonly known as: NORVASC Take 1 tablet (10 mg total) by mouth daily. Start taking on: March 28, 2021 What changed:  medication strength how much to take   ferrous sulfate 325 (65 FE) MG tablet Take 1 tablet (325 mg total) by mouth daily with breakfast.   fluticasone 50 MCG/ACT nasal spray Commonly known as: FLONASE Place 2 sprays into both nostrils daily as needed for allergies or rhinitis.   levETIRAcetam 500 MG tablet Commonly known as: Keppra Take 1 tablet (500 mg total) by mouth 2 (two) times daily.   loratadine 10 MG tablet Commonly known as: Claritin Take 1 tablet (10 mg total) by mouth daily.   montelukast 10 MG tablet Commonly known as: SINGULAIR Take 1 tablet (10 mg total) by mouth at bedtime.   pantoprazole 20 MG tablet Commonly known as: PROTONIX Take 1 tablet (20 mg total) by mouth 2 (two) times daily.   sodium chloride 0.65 % Soln nasal spray Commonly known as: OCEAN Place 1 spray into both nostrils as needed for congestion.        Follow-up Information     Gifford Shave, MD. Schedule an  appointment as soon as possible for a visit in 1 week(s).   Specialty: Family Medicine Contact information: [address] [address] [phone]                No Known Allergies  Consultations: Neurology - Case discussed by ED physician   Procedures/Studies: CT Head Wo Contrast  Result Date: 03/22/2021 CLINICAL DATA:  Head trauma in a 71 year old male. EXAM: CT HEAD WITHOUT CONTRAST TECHNIQUE: Contiguous axial images were obtained from the base of the skull through the vertex without intravenous contrast. Radiation dose reduction: This exam was performed according to the departmental dose-optimization program which includes automated exposure control, adjustment of the mA and/or kV according to patient size and/or use of iterative reconstruction technique. COMPARISON:  Comparison is made with MRI of the brain of June 04, 2019. Also with head CT of March 20, 2021. FINDINGS: Brain: No evidence of acute infarction, hemorrhage, hydrocephalus, extra-axial collection or mass lesion/mass  effect. Signs of atrophy and chronic microvascular ischemic change are similar to prior imaging. Vascular: No hyperdense vessel or unexpected calcification. Skull: Normal. Negative for fracture or focal lesion. Sinuses/Orbits: Diffuse paranasal sinus disease improved compared to more remote imaging involving all visualized paranasal sinuses to some extent, as far back as 2012 there was complete opacification of the paranasal sinuses. Some improvement perhaps since recent comparison imaging from March 20, 2021 with continued polypoid appearance of many of these areas. Other: None IMPRESSION: No acute intracranial abnormality with signs of chronic microvascular ischemic change and atrophy as before. Paranasal sinus disease perhaps slightly improved compared to recent imaging and markedly improved compared to prior studies. Nodular appearance particularly in the sphenoid sinuses may reflect  inspissated secretions, sequela of fungal infection or even neoplasm. Direct visualization if not yet performed on follow-up may be helpful as stated previously. Electronically Signed   By: Zetta Bills M.D.   On: 03/22/2021 18:09   CT Head Wo Contrast  Result Date: 03/20/2021 CLINICAL DATA:  Head trauma, minor (Age >= 65y); Neck trauma (Age >= 65y); Facial trauma, blunt. EXAM: CT HEAD WITHOUT CONTRAST CT MAXILLOFACIAL WITHOUT CONTRAST CT CERVICAL SPINE WITHOUT CONTRAST TECHNIQUE: Multidetector CT imaging of the head, cervical spine, and maxillofacial structures were performed using the standard protocol without intravenous contrast. Multiplanar CT image reconstructions of the cervical spine and maxillofacial structures were also generated. COMPARISON:  CT head 06/03/2019 FINDINGS: CT HEAD FINDINGS BRAIN: BRAIN Patchy and confluent areas of decreased attenuation are noted throughout the deep and periventricular white matter of the cerebral hemispheres bilaterally, compatible with chronic microvascular ischemic disease. No evidence of large-territorial acute infarction. No parenchymal hemorrhage. No mass lesion. No extra-axial collection. No mass effect or midline shift. No hydrocephalus. Basilar cisterns are patent. Vascular: No hyperdense vessel. Atherosclerotic calcifications are present within the cavernous internal carotid arteries. Skull: No acute fracture or focal lesion. Other: None. CT MAXILLOFACIAL FINDINGS Osseous: Likely old healed bilateral nasal bone fractures. No definite acute displaced fracture or mandibular dislocation. No destructive process. Sinuses/Orbits: Almost complete opacification of the paranasal sinuses. Polypoid lesions within the nasal cavities (4:54). Paranasal sinuses and mastoid air cells are clear. The orbits are unremarkable. Soft tissues: No large hematoma formation. CT CERVICAL SPINE FINDINGS Alignment: Normal. Skull base and vertebrae: Multilevel severe degenerative changes  spine with fusion of the C4 through C6 levels. No acute fracture. No aggressive appearing focal osseous lesion or focal pathologic process. Soft tissues and spinal canal: No prevertebral fluid or swelling. No visible canal hematoma. Upper chest: Trace biapical paraseptal emphysematous changes. Other: None. IMPRESSION: 1. No acute intracranial abnormality. 2. No acute displaced facial fracture. 3. No acute displaced fracture or traumatic listhesis of the cervical spine. 4. Pansinusitis with polypoid lesions within the nasal cavities. Recommend direct visualization. Differential diagnosis includes nasal polyps, amyloidosis, neoplasm, inspissated secretions, fungal infection. Electronically Signed   By: Iven Finn M.D.   On: 03/20/2021 15:12   CT Cervical Spine Wo Contrast  Result Date: 03/20/2021 CLINICAL DATA:  Head trauma, minor (Age >= 65y); Neck trauma (Age >= 65y); Facial trauma, blunt. EXAM: CT HEAD WITHOUT CONTRAST CT MAXILLOFACIAL WITHOUT CONTRAST CT CERVICAL SPINE WITHOUT CONTRAST TECHNIQUE: Multidetector CT imaging of the head, cervical spine, and maxillofacial structures were performed using the standard protocol without intravenous contrast. Multiplanar CT image reconstructions of the cervical spine and maxillofacial structures were also generated. COMPARISON:  CT head 06/03/2019 FINDINGS: CT HEAD FINDINGS BRAIN: BRAIN Patchy and confluent areas of decreased attenuation are noted  throughout the deep and periventricular white matter of the cerebral hemispheres bilaterally, compatible with chronic microvascular ischemic disease. No evidence of large-territorial acute infarction. No parenchymal hemorrhage. No mass lesion. No extra-axial collection. No mass effect or midline shift. No hydrocephalus. Basilar cisterns are patent. Vascular: No hyperdense vessel. Atherosclerotic calcifications are present within the cavernous internal carotid arteries. Skull: No acute fracture or focal lesion. Other: None.  CT MAXILLOFACIAL FINDINGS Osseous: Likely old healed bilateral nasal bone fractures. No definite acute displaced fracture or mandibular dislocation. No destructive process. Sinuses/Orbits: Almost complete opacification of the paranasal sinuses. Polypoid lesions within the nasal cavities (4:54). Paranasal sinuses and mastoid air cells are clear. The orbits are unremarkable. Soft tissues: No large hematoma formation. CT CERVICAL SPINE FINDINGS Alignment: Normal. Skull base and vertebrae: Multilevel severe degenerative changes spine with fusion of the C4 through C6 levels. No acute fracture. No aggressive appearing focal osseous lesion or focal pathologic process. Soft tissues and spinal canal: No prevertebral fluid or swelling. No visible canal hematoma. Upper chest: Trace biapical paraseptal emphysematous changes. Other: None. IMPRESSION: 1. No acute intracranial abnormality. 2. No acute displaced facial fracture. 3. No acute displaced fracture or traumatic listhesis of the cervical spine. 4. Pansinusitis with polypoid lesions within the nasal cavities. Recommend direct visualization. Differential diagnosis includes nasal polyps, amyloidosis, neoplasm, inspissated secretions, fungal infection. Electronically Signed   By: Iven Finn M.D.   On: 03/20/2021 15:12   DG Chest Port 1 View  Result Date: 03/22/2021 CLINICAL DATA:  weakness EXAM: PORTABLE CHEST 1 VIEW COMPARISON:  Chest x-ray 05/03/2011 FINDINGS: Slightly prominent cardiomediastinal silhouette likely due to AP portable technique. Otherwise the heart and mediastinal contours are within normal limits. No focal consolidation. No pulmonary edema. No pleural effusion. No pneumothorax. No acute osseous abnormality. IMPRESSION: Slightly prominent cardiomediastinal silhouette likely due to AP portable technique. No active disease. Electronically Signed   By: Iven Finn M.D.   On: 03/22/2021 17:15   EEG adult  Result Date: 03/23/2021 Lora Havens,  MD     03/23/2021  3:47 PM Patient Name: Thomas Kline MRN: [id number] Epilepsy Attending: Lora Havens Referring Physician/Provider: Dr Derrick Ravel Date: 03/23/2021 Duration: 23.30 mins Patient history: 71yo m with breakthrough seizure. EEG to evaluate for seizure Level of alertness: Awake, asleep AEDs during EEG study: LEV Technical aspects: This EEG study was done with scalp electrodes positioned according to the 10-20 International system of electrode placement. Electrical activity was acquired at a sampling rate of 500Hz  and reviewed with a high frequency filter of 70Hz  and a low frequency filter of 1Hz . EEG data were recorded continuously and digitally stored. Description: The posterior dominant rhythm consists of 9 Hz activity of moderate voltage (25-35 uV) seen predominantly in posterior head regions, symmetric and reactive to eye opening and eye closing. Sleep was characterized by vertex waves, sleep spindles (12 to 14 Hz), maximal frontocentral region. Hyperventilation and photic stimulation were not performed.   IMPRESSION: This study is within normal limits. No seizures or epileptiform discharges were seen throughout the recording. Lora Havens   CT Maxillofacial Wo Contrast  Result Date: 03/20/2021 CLINICAL DATA:  Head trauma, minor (Age >= 65y); Neck trauma (Age >= 65y); Facial trauma, blunt. EXAM: CT HEAD WITHOUT CONTRAST CT MAXILLOFACIAL WITHOUT CONTRAST CT CERVICAL SPINE WITHOUT CONTRAST TECHNIQUE: Multidetector CT imaging of the head, cervical spine, and maxillofacial structures were performed using the standard protocol without intravenous contrast. Multiplanar CT image reconstructions of the cervical spine and maxillofacial structures were also  generated. COMPARISON:  CT head 06/03/2019 FINDINGS: CT HEAD FINDINGS BRAIN: BRAIN Patchy and confluent areas of decreased attenuation are noted throughout the deep and periventricular white matter of the cerebral hemispheres bilaterally,  compatible with chronic microvascular ischemic disease. No evidence of large-territorial acute infarction. No parenchymal hemorrhage. No mass lesion. No extra-axial collection. No mass effect or midline shift. No hydrocephalus. Basilar cisterns are patent. Vascular: No hyperdense vessel. Atherosclerotic calcifications are present within the cavernous internal carotid arteries. Skull: No acute fracture or focal lesion. Other: None. CT MAXILLOFACIAL FINDINGS Osseous: Likely old healed bilateral nasal bone fractures. No definite acute displaced fracture or mandibular dislocation. No destructive process. Sinuses/Orbits: Almost complete opacification of the paranasal sinuses. Polypoid lesions within the nasal cavities (4:54). Paranasal sinuses and mastoid air cells are clear. The orbits are unremarkable. Soft tissues: No large hematoma formation. CT CERVICAL SPINE FINDINGS Alignment: Normal. Skull base and vertebrae: Multilevel severe degenerative changes spine with fusion of the C4 through C6 levels. No acute fracture. No aggressive appearing focal osseous lesion or focal pathologic process. Soft tissues and spinal canal: No prevertebral fluid or swelling. No visible canal hematoma. Upper chest: Trace biapical paraseptal emphysematous changes. Other: None. IMPRESSION: 1. No acute intracranial abnormality. 2. No acute displaced facial fracture. 3. No acute displaced fracture or traumatic listhesis of the cervical spine. 4. Pansinusitis with polypoid lesions within the nasal cavities. Recommend direct visualization. Differential diagnosis includes nasal polyps, amyloidosis, neoplasm, inspissated secretions, fungal infection. Electronically Signed   By: Iven Finn M.D.   On: 03/20/2021 15:12     Subjective: Patient seen examined at bedside, resting comfortably.  No complaints this morning.  Discharging to SNF today.  Denies headache, no chest pain, no palpitations, no shortness of breath, no abdominal pain, no  weakness, no fatigue, no paresthesias.  No acute events overnight per nurse staff.  Discharge Exam: Vitals:   03/26/21 2032 03/27/21 0441  BP: (!) 151/73 (!) 142/89  Pulse: 77 93  Resp: 18 15  Temp: 97.8 F (36.6 C) 98.4 F (36.9 C)  SpO2: 100% 99%   Vitals:   03/26/21 0857 03/26/21 1156 03/26/21 2032 03/27/21 0441  BP: 120/72 129/77 (!) 151/73 (!) 142/89  Pulse:  81 77 93  Resp:  18 18 15   Temp:  98.5 F (36.9 C) 97.8 F (36.6 C) 98.4 F (36.9 C)  TempSrc:  Oral Oral Oral  SpO2:  100% 100% 99%  Weight:      Height:        General: Pt is alert, awake, not in acute distress, chronically ill in appearance Cardiovascular: RRR, S1/S2 +, no rubs, no gallops Respiratory: CTA bilaterally, no wheezing, no rhonchi, on room air Abdominal: Soft, NT, ND, bowel sounds + Extremities: no edema, no cyanosis    The results of significant diagnostics from this hospitalization (including imaging, microbiology, ancillary and laboratory) are listed below for reference.     Microbiology: Recent Results (from the past 240 hour(s))  Resp Panel by RT-PCR (Flu A&B, Covid) Nasopharyngeal Swab     Status: None   Collection Time: 03/22/21  7:28 PM   Specimen: Nasopharyngeal Swab; Nasopharyngeal(NP) swabs in vial transport medium  Result Value Ref Range Status   SARS Coronavirus 2 by RT PCR NEGATIVE NEGATIVE Final    Comment: (NOTE) SARS-CoV-2 target nucleic acids are NOT DETECTED.  The SARS-CoV-2 RNA is generally detectable in upper respiratory specimens during the acute phase of infection. The lowest concentration of SARS-CoV-2 viral copies this assay can detect is  138 copies/mL. A negative result does not preclude SARS-Cov-2 infection and should not be used as the sole basis for treatment or other patient management decisions. A negative result may occur with  improper specimen collection/handling, submission of specimen other than nasopharyngeal swab, presence of viral mutation(s)  within the areas targeted by this assay, and inadequate number of viral copies(<138 copies/mL). A negative result must be combined with clinical observations, patient history, and epidemiological information. The expected result is Negative.  Fact Sheet for Patients:  EntrepreneurPulse.com.au  Fact Sheet for Healthcare Providers:  IncredibleEmployment.be  This test is no t yet approved or cleared by the [place] FDA and  has been authorized for detection and/or diagnosis of SARS-CoV-2 by FDA under an Emergency Use Authorization (EUA). This EUA will remain  in effect (meaning this test can be used) for the duration of the COVID-19 declaration under Section 564(b)(1) of the Act, 21 U.S.C.section 360bbb-3(b)(1), unless the authorization is terminated  or revoked sooner.       Influenza A by PCR NEGATIVE NEGATIVE Final   Influenza B by PCR NEGATIVE NEGATIVE Final    Comment: (NOTE) The Xpert Xpress SARS-CoV-2/FLU/RSV plus assay is intended as an aid in the diagnosis of influenza from Nasopharyngeal swab specimens and should not be used as a sole basis for treatment. Nasal washings and aspirates are unacceptable for Xpert Xpress SARS-CoV-2/FLU/RSV testing.  Fact Sheet for Patients: EntrepreneurPulse.com.au  Fact Sheet for Healthcare Providers: IncredibleEmployment.be  This test is not yet approved or cleared by the [place] FDA and has been authorized for detection and/or diagnosis of SARS-CoV-2 by FDA under an Emergency Use Authorization (EUA). This EUA will remain in effect (meaning this test can be used) for the duration of the COVID-19 declaration under Section 564(b)(1) of the Act, 21 U.S.C. section 360bbb-3(b)(1), unless the authorization is terminated or revoked.  Performed at [facility], [place] [address] [place], [place] [address]      Labs: BNP (last 3  results) No results for input(s): BNP in the last 8760 hours. Basic Metabolic Panel: Recent Labs  Lab 03/20/21 1421 03/22/21 1440 03/23/21 0351 03/24/21 0355  NA 137 140 140 136  K 3.6 4.1 3.8 3.7  CL 103 104 108 102  CO2 22 16* 19* 27  GLUCOSE 160* 70 76 120*  BUN 11 27* 23 17  CREATININE 1.22 1.54* 1.08 0.99  CALCIUM 8.9 9.1 8.5* 8.5*   Liver Function Tests: Recent Labs  Lab 03/20/21 1421 03/22/21 1440  AST 23 39  ALT 16 19  ALKPHOS 91 89  BILITOT 0.7 1.2  PROT 7.1 7.9  ALBUMIN 3.6 4.1   No results for input(s): LIPASE, AMYLASE in the last 168 hours. No results for input(s): AMMONIA in the last 168 hours. CBC: Recent Labs  Lab 03/20/21 1421 03/22/21 1440 03/23/21 0351 03/24/21 0355  WBC 5.7 10.5 6.8 9.5  HGB 11.3* 12.3* 8.6* 10.3*  HCT 36.6* 41.8 28.7* 33.7*  MCV 82.1 85.3 83.9 81.8  PLT 365 418* 289 360   Cardiac Enzymes: No results for input(s): CKTOTAL, CKMB, CKMBINDEX, TROPONINI in the last 168 hours. BNP: Invalid input(s): POCBNP CBG: Recent Labs  Lab 03/22/21 1641 03/23/21 1719  GLUCAP 71 96   D-Dimer No results for input(s): DDIMER in the last 72 hours. Hgb A1c No results for input(s): HGBA1C in the last 72 hours. Lipid Profile No results for input(s): CHOL, HDL, LDLCALC, TRIG, CHOLHDL, LDLDIRECT in the last 72 hours. Thyroid function studies No results  for input(s): TSH, T4TOTAL, T3FREE, THYROIDAB in the last 72 hours.  Invalid input(s): FREET3 Anemia work up No results for input(s): VITAMINB12, FOLATE, FERRITIN, TIBC, IRON, RETICCTPCT in the last 72 hours. Urinalysis    Component Value Date/Time   COLORURINE YELLOW 03/23/2021 [place] 03/23/2021 0355   LABSPEC 1.023 03/23/2021 0355   PHURINE 5.0 03/23/2021 0355   GLUCOSEU NEGATIVE 03/23/2021 0355   HGBUR NEGATIVE 03/23/2021 0355   BILIRUBINUR NEGATIVE 03/23/2021 0355   KETONESUR 80 (A) 03/23/2021 0355   PROTEINUR NEGATIVE 03/23/2021 0355   UROBILINOGEN 0.2  05/04/2011 1316   NITRITE NEGATIVE 03/23/2021 0355   LEUKOCYTESUR NEGATIVE 03/23/2021 0355   Sepsis Labs Invalid input(s): PROCALCITONIN,  WBC,  LACTICIDVEN Microbiology Recent Results (from the past 240 hour(s))  Resp Panel by RT-PCR (Flu A&B, Covid) Nasopharyngeal Swab     Status: None   Collection Time: 03/22/21  7:28 PM   Specimen: Nasopharyngeal Swab; Nasopharyngeal(NP) swabs in vial transport medium  Result Value Ref Range Status   SARS Coronavirus 2 by RT PCR NEGATIVE NEGATIVE Final    Comment: (NOTE) SARS-CoV-2 target nucleic acids are NOT DETECTED.  The SARS-CoV-2 RNA is generally detectable in upper respiratory specimens during the acute phase of infection. The lowest concentration of SARS-CoV-2 viral copies this assay can detect is 138 copies/mL. A negative result does not preclude SARS-Cov-2 infection and should not be used as the sole basis for treatment or other patient management decisions. A negative result may occur with  improper specimen collection/handling, submission of specimen other than nasopharyngeal swab, presence of viral mutation(s) within the areas targeted by this assay, and inadequate number of viral copies(<138 copies/mL). A negative result must be combined with clinical observations, patient history, and epidemiological information. The expected result is Negative.  Fact Sheet for Patients:  EntrepreneurPulse.com.au  Fact Sheet for Healthcare Providers:  IncredibleEmployment.be  This test is no t yet approved or cleared by the [place] FDA and  has been authorized for detection and/or diagnosis of SARS-CoV-2 by FDA under an Emergency Use Authorization (EUA). This EUA will remain  in effect (meaning this test can be used) for the duration of the COVID-19 declaration under Section 564(b)(1) of the Act, 21 U.S.C.section 360bbb-3(b)(1), unless the authorization is terminated  or revoked sooner.        Influenza A by PCR NEGATIVE NEGATIVE Final   Influenza B by PCR NEGATIVE NEGATIVE Final    Comment: (NOTE) The Xpert Xpress SARS-CoV-2/FLU/RSV plus assay is intended as an aid in the diagnosis of influenza from Nasopharyngeal swab specimens and should not be used as a sole basis for treatment. Nasal washings and aspirates are unacceptable for Xpert Xpress SARS-CoV-2/FLU/RSV testing.  Fact Sheet for Patients: EntrepreneurPulse.com.au  Fact Sheet for Healthcare Providers: IncredibleEmployment.be  This test is not yet approved or cleared by the [place] FDA and has been authorized for detection and/or diagnosis of SARS-CoV-2 by FDA under an Emergency Use Authorization (EUA). This EUA will remain in effect (meaning this test can be used) for the duration of the COVID-19 declaration under Section 564(b)(1) of the Act, 21 U.S.C. section 360bbb-3(b)(1), unless the authorization is terminated or revoked.  Performed at [facility], [place] [address], [place], [place] [address]      Time coordinating discharge: Over 30 minutes  SIGNED:   Luismiguel Lamere J [place] DO  [facility] 03/27/2021, 11:04 AM

## 2021-03-27 NOTE — Progress Notes (Signed)
Occupational Therapy Treatment Patient Details Name: Thomas Kline MRN: [id number] DOB: March 10, 1951 Today's Date: 03/27/2021   History of present illness Thomas Kline is a 71 y.o. male presented on 03/22/21 after found lying on the ground-likely seizures. Pt appears to have been evaluated 2 days prior for possible seizure-like activity on 03/20/20 and discharged from ED.  CT head negative. PMH: seizure, HTN, diverticulosis, intestinal polyps, hemorrhoids, anemia, diabetes, GERD   OT comments  Patients session was limited by patients eagerness to transition to SNF at this time. Patient was noted to have improved standing balance with min guard for BUE challenges with shoes in place. Patient continues to have poor safety awareness during session with patient unable to carryover education on safety provided between tasks. Patient would continue to benefit from skilled OT services at this time while admitted and after d/c to address noted deficits in order to improve overall safety and independence in ADLs.     Recommendations for follow up therapy are one component of a multi-disciplinary discharge planning process, led by the attending physician.  Recommendations may be updated based on patient status, additional functional criteria and insurance authorization.    Follow Up Recommendations  Skilled nursing-short term rehab (<3 hours/day)    Assistance Recommended at Discharge Frequent or constant Supervision/Assistance  Patient can return home with the following  A little help with walking and/or transfers;A little help with bathing/dressing/bathroom;Direct supervision/assist for medications management;Direct supervision/assist for financial management;Help with stairs or ramp for entrance;Assist for transportation   Equipment Recommendations  Other (comment) (defer to next venue)    Recommendations for Other Services      Precautions / Restrictions Precautions Precautions: Fall Precaution  Comments: knees buckle with movement Restrictions Weight Bearing Restrictions: No       Mobility Bed Mobility                    Transfers                         Balance           Standing balance support: No upper extremity supported Standing balance-Leahy Scale: Fair Standing balance comment: in static standing.                           ADL either performed or assessed with clinical judgement   ADL Overall ADL's : Needs assistance/impaired                       Lower Body Dressing Details (indicate cue type and reason): patient doffed socks and donned bilateral shoes with education to retie them to reduce tripping hazard. patient was able to pull up disposable pants x3 during mobility and standing balance challanges to maintain coverage and proper placement.   Toilet Transfer Details (indicate cue type and reason): patient was able to transfer around room with min guard and continued staggering mobility pattern.                Extremity/Trunk Assessment              Vision       Perception     Praxis      Cognition Arousal/Alertness: Awake/alert Behavior During Therapy: WFL for tasks assessed/performed Overall Cognitive Status: No family/caregiver present to determine baseline cognitive functioning  General Comments: patient was noted to have improved communication on this date but continued poor safety awareness. patient was fixated on going to rehab at this time with limited focus during session with patient encountering same safety isseus multiple times with continued cues needed. unable to follow past two step commands. improvement from last session.          Exercises     Shoulder Instructions       General Comments      Pertinent Vitals/ Pain       Pain Assessment: No/denies pain  Home Living                                           Prior Functioning/Environment              Frequency  Min 2X/week        Progress Toward Goals  OT Goals(current goals can now be found in the care plan section)  Progress towards OT goals: Progressing toward goals     Plan Discharge plan needs to be updated    Co-evaluation                 AM-PAC OT "6 Clicks" Daily Activity     Outcome Measure   Help from another person eating meals?: None Help from another person taking care of personal grooming?: A Little Help from another person toileting, which includes using toliet, bedpan, or urinal?: A Little Help from another person bathing (including washing, rinsing, drying)?: A Little Help from another person to put on and taking off regular upper body clothing?: A Little Help from another person to put on and taking off regular lower body clothing?: A Little 6 Click Score: 19    End of Session Equipment Utilized During Treatment: Gait belt  OT Visit Diagnosis: Unsteadiness on feet (R26.81);Other abnormalities of gait and mobility (R26.89);History of falling (Z91.81)   Activity Tolerance Patient tolerated treatment well   Patient Left in chair;with nursing/sitter in room   Nurse Communication Other (comment) (patient declining to sit in bed and fixation on going to SNF)        Time: 2440-1027 OT Time Calculation (min): 9 min  Charges: OT General Charges $OT Visit: 1 Visit OT Treatments $Self Care/Home Management : 8-22 mins  Jackelyn Poling OTR/L, MS Acute Rehabilitation Department Office# [phone] Pager# [phone]   Marcellina Millin 03/27/2021, 3:05 PM

## 2021-04-30 IMAGING — CT CT MAXILLOFACIAL W/O CM
3 series · 14 of 47 positions shown, 16 images · non-contrast
Comparison: Head CT yesterday, brain MRI 05/25/2019. paranasal
sinus CT 02/09/2019.

CLINICAL DATA: 68-year-old male with sinonasal polyposis, surgical
planning.

EXAM:
CT MAXILLOFACIAL WITHOUT CONTRAST
TECHNIQUE: Multidetector CT images of the paranasal sinuses were obtained using
the standard protocol without intravenous contrast.

[Series 3: ax standard · axial · 0.59mm/px · z∈[+919,+1035]mm · 8 of 136 slices shown, 10 images]
[im 10/136  brain]
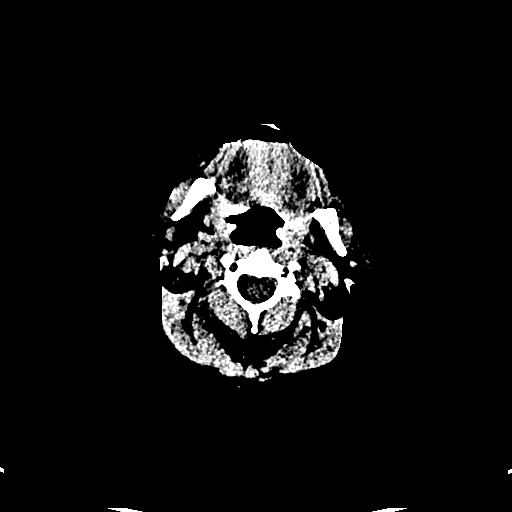
[im 10/136  bone]
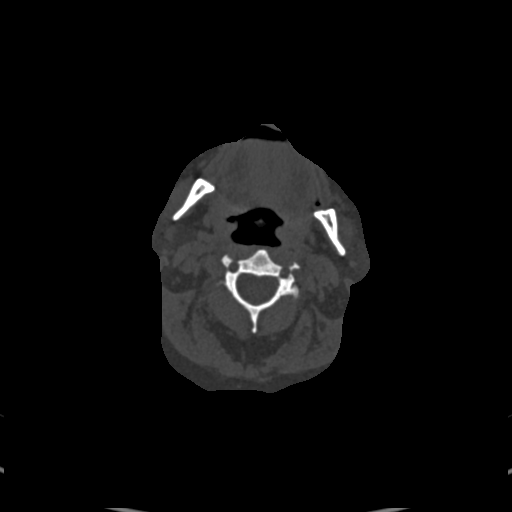
[im 28/136  bone]
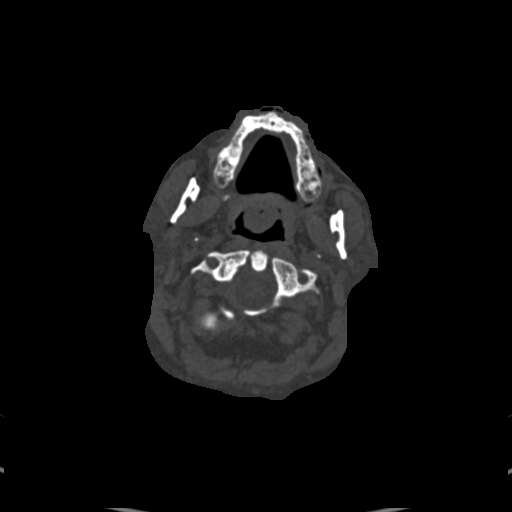
[im 42/136  bone]
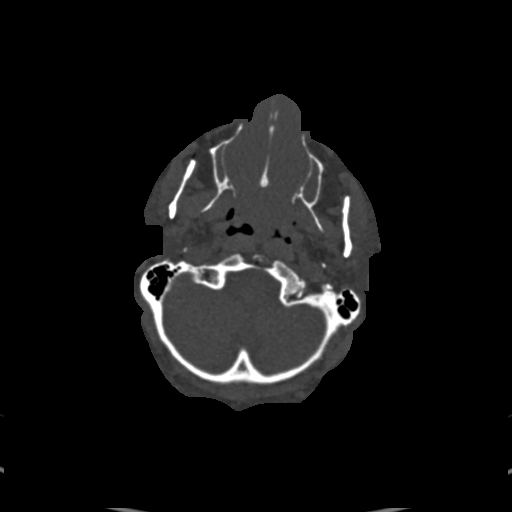
[im 61/136  bone]
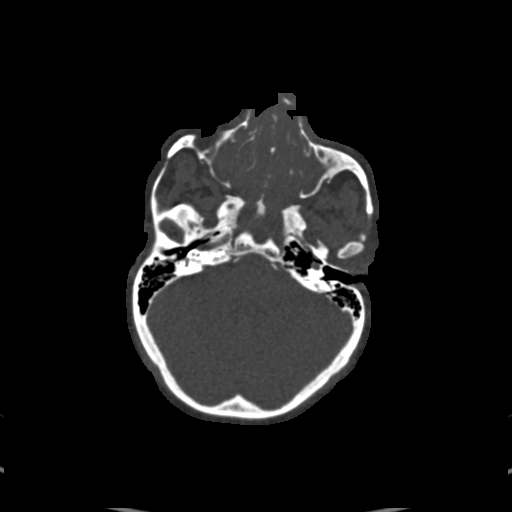
[im 75/136  brain]
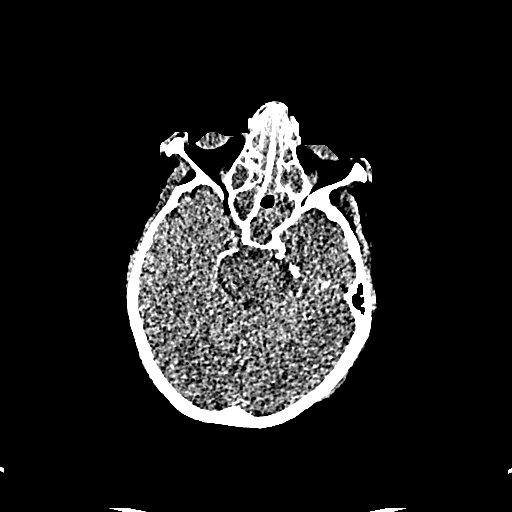
[im 75/136  bone]
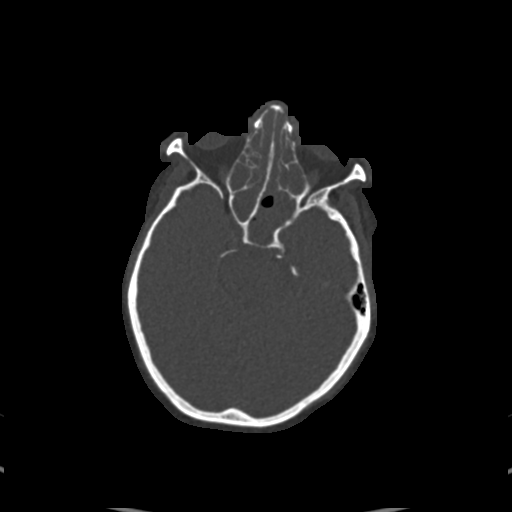
[im 94/136  bone]
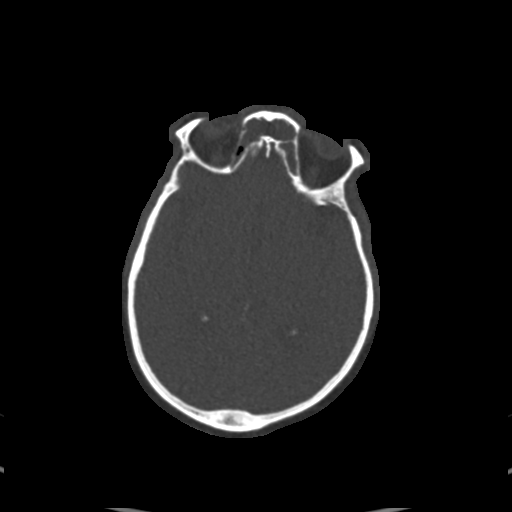
[im 108/136  bone]
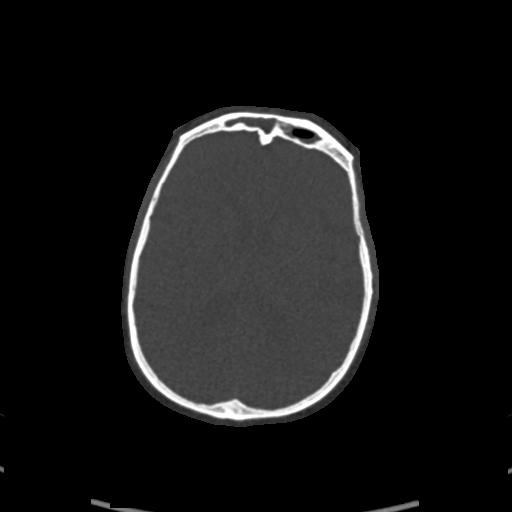
[im 126/136  bone]
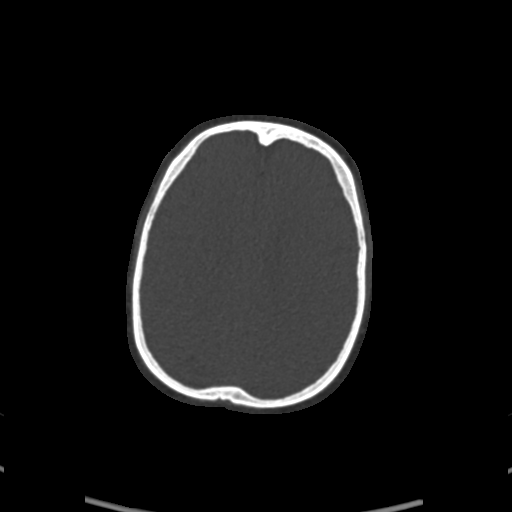

[Series 5: coronal sinus · coronal · 0.31mm/px · 3 of 107 slices shown]
[im 36/107  bone]
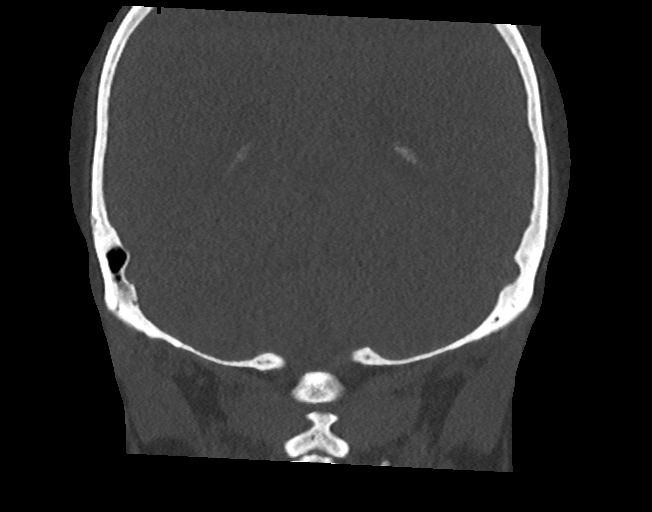
[im 48/107  bone]
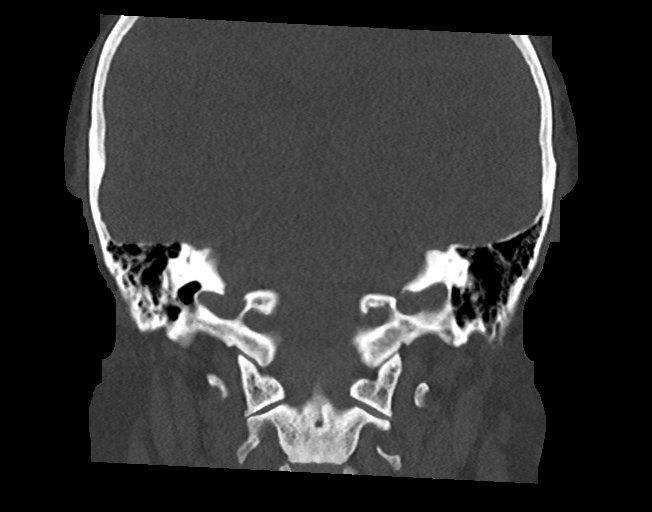
[im 59/107  bone]
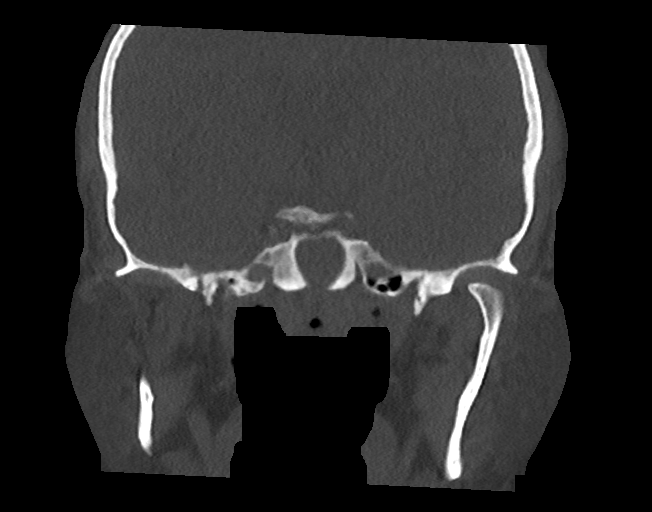

[Series 6: sagittal sinus · sagittal · 0.31mm/px · 3 of 93 slices shown]
[im 31/93  bone]
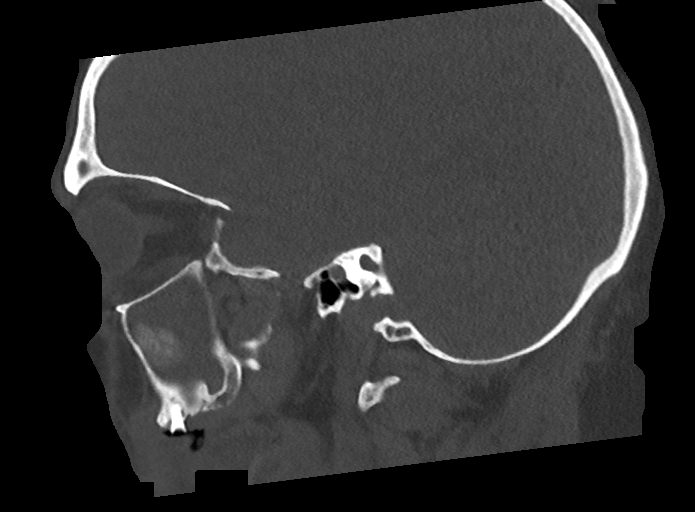
[im 47/93  bone]
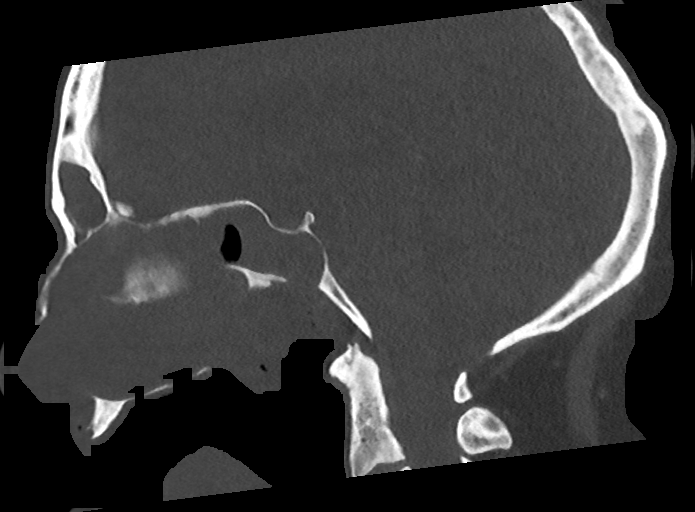
[im 62/93  bone]
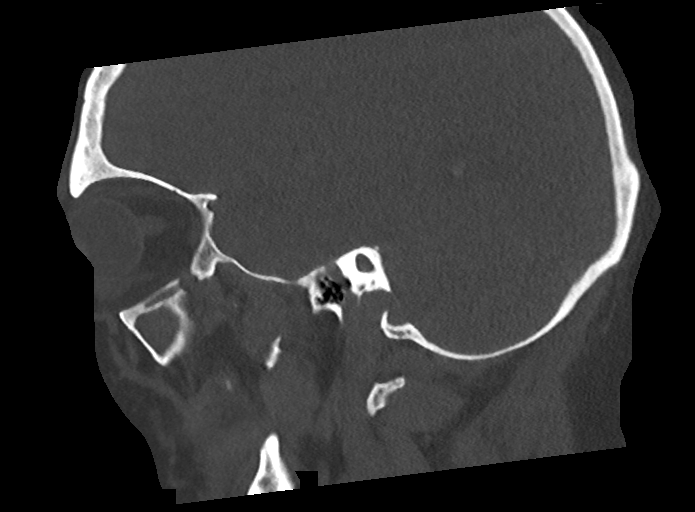

[14 of 47 positions shown; findings below may reference images not displayed]

FINDINGS: Paranasal sinuses:

Frontal: Subtotal opacification with superimposed chronic periosteal
thickening. This appears stable since 06/04/2019, progressed since
[REDACTED].

Ethmoid: Diffusely opacified and mildly expanded, stable since [REDACTED]
and progressed since [REDACTED].

Maxillary: Completely opacified with mild superimposed periosteal
thickening. The right maxillary sinus appears stable since [REDACTED],
the left has progressed.

Sphenoid: Subtotal opacification with combined mild periosteal
thickening and also chronic thinning/dehiscence at the floor of the
dominant left sphenoid sinus. This has progressed since [REDACTED].

Right ostiomeatal unit: Completely opacified.

Left ostiomeatal unit: Completely opacified.

Nasal passages: Completely opacified nasal cavity with chronic
thinning or dehiscence of the bony nasal septum. Associated subtotal
opacification of the nasopharynx which appears stable since [REDACTED],
substantially progressed since [REDACTED].

Anatomy:

Unchanged since the paranasal sinus CT 02/02/2018 (please see that
report).

Other: Stable visible noncontrast brain parenchyma. Calcified
atherosclerosis at the skull base.

No acute orbit or scalp soft tissue findings. Negative visible
noncontrast deep soft tissue spaces of the face.

Tympanic cavities and mastoids remain clear.

Port maxillary dentition, with prior dental extractions but residual
carious dentition.

Chronic severe thinning or early dehiscence of bone at the floor of
the sphenoid sinus as seen on sagittal image 47. This appears stable
since [REDACTED]. Other sinus walls have a more normal appearance.
IMPRESSION: 1. Subtotal nasal cavity, paranasal sinus, and nasopharynx
opacification as seen by MRI in [REDACTED]. This has substantially
progressed since prior paranasal sinus CT in [DATE]. Chronic thinning or dehiscence of the bony nasal septum and the
floor of the dominant left sphenoid sinus.

3. Hyperplastic sinuses, note paranasal sinus anatomy as described
on 02/02/2018 (please see that report).

## 2021-08-21 ENCOUNTER — Other Ambulatory Visit: Payer: Self-pay

## 2021-08-21 ENCOUNTER — Encounter (HOSPITAL_COMMUNITY): Payer: Self-pay

## 2021-08-21 ENCOUNTER — Emergency Department (HOSPITAL_COMMUNITY)
Admission: EM | Admit: 2021-08-21 | Discharge: 2021-08-21 | Disposition: A | Discharge status: Home / Self Care | Payer: Medicare Other | Attending: Emergency Medicine | Admitting: Emergency Medicine

## 2021-08-21 DIAGNOSIS — R569 Unspecified convulsions: Secondary | ICD-10-CM | POA: Insufficient documentation

## 2021-08-21 LAB — CBC WITH DIFFERENTIAL/PLATELET
Abs Immature Granulocytes: 0.02 10*3/uL (ref 0.00–0.07)
Basophils Absolute: 0 10*3/uL (ref 0.0–0.1)
Basophils Relative: 0 %
Eosinophils Absolute: 0.4 10*3/uL (ref 0.0–0.5)
Eosinophils Relative: 5 %
HCT: 29.1 % — ABNORMAL LOW (ref 39.0–52.0)
Hemoglobin: 9 g/dL — ABNORMAL LOW (ref 13.0–17.0)
Immature Granulocytes: 0 %
Lymphocytes Relative: 12 %
Lymphs Abs: 0.8 10*3/uL (ref 0.7–4.0)
MCH: 26.8 pg (ref 26.0–34.0)
MCHC: 30.9 g/dL (ref 30.0–36.0)
MCV: 86.6 fL (ref 80.0–100.0)
Monocytes Absolute: 0.6 10*3/uL (ref 0.1–1.0)
Monocytes Relative: 8 %
Neutro Abs: 5.2 10*3/uL (ref 1.7–7.7)
Neutrophils Relative %: 75 %
Platelets: 230 10*3/uL (ref 150–400)
RBC: 3.36 MIL/uL — ABNORMAL LOW (ref 4.22–5.81)
RDW: 14.2 % (ref 11.5–15.5)
WBC: 7 10*3/uL (ref 4.0–10.5)
nRBC: 0 % (ref 0.0–0.2)

## 2021-08-21 LAB — COMPREHENSIVE METABOLIC PANEL
ALT: 12 U/L (ref 0–44)
AST: 17 U/L (ref 15–41)
Albumin: 3.5 g/dL (ref 3.5–5.0)
Alkaline Phosphatase: 84 U/L (ref 38–126)
Anion gap: 7 (ref 5–15)
BUN: 14 mg/dL (ref 8–23)
CO2: 22 mmol/L (ref 22–32)
Calcium: 8.2 mg/dL — ABNORMAL LOW (ref 8.9–10.3)
Chloride: 110 mmol/L (ref 98–111)
Creatinine, Ser: 0.91 mg/dL (ref 0.61–1.24)
GFR, Estimated: >60 mL/min (ref >60)
Glucose, Bld: 105 mg/dL — ABNORMAL HIGH (ref 70–99)
Potassium: 3.6 mmol/L (ref 3.5–5.1)
Sodium: 139 mmol/L (ref 135–145)
Total Bilirubin: 0.5 mg/dL (ref 0.3–1.2)
Total Protein: 7 g/dL (ref 6.5–8.1)

## 2021-08-21 LAB — ETHANOL: Alcohol, Ethyl (B): <10 mg/dL (ref <10)

## 2021-08-21 MED ORDER — LEVETIRACETAM 500 MG PO TABS
500.0000 mg | ORAL_TABLET | Freq: Once | ORAL | Status: AC
Start: 2021-08-21 — End: 2021-08-21
  Administered 2021-08-21: 500 mg via ORAL
  Filled 2021-08-21: qty 1

## 2021-08-21 MED ORDER — LEVETIRACETAM 500 MG PO TABS: 500.0000 mg | ORAL_TABLET | Freq: Two times a day (BID) | ORAL | 2 refills | Status: AC

## 2021-08-21 MED ORDER — SODIUM CHLORIDE 0.9 % IV SOLN: INTRAVENOUS | Status: DC

## 2021-08-21 NOTE — ED Triage Notes (Signed)
BIBA from a bus for possible seizure, was foaming at mouth and slumped over, postictal when EMS arrived, denies drugs and alcohol. No hx seizures      160/74 CBG 120 20 LW GCS 14

## 2021-08-21 NOTE — ED Notes (Signed)
Pt ambulated in hallway.Pt O2 sats maintained at 97-99%.

## 2021-08-21 NOTE — ED Provider Notes (Signed)
[facility] DEPT Provider Note   CSN: [id number] Arrival date & time: 08/21/21  1916     History {Add pertinent medical, surgical, social history, OB history to HPI:1} Chief Complaint  Patient presents with   Seizures    Thomas Kline is a 71 y.o. male.  HPI Patient presents after being found on a bus with probable seizure.  History of same.  He takes Keppra.  Last admission for similar problem, January 2023.  Following that admission he was placed in nursing care facility for rehab.  Home Medications Prior to Admission medications   Medication Sig Start Date End Date Taking? Authorizing Provider  amLODipine (NORVASC) 10 MG tablet Take 1 tablet (10 mg total) by mouth daily. 03/28/21   [place] Donnamarie Poag, DO  ferrous sulfate 325 (65 FE) MG tablet Take 1 tablet (325 mg total) by mouth daily with breakfast. 08/06/19   Lattie Haw, MD  fluticasone (FLONASE) 50 MCG/ACT nasal spray Place 2 sprays into both nostrils daily as needed for allergies or rhinitis.    [provider]  levETIRAcetam (KEPPRA) 500 MG tablet Take 1 tablet (500 mg total) by mouth 2 (two) times daily. 03/20/21   Blanchie Dessert, MD  loratadine (CLARITIN) 10 MG tablet Take 1 tablet (10 mg total) by mouth daily. 10/23/17   Nuala Alpha, MD  montelukast (SINGULAIR) 10 MG tablet Take 1 tablet (10 mg total) by mouth at bedtime. 10/23/17   Nuala Alpha, MD  pantoprazole (PROTONIX) 20 MG tablet Take 1 tablet (20 mg total) by mouth 2 (two) times daily. 10/06/17   Nuala Alpha, MD  sodium chloride (OCEAN) 0.65 % SOLN nasal spray Place 1 spray into both nostrils as needed for congestion. 08/06/19   Wilber Oliphant, MD      Allergies    Patient has no known allergies.    Review of Systems   Review of Systems  Physical Exam Updated Vital Signs BP (!) 174/86   Pulse 79   Temp 98.2 F (36.8 C) (Oral)   Resp 17   Ht 6' (1.829 m)   Wt 86.2 kg   SpO2 98%   BMI 25.77 kg/m  Physical  Exam  ED Results / Procedures / Treatments   Labs (all labs ordered are listed, but only abnormal results are displayed) Labs Reviewed  COMPREHENSIVE METABOLIC PANEL - Abnormal; Notable for the following components:      Result Value   Glucose, Bld 105 (*)    Calcium 8.2 (*)    All other components within normal limits  CBC WITH DIFFERENTIAL/PLATELET - Abnormal; Notable for the following components:   RBC 3.36 (*)    Hemoglobin 9.0 (*)    HCT 29.1 (*)    All other components within normal limits  ETHANOL  LEVETIRACETAM LEVEL    EKG EKG Interpretation  Date/Time:  Tuesday August 21 2021 19:39:27 EDT Ventricular Rate:  88 PR Interval:  187 QRS Duration: 92 QT Interval:  389 QTC Calculation: 471 R Axis:   -35 Text Interpretation: Sinus rhythm Left axis deviation Abnormal R-wave progression, early transition since last tracing no significant change Confirmed by Daleen Bo [phone]) on 08/21/2021 9:11:16 PM  Radiology No results found.  Procedures Procedures  {Document cardiac monitor, telemetry assessment procedure when appropriate:1}  Medications Ordered in ED Medications  0.9 %  sodium chloride infusion ( Intravenous New Bag/Given 08/21/21 2030)    ED Course/ Medical Decision Making/ A&P  Medical Decision Making Patient presented the ED after being found by bystanders with probable seizure while on a public transportation vehicle.  History of same.  Patient states he is not taking his Keppra.  He denies heavy alcohol use recently.  Problems Addressed: Seizure [facility]): acute illness or injury with systemic symptoms that poses a threat to life or bodily functions    Details: Recurrent, currently not taking antiepileptic  Amount and/or Complexity of Data Reviewed Independent Historian:     Details: He is a cogent historian External Data Reviewed: notes.    Details: Prior hospitalization for altered mental status with seizure, with debilitation,  required nursing home placement. Labs: ordered.    Details: CBC, metabolic panel, alcohol level, Keppra level-initial findings normal except mild hypocalcemia, and stable anemia ECG/medicine tests: ordered and independent interpretation performed.    Details: Cardiac monitor-normal sinus rhythm   ***  {Document critical care time when appropriate:1} {Document review of labs and clinical decision tools ie heart score, Chads2Vasc2 etc:1}  {Document your independent review of radiology images, and any outside records:1} {Document your discussion with family members, caretakers, and with consultants:1} {Document social determinants of health affecting pt's care:1} {Document your decision making why or why not admission, treatments were needed:1} Final Clinical Impression(s) / ED Diagnoses Final diagnoses:  Seizure ([place])    Rx / DC Orders ED Discharge Orders     None

## 2021-08-21 NOTE — Discharge Instructions (Signed)
We sent a prescription for Keppra to your drugstore.  Please start taking it tomorrow morning.  Do not drive or drink alcohol, until you see your doctor for checkup.  Return here if needed for problems.

## 2021-08-23 LAB — LEVETIRACETAM LEVEL: Levetiracetam Lvl: <2 ug/mL — ABNORMAL LOW (ref 10.0–40.0)

## 2023-02-27 ENCOUNTER — Emergency Department (HOSPITAL_COMMUNITY): Payer: Self-pay

## 2023-02-27 ENCOUNTER — Emergency Department (HOSPITAL_COMMUNITY)
Admission: EM | Admit: 2023-02-27 | Discharge: 2023-02-28 | Disposition: A | Discharge status: Home / Self Care | Payer: Self-pay | Attending: Emergency Medicine | Admitting: Emergency Medicine

## 2023-02-27 ENCOUNTER — Other Ambulatory Visit: Payer: Self-pay

## 2023-02-27 DIAGNOSIS — I10 Essential (primary) hypertension: Secondary | ICD-10-CM | POA: Insufficient documentation

## 2023-02-27 DIAGNOSIS — R4182 Altered mental status, unspecified: Secondary | ICD-10-CM | POA: Insufficient documentation

## 2023-02-27 LAB — CBC WITH DIFFERENTIAL/PLATELET
Abs Immature Granulocytes: 0.01 10*3/uL (ref 0.00–0.07)
Basophils Absolute: 0 10*3/uL (ref 0.0–0.1)
Basophils Relative: 0 %
Eosinophils Absolute: 0.7 10*3/uL — ABNORMAL HIGH (ref 0.0–0.5)
Eosinophils Relative: 10 %
HCT: 33.3 % — ABNORMAL LOW (ref 39.0–52.0)
Hemoglobin: 9.7 g/dL — ABNORMAL LOW (ref 13.0–17.0)
Immature Granulocytes: 0 %
Lymphocytes Relative: 27 %
Lymphs Abs: 1.9 10*3/uL (ref 0.7–4.0)
MCH: 24.9 pg — ABNORMAL LOW (ref 26.0–34.0)
MCHC: 29.1 g/dL — ABNORMAL LOW (ref 30.0–36.0)
MCV: 85.4 fL (ref 80.0–100.0)
Monocytes Absolute: 0.7 10*3/uL (ref 0.1–1.0)
Monocytes Relative: 10 %
Neutro Abs: 3.7 10*3/uL (ref 1.7–7.7)
Neutrophils Relative %: 53 %
Platelets: 357 10*3/uL (ref 150–400)
RBC: 3.9 MIL/uL — ABNORMAL LOW (ref 4.22–5.81)
RDW: 14.3 % (ref 11.5–15.5)
WBC: 7.1 10*3/uL (ref 4.0–10.5)
nRBC: 0 % (ref 0.0–0.2)

## 2023-02-27 LAB — URINALYSIS, W/ REFLEX TO CULTURE (INFECTION SUSPECTED)
Bacteria, UA: NONE SEEN
Bilirubin Urine: NEGATIVE
Glucose, UA: NEGATIVE mg/dL
Hgb urine dipstick: NEGATIVE
Ketones, ur: NEGATIVE mg/dL
Leukocytes,Ua: NEGATIVE
Nitrite: NEGATIVE
Protein, ur: NEGATIVE mg/dL
Specific Gravity, Urine: 1.017 (ref 1.005–1.030)
pH: 7 (ref 5.0–8.0)

## 2023-02-27 LAB — COMPREHENSIVE METABOLIC PANEL
ALT: 16 U/L (ref 0–44)
AST: 20 U/L (ref 15–41)
Albumin: 3.8 g/dL (ref 3.5–5.0)
Alkaline Phosphatase: 75 U/L (ref 38–126)
Anion gap: 8 (ref 5–15)
BUN: 12 mg/dL (ref 8–23)
CO2: 23 mmol/L (ref 22–32)
Calcium: 9 mg/dL (ref 8.9–10.3)
Chloride: 108 mmol/L (ref 98–111)
Creatinine, Ser: 0.95 mg/dL (ref 0.61–1.24)
GFR, Estimated: >60 mL/min (ref >60)
Glucose, Bld: 123 mg/dL — ABNORMAL HIGH (ref 70–99)
Potassium: 3.8 mmol/L (ref 3.5–5.1)
Sodium: 139 mmol/L (ref 135–145)
Total Bilirubin: 0.5 mg/dL (ref <1.2)
Total Protein: 7.4 g/dL (ref 6.5–8.1)

## 2023-02-27 LAB — I-STAT CHEM 8, ED
BUN: 13 mg/dL (ref 8–23)
Calcium, Ion: 1.04 mmol/L — ABNORMAL LOW (ref 1.15–1.40)
Chloride: 109 mmol/L (ref 98–111)
Creatinine, Ser: 0.9 mg/dL (ref 0.61–1.24)
Glucose, Bld: 119 mg/dL — ABNORMAL HIGH (ref 70–99)
HCT: 32 % — ABNORMAL LOW (ref 39.0–52.0)
Hemoglobin: 10.9 g/dL — ABNORMAL LOW (ref 13.0–17.0)
Potassium: 3.8 mmol/L (ref 3.5–5.1)
Sodium: 140 mmol/L (ref 135–145)
TCO2: 21 mmol/L — ABNORMAL LOW (ref 22–32)

## 2023-02-27 LAB — ETHANOL: Alcohol, Ethyl (B): <10 mg/dL (ref <10)

## 2023-02-27 LAB — RAPID URINE DRUG SCREEN, HOSP PERFORMED
Amphetamines: NOT DETECTED
Barbiturates: NOT DETECTED
Benzodiazepines: NOT DETECTED
Cocaine: NOT DETECTED
Opiates: NOT DETECTED
Tetrahydrocannabinol: NOT DETECTED

## 2023-02-27 LAB — AMMONIA: Ammonia: 22 umol/L (ref 9–35)

## 2023-02-27 MED ORDER — AMLODIPINE BESYLATE 5 MG PO TABS
10.0000 mg | ORAL_TABLET | Freq: Once | ORAL | Status: AC
  Administered 2023-02-27: 10 mg via ORAL
  Filled 2023-02-27: qty 2

## 2023-02-27 MED ORDER — LEVETIRACETAM 500 MG PO TABS
500.0000 mg | ORAL_TABLET | Freq: Once | ORAL | Status: AC
  Administered 2023-02-27: 500 mg via ORAL
  Filled 2023-02-27: qty 1

## 2023-02-27 NOTE — ED Provider Notes (Signed)
Patient had nonspecific episode of behavioral change.  Evaluating diagnostic workup with CT head and labs.  Will reassess for discharge versus admission. Physical Exam  BP (!) 165/100   Pulse 69   Temp 98 F (36.7 C) (Oral)   Resp 15   Ht 6\' 1"  (1.854 m)   Wt 88.5 kg   SpO2 100%   BMI 25.73 kg/m   Physical Exam  Procedures  Procedures  ED Course / MDM   Clinical Course as of 02/27/23 2203  Thu Feb 27, 2023  2202 BP(!): 177/88 [MP]    Clinical Course User Index [MP] Arby Barrette, MD   Medical Decision Making Amount and/or Complexity of Data Reviewed Labs: ordered. Radiology: ordered.  Risk Prescription drug management.   Patient had delayed diagnostic evaluation.  Due to bedbugs, patient could not be taken to CT scan until he was decontaminated.  This resulted in significant delay in workup.  Patient has been up and ambulatory to the bathroom several times.  He is eaten a meal.  He appears to be functioning at a baseline in terms of neurologic function.  His vocal patterns are very difficult to understand but he repeats that he does not have anything really wrong with him this evening.  CT scan obtained and no acute findings.  Patient has extensive sinus polypoid disease.  I reviewed EMR and he has historically been treated for this by ENT.  This is not a new finding.  Patient has hypertension.  Blood pressures have elevated to 160s over 100.  At this time I will have his daily amlodipine administered 10 mg.  It is certainly likely the patient has had difficulty with compliance.  Will also administer Keppra 500 mg as he has history of seizure.  Will reinforce the importance of medication compliance.  At this time I do feel patient is stable for discharge back to his living facility.       Arby Barrette, MD 02/27/23 2206

## 2023-02-27 NOTE — ED Provider Notes (Signed)
[facility] EMERGENCY DEPARTMENT AT [facility] Provider Note   CSN: [id number] Arrival date & time: 02/27/23  1252     History  Chief Complaint  Patient presents with   Altered Mental Status    Thomas Kline is a 72 y.o. male.  HPI 72 year old male brought in for abnormal behavior.  Paramedic provides the initial history.  Patient originally asked for 911 to be called because he did not feel well.  He was wandering around which is not typical.  According to the housing place he lives then, he has some odd behavior at baseline but does not typically wander around.  He urinated and possibly defecated on himself according to EMS.  When EMS arrived he told them he felt okay and just wanted to go see his doctor.  Here, patient is hard to understand and follow.  He feels like he is here for a checkup and that is his time to come to the hospital for a checkup.  He denies any acute painful complaints.  He mentions alcohol use spontaneously, but then is hard to get a sense of how much he drinks or when the last time he really drink was.  Home Medications Prior to Admission medications   Medication Sig Start Date End Date Taking? Authorizing Provider  amLODipine (NORVASC) 10 MG tablet Take 1 tablet (10 mg total) by mouth daily. Patient not taking: Reported on 08/21/2021 03/28/21   [place], Eric J, DO  ferrous sulfate 325 (65 FE) MG tablet Take 1 tablet (325 mg total) by mouth daily with breakfast. Patient not taking: Reported on 08/21/2021 08/06/19   Cathleen Corti, MD  levETIRAcetam (KEPPRA) 500 MG tablet Take 1 tablet (500 mg total) by mouth 2 (two) times daily. 08/21/21   Mancel Bale, MD  loratadine (CLARITIN) 10 MG tablet Take 1 tablet (10 mg total) by mouth daily. Patient not taking: Reported on 08/21/2021 10/23/17   Arlyce Harman, MD  montelukast (SINGULAIR) 10 MG tablet Take 1 tablet (10 mg total) by mouth at bedtime. Patient not taking: Reported on 08/21/2021 10/23/17   Arlyce Harman, MD  pantoprazole (PROTONIX) 20 MG tablet Take 1 tablet (20 mg total) by mouth 2 (two) times daily. Patient not taking: Reported on 08/21/2021 10/06/17   Arlyce Harman, MD  sodium chloride (OCEAN) 0.65 % SOLN nasal spray Place 1 spray into both nostrils as needed for congestion. Patient not taking: Reported on 08/21/2021 08/06/19   Melene Plan, MD      Allergies    Patient has no known allergies.    Review of Systems   Review of Systems  Unable to perform ROS: Mental status change    Physical Exam Updated Vital Signs BP (!) 165/100   Pulse 69   Temp 98 F (36.7 C) (Oral)   Resp 15   Ht 6\' 1"  (1.854 m)   Wt 88.5 kg   SpO2 100%   BMI 25.73 kg/m  Physical Exam Vitals and nursing note reviewed.  Constitutional:      Appearance: He is well-developed. He is not diaphoretic.  HENT:     Head: Normocephalic and atraumatic.  Eyes:     Extraocular Movements: Extraocular movements intact.     Pupils: Pupils are equal, round, and reactive to light.  Cardiovascular:     Rate and Rhythm: Normal rate and regular rhythm.  Pulmonary:     Effort: Pulmonary effort is normal.  Abdominal:     General: There is no distension.  Palpations: Abdomen is soft.     Tenderness: There is no abdominal tenderness.  Skin:    General: Skin is warm and dry.  Neurological:     Mental Status: He is alert.     Comments: He knows he is in the hospital, though he feels like he is here for a checkup.  He knows it is December 12 but mentions it is 2025.  He seems to have equal strength in all 4 extremities but follows commands poorly.  Speech is hard to understand, unclear what his baseline is. He sometimes speaks in a way that the words don't make sense.     ED Results / Procedures / Treatments   Labs (all labs ordered are listed, but only abnormal results are displayed) Labs Reviewed  CBC WITH DIFFERENTIAL/PLATELET - Abnormal; Notable for the following components:      Result Value   RBC  3.90 (*)    Hemoglobin 9.7 (*)    HCT 33.3 (*)    MCH 24.9 (*)    MCHC 29.1 (*)    Eosinophils Absolute 0.7 (*)    All other components within normal limits  I-STAT CHEM 8, ED - Abnormal; Notable for the following components:   Glucose, Bld 119 (*)    Calcium, Ion 1.04 (*)    TCO2 21 (*)    Hemoglobin 10.9 (*)    HCT 32.0 (*)    All other components within normal limits  URINALYSIS, W/ REFLEX TO CULTURE (INFECTION SUSPECTED)  RAPID URINE DRUG SCREEN, HOSP PERFORMED  ETHANOL  COMPREHENSIVE METABOLIC PANEL  AMMONIA    EKG EKG Interpretation Date/Time:  Thursday February 27 2023 13:30:47 EST Ventricular Rate:  70 PR Interval:  177 QRS Duration:  100 QT Interval:  407 QTC Calculation: 440 R Axis:   -30  Text Interpretation: Sinus rhythm Abnormal R-wave progression, early transition Left ventricular hypertrophy no significant change since June 2023 Confirmed by Pricilla Loveless [phone]) on 02/27/2023 2:09:24 PM  Radiology DG Chest Portable 1 View Result Date: 02/27/2023 CLINICAL DATA:  Altered mental status EXAM: PORTABLE CHEST 1 VIEW COMPARISON:  03/22/2021 FINDINGS: Stable cardiomediastinal contours. Aortic atherosclerosis. No focal airspace consolidation, pleural effusion, or pneumothorax. IMPRESSION: No active disease. Electronically Signed   By: Duanne Guess D.O.   On: 02/27/2023 15:04    Procedures Procedures    Medications Ordered in ED Medications - No data to display  ED Course/ Medical Decision Making/ A&P                                 Medical Decision Making Amount and/or Complexity of Data Reviewed Labs: ordered. Radiology: ordered. ECG/medicine tests: ordered and independent interpretation performed.   Patient is hypertensive, but otherwise vitals are unremarkable. Unclear normal mental status. I tried calling the friend/emergency contact but there was no answer. Currently labs and CT head and urine are pending. Care transferred to Dr. Donnald Garre.          Final Clinical Impression(s) / ED Diagnoses Final diagnoses:  None    Rx / DC Orders ED Discharge Orders     None         Pricilla Loveless, MD 02/27/23 1642

## 2023-02-27 NOTE — ED Triage Notes (Signed)
Patient BIB [facility] from a [place], neighbors were concerned that patient was not acting himself, he was wondering around and also had incontinence urine and possible BM on himself which is abnormal for him. Patient is A/ox4 but wont directly answer questions which is normal for him according to EMS.

## 2023-02-27 NOTE — ED Notes (Signed)
This RN and a Radiation protection practitioner both tried two times to start IV with not success, IV team has been consulted

## 2023-02-27 NOTE — Discharge Instructions (Signed)
1.  Is very important you take your blood pressure medication.  Take amlodipine once daily every morning. 2.  Is very important you take your seizure medication.  Take Keppra 500 mg twice daily. 3.  See your doctor for recheck within the next week.  Continue to work with the ear nose throat specialist for your sinus polyps as needed.
